# Patient Record
Sex: Female | Born: 1990 | Race: White | Hispanic: No | Marital: Single | Smoking: Never smoker
Health system: Southern US, Community
[De-identification: ages and names within clinical notes are randomized; demographics above are authoritative.]

## PROBLEM LIST (undated history)

## (undated) ENCOUNTER — Inpatient Hospital Stay: Payer: Self-pay

## (undated) DIAGNOSIS — N83209 Unspecified ovarian cyst, unspecified side: Secondary | ICD-10-CM

## (undated) DIAGNOSIS — K219 Gastro-esophageal reflux disease without esophagitis: Secondary | ICD-10-CM

## (undated) DIAGNOSIS — D649 Anemia, unspecified: Secondary | ICD-10-CM

## (undated) DIAGNOSIS — S0291XA Unspecified fracture of skull, initial encounter for closed fracture: Secondary | ICD-10-CM

## (undated) DIAGNOSIS — Z8781 Personal history of (healed) traumatic fracture: Secondary | ICD-10-CM

## (undated) DIAGNOSIS — J45909 Unspecified asthma, uncomplicated: Secondary | ICD-10-CM

## (undated) HISTORY — PX: BIOPSY THYROID: PRO38

## (undated) HISTORY — PX: DENTAL SURGERY: SHX609

---
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
## 2009-06-18 ENCOUNTER — Emergency Department: Payer: Self-pay | Admitting: Emergency Medicine

## 2009-07-27 ENCOUNTER — Ambulatory Visit: Payer: Self-pay | Admitting: Family Medicine

## 2009-11-17 ENCOUNTER — Observation Stay: Payer: Self-pay

## 2009-12-05 ENCOUNTER — Inpatient Hospital Stay: Payer: Self-pay

## 2010-03-20 ENCOUNTER — Emergency Department (HOSPITAL_COMMUNITY): Admission: EM | Admit: 2010-03-20 | Discharge: 2010-03-20 | Payer: Self-pay | Admitting: Emergency Medicine

## 2010-04-17 ENCOUNTER — Emergency Department (HOSPITAL_COMMUNITY): Admission: EM | Admit: 2010-04-17 | Discharge: 2010-04-17 | Payer: Self-pay | Admitting: Emergency Medicine

## 2010-11-18 ENCOUNTER — Inpatient Hospital Stay (HOSPITAL_COMMUNITY)
Admission: AD | Admit: 2010-11-18 | Discharge: 2010-11-19 | Payer: Self-pay | Source: Home / Self Care | Attending: Obstetrics & Gynecology | Admitting: Obstetrics & Gynecology

## 2010-11-21 LAB — CBC
MCH: 30.7 pg (ref 26.0–34.0)
MCHC: 34 g/dL (ref 30.0–36.0)
MCV: 90.3 fL (ref 78.0–100.0)
Platelets: 162 10*3/uL (ref 150–400)
RBC: 3.71 MIL/uL — ABNORMAL LOW (ref 3.87–5.11)

## 2011-01-15 LAB — CBC
Platelets: 238 10*3/uL (ref 150–400)
RBC: 4.14 MIL/uL (ref 3.87–5.11)
WBC: 6.6 10*3/uL (ref 4.0–10.5)

## 2011-01-15 LAB — COMPREHENSIVE METABOLIC PANEL
ALT: 25 U/L (ref 0–35)
AST: 21 U/L (ref 0–37)
Albumin: 3.7 g/dL (ref 3.5–5.2)
Alkaline Phosphatase: 108 U/L (ref 39–117)
Chloride: 105 mEq/L (ref 96–112)
GFR calc Af Amer: >60 mL/min (ref >60)
Potassium: 3.8 mEq/L (ref 3.5–5.1)
Total Bilirubin: 0.5 mg/dL (ref 0.3–1.2)

## 2011-01-15 LAB — URINE MICROSCOPIC-ADD ON

## 2011-01-15 LAB — DIFFERENTIAL
Basophils Absolute: 0.1 10*3/uL (ref 0.0–0.1)
Basophils Relative: 1 % (ref 0–1)
Eosinophils Absolute: 0.1 10*3/uL (ref 0.0–0.7)
Eosinophils Relative: 1 % (ref 0–5)
Monocytes Absolute: 0.4 10*3/uL (ref 0.1–1.0)

## 2011-01-15 LAB — URINALYSIS, ROUTINE W REFLEX MICROSCOPIC
Bilirubin Urine: NEGATIVE
Glucose, UA: NEGATIVE mg/dL
Protein, ur: NEGATIVE mg/dL
Specific Gravity, Urine: 1.025 (ref 1.005–1.030)

## 2011-02-12 LAB — CBC
Hemoglobin: 13.3 g/dL (ref 12.0–15.0)
MCHC: 35.1 g/dL (ref 30.0–36.0)
RDW: 13.7 % (ref 11.5–15.5)

## 2011-02-12 LAB — COMPREHENSIVE METABOLIC PANEL
ALT: 13 U/L (ref 0–35)
Calcium: 8.9 mg/dL (ref 8.4–10.5)
Glucose, Bld: 102 mg/dL — ABNORMAL HIGH (ref 70–99)
Sodium: 137 mEq/L (ref 135–145)
Total Protein: 6.9 g/dL (ref 6.0–8.3)

## 2011-02-12 LAB — DIFFERENTIAL
Eosinophils Absolute: 0.2 10*3/uL (ref 0.0–0.7)
Lymphs Abs: 0.4 10*3/uL — ABNORMAL LOW (ref 0.7–4.0)
Monocytes Relative: 7 % (ref 3–12)
Neutro Abs: 2.6 10*3/uL (ref 1.7–7.7)
Neutrophils Relative %: 77 % (ref 43–77)

## 2011-11-22 ENCOUNTER — Emergency Department: Payer: Self-pay | Admitting: Emergency Medicine

## 2011-11-22 LAB — URINALYSIS, COMPLETE
Bacteria: NONE SEEN
WBC UR: 96 /HPF (ref 0–5)

## 2011-11-22 LAB — BASIC METABOLIC PANEL
Calcium, Total: 8.3 mg/dL — ABNORMAL LOW (ref 8.5–10.1)
EGFR (African American): >60
Glucose: 85 mg/dL (ref 65–99)
Osmolality: 285 (ref 275–301)
Potassium: 4 mmol/L (ref 3.5–5.1)

## 2011-11-22 LAB — HCG, QUANTITATIVE, PREGNANCY: Beta Hcg, Quant.: <1 m[IU]/mL — ABNORMAL LOW

## 2011-11-22 LAB — PREGNANCY, URINE: Pregnancy Test, Urine: NEGATIVE m[IU]/mL

## 2011-12-20 ENCOUNTER — Emergency Department: Payer: Self-pay | Admitting: Emergency Medicine

## 2012-03-07 ENCOUNTER — Encounter (HOSPITAL_COMMUNITY): Payer: Self-pay | Admitting: *Deleted

## 2012-03-07 ENCOUNTER — Emergency Department (HOSPITAL_COMMUNITY)
Admission: EM | Admit: 2012-03-07 | Discharge: 2012-03-08 | Disposition: A | Discharge status: Home / Self Care | Payer: Self-pay | Attending: Emergency Medicine | Admitting: Emergency Medicine

## 2012-03-07 DIAGNOSIS — N760 Acute vaginitis: Secondary | ICD-10-CM | POA: Insufficient documentation

## 2012-03-07 DIAGNOSIS — A499 Bacterial infection, unspecified: Secondary | ICD-10-CM | POA: Insufficient documentation

## 2012-03-07 DIAGNOSIS — N72 Inflammatory disease of cervix uteri: Secondary | ICD-10-CM | POA: Insufficient documentation

## 2012-03-07 DIAGNOSIS — B9689 Other specified bacterial agents as the cause of diseases classified elsewhere: Secondary | ICD-10-CM | POA: Insufficient documentation

## 2012-03-07 DIAGNOSIS — R112 Nausea with vomiting, unspecified: Secondary | ICD-10-CM | POA: Insufficient documentation

## 2012-03-07 HISTORY — DX: Anemia, unspecified: D64.9

## 2012-03-07 HISTORY — DX: Unspecified ovarian cyst, unspecified side: N83.209

## 2012-03-07 LAB — POCT PREGNANCY, URINE: Preg Test, Ur: NEGATIVE

## 2012-03-07 LAB — DIFFERENTIAL
Basophils Absolute: 0 10*3/uL (ref 0.0–0.1)
Eosinophils Absolute: 0.1 10*3/uL (ref 0.0–0.7)
Lymphocytes Relative: 40 % (ref 12–46)
Lymphs Abs: 2.2 10*3/uL (ref 0.7–4.0)
Neutrophils Relative %: 52 % (ref 43–77)

## 2012-03-07 LAB — BASIC METABOLIC PANEL
Calcium: 8.9 mg/dL (ref 8.4–10.5)
GFR calc non Af Amer: >90 mL/min (ref >90)
Sodium: 138 mEq/L (ref 135–145)

## 2012-03-07 LAB — URINALYSIS, ROUTINE W REFLEX MICROSCOPIC
Glucose, UA: NEGATIVE mg/dL
Hgb urine dipstick: NEGATIVE
Specific Gravity, Urine: 1.01 (ref 1.005–1.030)
pH: 7.5 (ref 5.0–8.0)

## 2012-03-07 LAB — WET PREP, GENITAL
Trich, Wet Prep: NONE SEEN
Yeast Wet Prep HPF POC: NONE SEEN

## 2012-03-07 LAB — CBC
Platelets: 184 10*3/uL (ref 150–400)
RBC: 4.06 MIL/uL (ref 3.87–5.11)
RDW: 14.7 % (ref 11.5–15.5)
WBC: 5.5 10*3/uL (ref 4.0–10.5)

## 2012-03-07 MED ORDER — OXYCODONE-ACETAMINOPHEN 5-325 MG PO TABS
2.0000 | ORAL_TABLET | Freq: Once | ORAL | Status: AC
  Administered 2012-03-07: 2 via ORAL
  Filled 2012-03-07: qty 2

## 2012-03-07 MED ORDER — ONDANSETRON 8 MG PO TBDP
8.0000 mg | ORAL_TABLET | Freq: Once | ORAL | Status: AC
  Administered 2012-03-07: 8 mg via ORAL
  Filled 2012-03-07: qty 1
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
## 2012-03-08 MED ORDER — AZITHROMYCIN 250 MG PO TABS
1000.0000 mg | ORAL_TABLET | Freq: Once | ORAL | Status: AC
  Administered 2012-03-08: 1000 mg via ORAL
  Filled 2012-03-08: qty 4

## 2012-03-08 MED ORDER — AZITHROMYCIN 250 MG PO TABS
ORAL_TABLET | ORAL | Status: AC
  Filled 2012-03-08: qty 1

## 2012-03-08 MED ORDER — CEFTRIAXONE SODIUM 250 MG IJ SOLR
250.0000 mg | Freq: Once | INTRAMUSCULAR | Status: AC
  Administered 2012-03-08: 250 mg via INTRAMUSCULAR
  Filled 2012-03-08: qty 250

## 2012-03-08 MED ORDER — LIDOCAINE HCL (PF) 1 % IJ SOLN
INTRAMUSCULAR | Status: AC
  Administered 2012-03-08: 01:00:00
  Filled 2012-03-08: qty 5

## 2012-03-08 MED ORDER — METRONIDAZOLE 500 MG PO TABS: 500.0000 mg | ORAL_TABLET | Freq: Two times a day (BID) | ORAL | Status: AC

## 2012-03-08 NOTE — Discharge Instructions (Signed)
Bacterial Vaginosis Bacterial vaginosis (BV) is a vaginal infection where the normal balance of bacteria in the vagina is disrupted. The normal balance is then replaced by an overgrowth of certain bacteria. There are several different kinds of bacteria that can cause BV. BV is the most common vaginal infection in women of childbearing age. CAUSES   The cause of BV is not fully understood. BV develops when there is an increase or imbalance of harmful bacteria.   Some activities or behaviors can upset the normal balance of bacteria in the vagina and put women at increased risk including:   Having a new sex partner or multiple sex partners.   Douching.   Using an intrauterine device (IUD) for contraception.   It is not clear what role sexual activity plays in the development of BV. However, women that have never had sexual intercourse are rarely infected with BV.  Women do not get BV from toilet seats, bedding, swimming pools or from touching objects around them.  SYMPTOMS   Grey vaginal discharge.   A fish-like odor with discharge, especially after sexual intercourse.   Itching or burning of the vagina and vulva.   Burning or pain with urination.   Some women have no signs or symptoms at all.  DIAGNOSIS  Your caregiver must examine the vagina for signs of BV. Your caregiver will perform lab tests and look at the sample of vaginal fluid through a microscope. They will look for bacteria and abnormal cells (clue cells), a pH test higher than 4.5, and a positive amine test all associated with BV.  RISKS AND COMPLICATIONS   Pelvic inflammatory disease (PID).   Infections following gynecology surgery.   Developing HIV.   Developing herpes virus.  TREATMENT  Sometimes BV will clear up without treatment. However, all women with symptoms of BV should be treated to avoid complications, especially if gynecology surgery is planned. Female partners generally do not need to be treated. However,  BV may spread between female sex partners so treatment is helpful in preventing a recurrence of BV.   BV may be treated with antibiotics. The antibiotics come in either pill or vaginal cream forms. Either can be used with nonpregnant or pregnant women, but the recommended dosages differ. These antibiotics are not harmful to the baby.   BV can recur after treatment. If this happens, a second round of antibiotics will often be prescribed.   Treatment is important for pregnant women. If not treated, BV can cause a premature delivery, especially for a pregnant woman who had a premature birth in the past. All pregnant women who have symptoms of BV should be checked and treated.   For chronic reoccurrence of BV, treatment with a type of prescribed gel vaginally twice a week is helpful.  HOME CARE INSTRUCTIONS   Finish all medication as directed by your caregiver.   Do not have sex until treatment is completed.   Tell your sexual partner that you have a vaginal infection. They should see their caregiver and be treated if they have problems, such as a mild rash or itching.   Practice safe sex. Use condoms. Only have 1 sex partner.  PREVENTION  Basic prevention steps can help reduce the risk of upsetting the natural balance of bacteria in the vagina and developing BV:  Do not have sexual intercourse (be abstinent).   Do not douche.   Use all of the medicine prescribed for treatment of BV, even if the signs and symptoms go away.     Tell your sex partner if you have BV. That way, they can be treated, if needed, to prevent reoccurrence.  SEEK MEDICAL CARE IF:   Your symptoms are not improving after 3 days of treatment.   You have increased discharge, pain, or fever.  MAKE SURE YOU:   Understand these instructions.   Will watch your condition.   Will get help right away if you are not doing well or get worse.  FOR MORE INFORMATION  Division of STD Prevention (DSTDP), Centers for Disease  Control and Prevention: [employer] American Social Health Association (ASHA): www.ashastd.org  Document Released: 10/15/2005 Document Revised: 10/04/2011 Document Reviewed: 04/07/2009 [facility] Patient Information 2012 [place], [place].Cervicitis Cervicitis is a soreness and swelling (inflammation) of the cervix. Your cervix is located at the bottom of your uterus which opens up to the vagina.  CAUSES   Sexually transmitted infections (STIs).   Allergic reaction.   Medicines or birth control devices that are put in the vagina.   Injury to the cervix.   Bacterial infections.  SYMPTOMS  There may be no symptoms. If symptoms occur, they may include:  Grey, white, yellow, or bad smelling vaginal discharge.   Pain or itching of the area outside the vagina.   Painful sexual intercourse.   Lower abdominal or lower back pain, especially during intercourse.   Frequent urination.   Abnormal vaginal bleeding between periods, after sexual intercourse, or after menopause.   Pressure or a heavy feeling in the pelvis.  DIAGNOSIS  Diagnosis is made after a pelvic exam. Other tests may include:  Examination of any discharge under a microscope (wet prep).   A Pap test.  TREATMENT  Treatment will depend on the cause of cervicitis. If it is caused by an STI, both you and your partner will need to be treated. Antibiotic medicines will be given. HOME CARE INSTRUCTIONS   Do not have sexual intercourse until your caregiver says it is okay.   Do not have sexual intercourse until your partner has been treated if your cervicitis is caused by an STI.   Take your antibiotics as directed. Finish them even if you start to feel better.  SEEK IMMEDIATE MEDICAL CARE IF:   Your symptoms come back.   You have a fever.   You experience any problems that may be related to the medicine you are taking.  MAKE SURE YOU:   Understand these instructions.   Will watch your condition.   Will get  help right away if you are not doing well or get worse.  Document Released: 10/15/2005 Document Revised: 10/04/2011 Document Reviewed: 05/14/2011 [facility] Patient Information 2012 [place], [place].   As discussed you may call here in 3-4 days to inquire about the results of your gonorrhea and Chlamydia cultures if you have not heard from [place].  You have been treated both of these infections tonight.  Complete the entire  Flagyl  Prescription which was prescribed to tonight for your bacterial vaginosis infection.
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
## 2012-03-08 NOTE — ED Provider Notes (Signed)
Medical screening examination/treatment/procedure(s) were conducted as a shared visit with non-physician practitioner(s) and myself.  I personally evaluated the patient during the encounter.  Pelvic exam per PA.  History and physical consistent with cervicitis and bacterial vaginosis  Donnetta Hutching, MD 03/08/12 1536

## 2012-03-09 LAB — RPR: RPR Ser Ql: NONREACTIVE

## 2012-03-10 LAB — GC/CHLAMYDIA PROBE AMP, GENITAL
Chlamydia, DNA Probe: NEGATIVE
GC Probe Amp, Genital: NEGATIVE

## 2012-08-25 ENCOUNTER — Emergency Department: Payer: Self-pay | Admitting: Emergency Medicine

## 2012-08-25 LAB — CBC
HCT: 34.9 % — ABNORMAL LOW (ref 35.0–47.0)
HGB: 12.2 g/dL (ref 12.0–16.0)
MCHC: 34.8 g/dL (ref 32.0–36.0)
RBC: 4.03 10*6/uL (ref 3.80–5.20)
WBC: 5.5 10*3/uL (ref 3.6–11.0)

## 2012-08-27 ENCOUNTER — Emergency Department: Payer: Self-pay | Admitting: Emergency Medicine

## 2012-08-27 LAB — CBC WITH DIFFERENTIAL/PLATELET
Basophil #: 0.1 10*3/uL (ref 0.0–0.1)
HCT: 34.7 % — ABNORMAL LOW (ref 35.0–47.0)
Lymphocyte #: 1.2 10*3/uL (ref 1.0–3.6)
MCH: 30.1 pg (ref 26.0–34.0)
MCV: 86 fL (ref 80–100)
Monocyte #: 0.4 x10 3/mm (ref 0.2–0.9)
Monocyte %: 9.9 %
Neutrophil #: 2.5 10*3/uL (ref 1.4–6.5)
Platelet: 176 10*3/uL (ref 150–440)
RDW: 14.5 % (ref 11.5–14.5)

## 2012-08-27 LAB — BASIC METABOLIC PANEL
EGFR (African American): >60
EGFR (Non-African Amer.): >60
Glucose: 103 mg/dL — ABNORMAL HIGH (ref 65–99)
Osmolality: 279 (ref 275–301)
Potassium: 3.4 mmol/L — ABNORMAL LOW (ref 3.5–5.1)

## 2013-02-18 ENCOUNTER — Emergency Department: Payer: Self-pay | Admitting: Emergency Medicine

## 2013-02-18 LAB — COMPREHENSIVE METABOLIC PANEL
BUN: 11 mg/dL (ref 7–18)
Bilirubin,Total: 0.7 mg/dL (ref 0.2–1.0)
Calcium, Total: 8.6 mg/dL (ref 8.5–10.1)
Chloride: 105 mmol/L (ref 98–107)
Co2: 30 mmol/L (ref 21–32)
EGFR (Non-African Amer.): >60
Osmolality: 279 (ref 275–301)
Potassium: 3.7 mmol/L (ref 3.5–5.1)
SGPT (ALT): 27 U/L (ref 12–78)

## 2013-02-18 LAB — CBC
HCT: 37.8 % (ref 35.0–47.0)
MCH: 29 pg (ref 26.0–34.0)
RBC: 4.38 10*6/uL (ref 3.80–5.20)

## 2013-02-18 LAB — URINALYSIS, COMPLETE
Bilirubin,UR: NEGATIVE
Ketone: NEGATIVE
Nitrite: NEGATIVE
RBC,UR: 10 /HPF (ref 0–5)
Squamous Epithelial: 8

## 2013-02-18 LAB — LIPASE, BLOOD: Lipase: 50 U/L — ABNORMAL LOW (ref 73–393)

## 2013-06-01 ENCOUNTER — Emergency Department: Payer: Self-pay | Admitting: Emergency Medicine

## 2013-06-15 ENCOUNTER — Emergency Department: Payer: Self-pay | Admitting: Emergency Medicine

## 2014-02-05 DIAGNOSIS — O99210 Obesity complicating pregnancy, unspecified trimester: Secondary | ICD-10-CM | POA: Insufficient documentation

## 2014-09-03 ENCOUNTER — Emergency Department: Payer: Self-pay | Admitting: Emergency Medicine

## 2014-09-03 LAB — CBC
HCT: 38 % (ref 35.0–47.0)
HGB: 12.7 g/dL (ref 12.0–16.0)
MCH: 29.7 pg (ref 26.0–34.0)
MCHC: 33.4 g/dL (ref 32.0–36.0)
MCV: 89 fL (ref 80–100)
PLATELETS: 211 10*3/uL (ref 150–440)
RBC: 4.27 10*6/uL (ref 3.80–5.20)
RDW: 13.7 % (ref 11.5–14.5)
WBC: 7.7 10*3/uL (ref 3.6–11.0)

## 2015-06-01 ENCOUNTER — Other Ambulatory Visit: Payer: Self-pay

## 2015-06-01 ENCOUNTER — Encounter: Payer: Self-pay | Admitting: Emergency Medicine

## 2015-06-01 ENCOUNTER — Emergency Department
Admission: EM | Admit: 2015-06-01 | Discharge: 2015-06-01 | Disposition: A | Discharge status: Home / Self Care | Payer: Medicaid Other | Attending: Emergency Medicine | Admitting: Emergency Medicine

## 2015-06-01 ENCOUNTER — Emergency Department: Payer: Medicaid Other

## 2015-06-01 DIAGNOSIS — R079 Chest pain, unspecified: Secondary | ICD-10-CM | POA: Insufficient documentation

## 2015-06-01 DIAGNOSIS — Z79899 Other long term (current) drug therapy: Secondary | ICD-10-CM | POA: Insufficient documentation

## 2015-06-01 DIAGNOSIS — Z880 Allergy status to penicillin: Secondary | ICD-10-CM | POA: Insufficient documentation

## 2015-06-01 DIAGNOSIS — K2970 Gastritis, unspecified, without bleeding: Secondary | ICD-10-CM | POA: Insufficient documentation

## 2015-06-01 LAB — CBC
HEMATOCRIT: 38.2 % (ref 35.0–47.0)
HEMOGLOBIN: 12.8 g/dL (ref 12.0–16.0)
MCH: 26.8 pg (ref 26.0–34.0)
MCHC: 33.6 g/dL (ref 32.0–36.0)
MCV: 79.7 fL — AB (ref 80.0–100.0)
Platelets: 234 10*3/uL (ref 150–440)
RBC: 4.79 MIL/uL (ref 3.80–5.20)
RDW: 15.9 % — ABNORMAL HIGH (ref 11.5–14.5)
WBC: 7.4 10*3/uL (ref 3.6–11.0)

## 2015-06-01 LAB — BASIC METABOLIC PANEL
ANION GAP: 8 (ref 5–15)
BUN: 15 mg/dL (ref 6–20)
CHLORIDE: 102 mmol/L (ref 101–111)
CO2: 27 mmol/L (ref 22–32)
Calcium: 8.3 mg/dL — ABNORMAL LOW (ref 8.9–10.3)
Creatinine, Ser: 0.87 mg/dL (ref 0.44–1.00)
GFR calc Af Amer: >60 mL/min (ref >60)
GLUCOSE: 94 mg/dL (ref 65–99)
Potassium: 3.8 mmol/L (ref 3.5–5.1)
SODIUM: 137 mmol/L (ref 135–145)

## 2015-06-01 LAB — FIBRIN DERIVATIVES D-DIMER (ARMC ONLY): Fibrin derivatives D-dimer (ARMC): 457 (ref 0–499)

## 2015-06-01 LAB — TROPONIN I: Troponin I: <0.03 ng/mL (ref <0.031)

## 2015-06-01 MED ORDER — RANITIDINE HCL 150 MG PO TABS
150.0000 mg | ORAL_TABLET | Freq: Two times a day (BID) | ORAL | Status: DC
Start: 2015-06-01 — End: 2019-08-27

## 2015-06-01 MED ORDER — GI COCKTAIL ~~LOC~~
ORAL | Status: AC
  Administered 2015-06-01: 30 mL via ORAL
  Filled 2015-06-01: qty 30

## 2015-06-01 MED ORDER — GI COCKTAIL ~~LOC~~
30.0000 mL | Freq: Once | ORAL | Status: AC
  Administered 2015-06-01: 30 mL via ORAL

## 2015-06-01 MED ORDER — SUCRALFATE 1 G PO TABS: 1.0000 g | ORAL_TABLET | Freq: Four times a day (QID) | ORAL | Status: DC

## 2015-06-01 NOTE — ED Notes (Signed)

## 2015-06-01 NOTE — ED Notes (Signed)
States she developed upper chest pain about 1 hr prior.Marland Kitchen Pos nausea. Pain increases with inspiration

## 2015-06-01 NOTE — ED Provider Notes (Signed)
[facility] Emergency Department Provider Note    ____________________________________________  Time seen: 9  I have reviewed the triage vital signs and the nursing notes.   HISTORY  Chief Complaint Chest Pain   History limited by: Not Limited   HPI Bianca Hayes is a 24 y.o. female who presents to the emergency department today because of concerns of chest pain. She states that the chest pain started all of a sudden today. She describes it as sharp. It started roughly 1 hour prior to arrival. It is located in the central chest. There is no radiation. She states it is worse with deep breaths. She feels slightly short of breath. She does not have a cough. No recent fevers.  Past Medical History  Diagnosis Date  . Ovarian cyst   . Anemia     There are no active problems to display for this patient.   History reviewed. No pertinent past surgical history.  Current Outpatient Rx  Name  Route  Sig  Dispense  Refill  . UNKNOWN TO PATIENT   Oral   Take 1 tablet by mouth daily.           Allergies Iodine; Bactrim; and Penicillins  No family history on file.  Social History History  Substance Use Topics  . Smoking status: Never Smoker   . Smokeless tobacco: Not on file  . Alcohol Use: No    Review of Systems   Constitutional: Negative for fever. Cardiovascular: Positive for chest pain. Respiratory: Positive for shortness of breath. Gastrointestinal: Negative for abdominal pain, vomiting and diarrhea. Genitourinary: Negative for dysuria. Musculoskeletal: Negative for back pain. Skin: Negative for rash. Neurological: Negative for headaches, focal weakness or numbness.  10-point ROS otherwise negative.  ____________________________________________   PHYSICAL EXAM:  VITAL SIGNS: ED Triage Vitals  Enc Vitals Group     BP 06/01/15 1825 126/76 mmHg     Pulse Rate 06/01/15 1825 100     Resp 06/01/15 1825 20     Temp  06/01/15 1825 98.2 F (36.8 C)     Temp Source 06/01/15 1825 Oral     SpO2 06/01/15 1825 100 %     Weight 06/01/15 1825 330 lb (149.687 kg)     Height 06/01/15 1825  (1.753 m)     Head Cir --      Peak Flow --      Pain Score 06/01/15 1822 9   Constitutional: Alert and oriented. Well appearing and in no distress. Eyes: Conjunctivae are normal. PERRL. Normal extraocular movements. ENT   Head: Normocephalic and atraumatic.   Nose: No congestion/rhinnorhea.   Mouth/Throat: Mucous membranes are moist.   Neck: No stridor. Hematological/Lymphatic/Immunilogical: No cervical lymphadenopathy. Cardiovascular: Normal rate, regular rhythm.  No murmurs, rubs, or gallops. Respiratory: Normal respiratory effort without tachypnea nor retractions. Breath sounds are clear and equal bilaterally. No wheezes/rales/rhonchi. Gastrointestinal: Soft and nontender. No distention. There is no CVA tenderness. Genitourinary: Deferred Musculoskeletal: Normal range of motion in all extremities. No joint effusions.  No lower extremity tenderness nor edema. Neurologic:  Normal speech and language. No gross focal neurologic deficits are appreciated. Speech is normal.  Skin:  Skin is warm, dry and intact. No rash noted. Psychiatric: Mood and affect are normal. Speech and behavior are normal. Patient exhibits appropriate insight and judgment.  ____________________________________________    LABS (pertinent positives/negatives)  Labs Reviewed  BASIC METABOLIC PANEL - Abnormal; Notable for the following:    Calcium 8.3 (*)  All other components within normal limits  CBC - Abnormal; Notable for the following:    MCV 79.7 (*)    RDW 15.9 (*)    All other components within normal limits  TROPONIN I  FIBRIN DERIVATIVES D-DIMER ([facility] ONLY)     ____________________________________________   EKG  I, Phineas Semen, attending physician, personally viewed and interpreted this EKG  EKG Time:  1823 Rate: 94 Rhythm: normal sinus rhythm Axis: normal Intervals: normal QRS: normal ST changes: no st elevation    ____________________________________________    RADIOLOGY  CXR IMPRESSION: No active cardiopulmonary disease.  ____________________________________________   PROCEDURES  Procedure(s) performed: None  Critical Care performed: No  ____________________________________________   INITIAL IMPRESSION / ASSESSMENT AND PLAN / ED COURSE  Pertinent labs & imaging results that were available during my care of the patient were reviewed by me and considered in my medical decision making (see chart for details).  Patient presented the emergency department today with acute onset of sharp chest pain. Blood work and EKG negative including d-dimer. Chest x-ray without any concerning findings. Patient did state that she felt relief after GI cocktail. Thus I think gastritis and reflux likely a discuss the patient's symptoms. Will plan on discharging home with medications.  ____________________________________________   FINAL CLINICAL IMPRESSION(S) / ED DIAGNOSES  Final diagnoses:  Chest pain, unspecified chest pain type  Gastritis     Phineas Semen, MD 06/01/15 2135

## 2015-06-01 NOTE — Discharge Instructions (Signed)
Please seek medical attention for any high fevers, chest pain, shortness of breath, change in behavior, persistent vomiting, bloody stool or any other new or concerning symptoms. ° °Gastritis, Adult °Gastritis is soreness and swelling (inflammation) of the lining of the stomach. Gastritis can develop as a sudden onset (acute) or long-term (chronic) condition. If gastritis is not treated, it can lead to stomach bleeding and ulcers. °CAUSES  °Gastritis occurs when the stomach lining is weak or damaged. Digestive juices from the stomach then inflame the weakened stomach lining. The stomach lining may be weak or damaged due to viral or bacterial infections. One common bacterial infection is the Helicobacter pylori infection. Gastritis can also result from excessive alcohol consumption, taking certain medicines, or having too much acid in the stomach.  °SYMPTOMS  °In some cases, there are no symptoms. When symptoms are present, they may include: °· Pain or a burning sensation in the upper abdomen. °· Nausea. °· Vomiting. °· An uncomfortable feeling of fullness after eating. °DIAGNOSIS  °Your caregiver may suspect you have gastritis based on your symptoms and a physical exam. To determine the cause of your gastritis, your caregiver may perform the following: °· Blood or stool tests to check for the H pylori bacterium. °· Gastroscopy. A thin, flexible tube (endoscope) is passed down the esophagus and into the stomach. The endoscope has a light and camera on the end. Your caregiver uses the endoscope to view the inside of the stomach. °· Taking a tissue sample (biopsy) from the stomach to examine under a microscope. °TREATMENT  °Depending on the cause of your gastritis, medicines may be prescribed. If you have a bacterial infection, such as an H pylori infection, antibiotics may be given. If your gastritis is caused by too much acid in the stomach, H2 blockers or antacids may be given. Your caregiver may recommend that you  stop taking aspirin, ibuprofen, or other nonsteroidal anti-inflammatory drugs (NSAIDs). °HOME CARE INSTRUCTIONS °· Only take over-the-counter or prescription medicines as directed by your caregiver. °· If you were given antibiotic medicines, take them as directed. Finish them even if you start to feel better. °· Drink enough fluids to keep your urine clear or pale yellow. °· Avoid foods and drinks that make your symptoms worse, such as: °¨ Caffeine or alcoholic drinks. °¨ Chocolate. °¨ Peppermint or mint flavorings. °¨ Garlic and onions. °¨ Spicy foods. °¨ Citrus fruits, such as oranges, lemons, or limes. °¨ Tomato-based foods such as sauce, chili, salsa, and pizza. °¨ Fried and fatty foods. °· Eat small, frequent meals instead of large meals. °SEEK IMMEDIATE MEDICAL CARE IF:  °· You have black or dark red stools. °· You vomit blood or material that looks like coffee grounds. °· You are unable to keep fluids down. °· Your abdominal pain gets worse. °· You have a fever. °· You do not feel better after 1 week. °· You have any other questions or concerns. °MAKE SURE YOU: °· Understand these instructions. °· Will watch your condition. °· Will get help right away if you are not doing well or get worse. °Document Released: 10/09/2001 Document Revised: 04/15/2012 Document Reviewed: 11/28/2011 °[employer]® Patient Information ©2015 [employer]. This information is not intended to replace advice given to you by your health care provider. Make sure you discuss any questions you have with your health care provider. ° °

## 2016-04-07 ENCOUNTER — Emergency Department
Admission: EM | Admit: 2016-04-07 | Discharge: 2016-04-07 | Disposition: A | Discharge status: Home / Self Care | Payer: Medicaid Other | Attending: Student | Admitting: Student

## 2016-04-07 ENCOUNTER — Emergency Department: Payer: Medicaid Other

## 2016-04-07 ENCOUNTER — Encounter: Payer: Self-pay | Admitting: Emergency Medicine

## 2016-04-07 DIAGNOSIS — R1110 Vomiting, unspecified: Secondary | ICD-10-CM

## 2016-04-07 DIAGNOSIS — Z79899 Other long term (current) drug therapy: Secondary | ICD-10-CM | POA: Insufficient documentation

## 2016-04-07 DIAGNOSIS — R112 Nausea with vomiting, unspecified: Secondary | ICD-10-CM | POA: Diagnosis not present

## 2016-04-07 DIAGNOSIS — R197 Diarrhea, unspecified: Secondary | ICD-10-CM | POA: Insufficient documentation

## 2016-04-07 DIAGNOSIS — N939 Abnormal uterine and vaginal bleeding, unspecified: Secondary | ICD-10-CM | POA: Insufficient documentation

## 2016-04-07 LAB — CBC WITH DIFFERENTIAL/PLATELET
BASOS ABS: 0.1 10*3/uL (ref 0–0.1)
BASOS PCT: 1 %
EOS PCT: 2 %
Eosinophils Absolute: 0.1 10*3/uL (ref 0–0.7)
HCT: 38 % (ref 35.0–47.0)
Hemoglobin: 12.7 g/dL (ref 12.0–16.0)
LYMPHS PCT: 28 %
Lymphs Abs: 1.9 10*3/uL (ref 1.0–3.6)
MCH: 27.7 pg (ref 26.0–34.0)
MCHC: 33.3 g/dL (ref 32.0–36.0)
MCV: 83.2 fL (ref 80.0–100.0)
Monocytes Absolute: 0.5 10*3/uL (ref 0.2–0.9)
Monocytes Relative: 8 %
Neutro Abs: 4.3 10*3/uL (ref 1.4–6.5)
Neutrophils Relative %: 61 %
PLATELETS: 214 10*3/uL (ref 150–440)
RBC: 4.57 MIL/uL (ref 3.80–5.20)
RDW: 15.6 % — ABNORMAL HIGH (ref 11.5–14.5)
WBC: 6.9 10*3/uL (ref 3.6–11.0)

## 2016-04-07 LAB — URINALYSIS COMPLETE WITH MICROSCOPIC (ARMC ONLY)
BILIRUBIN URINE: NEGATIVE
Bacteria, UA: NONE SEEN
Glucose, UA: NEGATIVE mg/dL
KETONES UR: NEGATIVE mg/dL
LEUKOCYTES UA: NEGATIVE
NITRITE: NEGATIVE
PH: 6 (ref 5.0–8.0)
PROTEIN: NEGATIVE mg/dL
SPECIFIC GRAVITY, URINE: 1.025 (ref 1.005–1.030)

## 2016-04-07 LAB — LIPASE, BLOOD: LIPASE: 17 U/L (ref 11–51)

## 2016-04-07 LAB — COMPREHENSIVE METABOLIC PANEL
ALT: 15 U/L (ref 14–54)
ANION GAP: 6 (ref 5–15)
AST: 15 U/L (ref 15–41)
Albumin: 4 g/dL (ref 3.5–5.0)
Alkaline Phosphatase: 88 U/L (ref 38–126)
BUN: 19 mg/dL (ref 6–20)
CHLORIDE: 108 mmol/L (ref 101–111)
CO2: 26 mmol/L (ref 22–32)
CREATININE: 0.79 mg/dL (ref 0.44–1.00)
Calcium: 8.5 mg/dL — ABNORMAL LOW (ref 8.9–10.3)
Glucose, Bld: 92 mg/dL (ref 65–99)
POTASSIUM: 3.4 mmol/L — AB (ref 3.5–5.1)
SODIUM: 140 mmol/L (ref 135–145)
Total Bilirubin: 0.5 mg/dL (ref 0.3–1.2)
Total Protein: 6.9 g/dL (ref 6.5–8.1)

## 2016-04-07 LAB — WET PREP, GENITAL
Clue Cells Wet Prep HPF POC: NONE SEEN
Sperm: NONE SEEN
Trich, Wet Prep: NONE SEEN
WBC, Wet Prep HPF POC: NONE SEEN
Yeast Wet Prep HPF POC: NONE SEEN

## 2016-04-07 LAB — HCG, QUANTITATIVE, PREGNANCY

## 2016-04-07 LAB — CHLAMYDIA/NGC RT PCR (ARMC ONLY)
Chlamydia Tr: NOT DETECTED
N gonorrhoeae: NOT DETECTED

## 2016-04-07 MED ORDER — ONDANSETRON HCL 4 MG/2ML IJ SOLN
4.0000 mg | Freq: Once | INTRAMUSCULAR | Status: AC
  Administered 2016-04-07: 4 mg via INTRAVENOUS
  Filled 2016-04-07: qty 2

## 2016-04-07 MED ORDER — SODIUM CHLORIDE 0.9 % IV BOLUS (SEPSIS)
1000.0000 mL | Freq: Once | INTRAVENOUS | Status: AC
  Administered 2016-04-07: 1000 mL via INTRAVENOUS

## 2016-04-07 NOTE — ED Notes (Signed)
Report given to April B, RN.  

## 2016-04-07 NOTE — ED Notes (Signed)
Nausea/vomiting on and off for about 3 weeks    Also is having heavy vaginal bleeding

## 2016-04-07 NOTE — ED Notes (Signed)
Pt unhooked so that she could go to the bathroom. Pt ambulatory without difficulty.

## 2016-04-07 NOTE — ED Notes (Signed)
Pt assited up to restroom to void. Pt appears in no acute distress. Call bell provided to right side.

## 2016-04-07 NOTE — ED Provider Notes (Signed)
[facility] Emergency Department Provider Note   ____________________________________________  Time seen: Approximately 5:35 PM  I have reviewed the triage vital signs and the nursing notes.   HISTORY  Chief Complaint Emesis    HPI Bianca Hayes is a 25 y.o. female with history of anemia and ovarian cysts who presents for evaluation of what she describes as heavy vaginal bleeding since yesterday, gradual onset, constant, moderate, no modifying factors. She reports that she has an Implanon and has not had a menstrual period in a year but she started having heavy vaginal bleeding yesterday. She also reports that over the past 2-3 weeks she has had vomiting which is been nonbloody and nonbilious. Initially she was seen in urgent care and diagnosed with a food borne illness however later on she developed cough, runny nose as well as sore throat and diarrhea. She reports that she is still having cough, intermittent vomiting and diarrhea. She has had some suprapubic abdominal pain/cramping, no dysuria.   Past Medical History  Diagnosis Date  . Ovarian cyst   . Anemia     There are no active problems to display for this patient.   History reviewed. No pertinent past surgical history.  Current Outpatient Rx  Name  Route  Sig  Dispense  Refill  . ranitidine (ZANTAC) 150 MG tablet   Oral   Take 1 tablet (150 mg total) by mouth 2 (two) times daily.   60 tablet   0   . sucralfate (CARAFATE) 1 G tablet   Oral   Take 1 tablet (1 g total) by mouth 4 (four) times daily.   60 tablet   0   . UNKNOWN TO PATIENT   Oral   Take 1 tablet by mouth daily.           Allergies Iodine; Bactrim; and Penicillins  No family history on file.  Social History Social History  Substance Use Topics  . Smoking status: Never Smoker   . Smokeless tobacco: None  . Alcohol Use: No    Review of Systems Constitutional: No fever/chills Eyes: No visual  changes. ENT: No sore throat. Cardiovascular: Denies chest pain. Respiratory: Denies shortness of breath. Gastrointestinal: + abdominal pain.  + nausea, + vomiting.  + diarrhea.  No constipation. Genitourinary: Negative for dysuria. Musculoskeletal: Negative for back pain. Skin: Negative for rash. Neurological: Negative for headaches, focal weakness or numbness.  10-point ROS otherwise negative.  ____________________________________________   PHYSICAL EXAM:  Filed Vitals:   04/07/16 1701 04/07/16 1829 04/07/16 1830  BP: 126/75 120/75 119/86  Pulse: 103 91 88  Temp: 98.6 F (37 C)    TempSrc: Oral    Resp: 20 20   Height: 5\' 8"  (1.727 m)    Weight: 330 lb (149.687 kg)    SpO2: 100% 100% 100%    VITAL SIGNS: ED Triage Vitals  Enc Vitals Group     BP 04/07/16 1701 126/75 mmHg     Pulse Rate 04/07/16 1701 103     Resp 04/07/16 1701 20     Temp 04/07/16 1701 98.6 F (37 C)     Temp Source 04/07/16 1701 Oral     SpO2 04/07/16 1701 100 %     Weight 04/07/16 1701 330 lb (149.687 kg)     Height 04/07/16 1701 5\' 8"  (1.727 m)     Head Cir --      Peak Flow --      Pain Score 04/07/16 1701 9  Pain Loc --      Pain Edu? --      Excl. in GC? --     Constitutional: Alert and oriented. Well appearing and in no acute distress. +frequent cough. Eyes: Conjunctivae are normal. PERRL. EOMI. Head: Atraumatic. Nose: No congestion/rhinnorhea. Mouth/Throat: Mucous membranes are moist.  Oropharynx non-erythematous, No exudates, no peritonsillar abscess or uvular deviation. Neck: No stridor.  Supple without meningismus. Cardiovascular: Normal rate, regular rhythm. Grossly normal heart sounds.  Good peripheral circulation. Respiratory: Normal respiratory effort.  No retractions. Lungs CTAB. Gastrointestinal: Soft and nontender. No distention. No CVA tenderness. Pelvic: tiny amount of blood tinged fluid in the vaginal vault, no active bleeding. Musculoskeletal: No lower extremity  tenderness nor edema.  No joint effusions. Neurologic:  Normal speech and language. No gross focal neurologic deficits are appreciated. No gait instability. Skin:  Skin is warm, dry and intact. No rash noted. Psychiatric: Mood and affect are normal. Speech and behavior are normal.  ____________________________________________   LABS (all labs ordered are listed, but only abnormal results are displayed)  Labs Reviewed  CBC WITH DIFFERENTIAL/PLATELET - Abnormal; Notable for the following:    RDW 15.6 (*)    All other components within normal limits  COMPREHENSIVE METABOLIC PANEL - Abnormal; Notable for the following:    Potassium 3.4 (*)    Calcium 8.5 (*)    All other components within normal limits  URINALYSIS COMPLETEWITH MICROSCOPIC ([facility] ONLY) - Abnormal; Notable for the following:    Color, Urine YELLOW (*)    APPearance HAZY (*)    Hgb urine dipstick 3+ (*)    Squamous Epithelial / LPF 0-5 (*)    All other components within normal limits  WET PREP, GENITAL  CHLAMYDIA/NGC RT PCR ([facility] ONLY)  LIPASE, BLOOD  HCG, QUANTITATIVE, PREGNANCY   ____________________________________________  EKG  none ____________________________________________  RADIOLOGY  CXR IMPRESSION: No active cardiopulmonary disease. ____________________________________________   PROCEDURES  Procedure(s) performed: None  Critical Care performed: No  ____________________________________________   INITIAL IMPRESSION / ASSESSMENT AND PLAN / ED COURSE  Pertinent labs & imaging results that were available during my care of the patient were reviewed by me and considered in my medical decision making (see chart for details).  Bianca Hayes is a 25 y.o. female with history of anemia and ovarian cysts who presents for evaluation of what she describes as heavy vaginal bleeding since yesterday. This is her primary complaint. She has also had cough, nausea vomiting and diarrhea over the  past week. On exam, she is well-appearing and in no acute distress. She was mildly tachycardic on arrival however this has resolved at the time assessment. The remainder of her vital signs are stable and she is afebrile. She has a benign abdominal examination. I doubt any acute life-threatening intra-abdominal process in this very well-appearing patient. We'll obtain screening labs, give IV fluids and antiemetics, obtain chest x-ray, reassess for disposition.  ----------------------------------------- 8:17 PM on 04/07/2016 ----------------------------------------- CBC and CMP unremarkable, normal lipase. Negative pregnancy test. Urinalysis with multiple red blood cells as expected given that she is complaining of vaginal bleeding. Her pelvic exam shows very tiny amount of blood-tinged vaginal discharge, no active bleeding. Negative wet prep. GC, the attending. She is tolerating by mouth intake without vomiting in the emergency department. We discussed return precautions, need for close PCP and GYN follow-up and she is comfortable with the discharge plan. DC home.  ____________________________________________   FINAL CLINICAL IMPRESSION(S) / ED DIAGNOSES  Final diagnoses:  Vaginal  bleeding  Vomiting and diarrhea      NEW MEDICATIONS STARTED DURING THIS VISIT:  New Prescriptions   No medications on file     Note:  This document was prepared using Dragon voice recognition software and may include unintentional dictation errors.    Gayla Doss, MD 04/07/16 2029

## 2016-04-07 NOTE — ED Notes (Signed)
Bianca Hayes, ed tech assisted md gayle with pelvic exam.

## 2016-04-09 LAB — URINE CULTURE

## 2017-08-07 ENCOUNTER — Emergency Department: Payer: No Typology Code available for payment source

## 2017-08-07 ENCOUNTER — Emergency Department
Admission: EM | Admit: 2017-08-07 | Discharge: 2017-08-07 | Disposition: A | Discharge status: Home / Self Care | Payer: No Typology Code available for payment source | Attending: Emergency Medicine | Admitting: Emergency Medicine

## 2017-08-07 ENCOUNTER — Telehealth: Payer: Self-pay

## 2017-08-07 DIAGNOSIS — Z79899 Other long term (current) drug therapy: Secondary | ICD-10-CM | POA: Insufficient documentation

## 2017-08-07 DIAGNOSIS — Y939 Activity, unspecified: Secondary | ICD-10-CM | POA: Insufficient documentation

## 2017-08-07 DIAGNOSIS — Y999 Unspecified external cause status: Secondary | ICD-10-CM | POA: Diagnosis not present

## 2017-08-07 DIAGNOSIS — S02113A Unspecified occipital condyle fracture, initial encounter for closed fracture: Secondary | ICD-10-CM | POA: Insufficient documentation

## 2017-08-07 DIAGNOSIS — Y92410 Unspecified street and highway as the place of occurrence of the external cause: Secondary | ICD-10-CM | POA: Insufficient documentation

## 2017-08-07 DIAGNOSIS — R51 Headache: Secondary | ICD-10-CM | POA: Insufficient documentation

## 2017-08-07 DIAGNOSIS — S0990XA Unspecified injury of head, initial encounter: Secondary | ICD-10-CM | POA: Diagnosis present

## 2017-08-07 DIAGNOSIS — M542 Cervicalgia: Secondary | ICD-10-CM | POA: Diagnosis not present

## 2017-08-07 DIAGNOSIS — R519 Headache, unspecified: Secondary | ICD-10-CM

## 2017-08-07 LAB — POCT PREGNANCY, URINE: Preg Test, Ur: NEGATIVE

## 2017-08-07 MED ORDER — OXYCODONE-ACETAMINOPHEN 5-325 MG PO TABS
2.0000 | ORAL_TABLET | Freq: Once | ORAL | Status: AC
  Administered 2017-08-07: 1 via ORAL
  Filled 2017-08-07: qty 2

## 2017-08-07 MED ORDER — OXYCODONE-ACETAMINOPHEN 5-325 MG PO TABS: 1.0000 | ORAL_TABLET | Freq: Four times a day (QID) | ORAL | 0 refills | Status: DC | PRN
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
## 2017-08-07 NOTE — ED Notes (Addendum)
Mother and patient given drink and snacks. Asked patient to keep walking to minimum until brace placed on patient. Pt was up and walking in room.
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
## 2017-08-07 NOTE — ED Notes (Signed)
Pt alert and oriented X4, active, cooperative, pt in NAD. RR even and unlabored, color WNL.  Pt family informed to return with patient if any life threatening symptoms occur. Discharge and followup instructions reviewed.  

## 2017-08-07 NOTE — Care Management Note (Signed)
Case Management Note  Patient Details  Name: Bianca Hayes MRN: [id number] Date of Birth: 10/20/1991  Subjective/Objective:     Called [employer] regarding [place] "J" collar for the patient. They open at 8:30am per the answering service. I have left a referral for the collar and it will go to the tech on call immediately. Left my number for callback.                Action/Plan:   Expected Discharge Date:                  Expected Discharge Plan:     In-House Referral:     Discharge planning Services     Post Acute Care Choice:    Choice offered to:     DME Arranged:    DME Agency:     HH Arranged:    HH Agency:     Status of Service:     If discussed at [employer] of Stay Meetings, dates discussed:    Additional Comments:  Berna Bue, RN 08/07/2017, 8:10 AM

## 2017-08-07 NOTE — ED Provider Notes (Signed)
note [place] J's are not available any longer feel anything we can put her in as an aspirin patient's been placed in an [place] collar will follow-up and do everything else as planned.   Arnaldo Natal, MD 08/07/17 [phone]
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
## 2017-08-07 NOTE — Discharge Instructions (Signed)
Please follow up with Neurosurgery. Please keep on your hard collar except for in showers

## 2017-08-07 NOTE — ED Notes (Signed)
Pt resting in bed, appears comfortable. c collar in place.

## 2017-08-07 NOTE — ED Notes (Signed)
Call to CT/Radiology with urine pregnancy results

## 2017-08-07 NOTE — ED Notes (Signed)
Pt awake and alert. Awaiting for biotech to come and place brace on patient.

## 2017-12-28 ENCOUNTER — Emergency Department
Admission: EM | Admit: 2017-12-28 | Discharge: 2017-12-28 | Disposition: A | Discharge status: Home / Self Care | Payer: Self-pay | Attending: Emergency Medicine | Admitting: Emergency Medicine

## 2017-12-28 ENCOUNTER — Encounter: Payer: Self-pay | Admitting: Emergency Medicine

## 2017-12-28 ENCOUNTER — Other Ambulatory Visit: Payer: Self-pay

## 2017-12-28 DIAGNOSIS — K029 Dental caries, unspecified: Secondary | ICD-10-CM | POA: Insufficient documentation

## 2017-12-28 MED ORDER — ONDANSETRON HCL 8 MG PO TABS: 8.0000 mg | ORAL_TABLET | Freq: Three times a day (TID) | ORAL | 0 refills | Status: DC | PRN

## 2017-12-28 MED ORDER — OXYCODONE-ACETAMINOPHEN 5-325 MG PO TABS: 1.0000 | ORAL_TABLET | ORAL | 0 refills | Status: DC | PRN

## 2017-12-28 NOTE — ED Notes (Signed)
Pt discharged to home.  Family member driving.  Discharge instructions reviewed.  Verbalized understanding.  No questions or concerns at this time.  Teach back verified.  Pt in NAD.  No items left in ED.   

## 2017-12-28 NOTE — ED Triage Notes (Signed)
Lower R dental pain x 2 weeks.

## 2017-12-28 NOTE — ED Provider Notes (Signed)
[facility] Emergency Department Provider Note   ____________________________________________   First MD Initiated Contact with Patient 12/28/17 1604     (approximate)  I have reviewed the triage vital signs and the nursing notes.   HISTORY  Chief Complaint Dental Pain    HPI Bianca Hayes is a 27 y.o. female patient complained of dental pain for 2 weeks.  Patient saw dentist 2 days ago was placed on antibiotics and he states they cannot pull her teeth at this time.  Patient states the antibiotic "clindamycin" is causing nausea.  Patient states the anti-inflammatory medication provided only mild relief for her dental pain.  Past Medical History:  Diagnosis Date  . Anemia   . Ovarian cyst     There are no active problems to display for this patient.   History reviewed. No pertinent surgical history.  Prior to Admission medications   Medication Sig Start Date End Date Taking? Authorizing Provider  ondansetron (ZOFRAN) 8 MG tablet Take 1 tablet (8 mg total) by mouth every 8 (eight) hours as needed for nausea or vomiting. 12/28/17   Joni Reining, PA-C  oxyCODONE-acetaminophen (PERCOCET) 5-325 MG tablet Take 1 tablet by mouth every 4 (four) hours as needed for severe pain. 12/28/17   Joni Reining, PA-C  oxyCODONE-acetaminophen (ROXICET) 5-325 MG tablet Take 1 tablet by mouth every 6 (six) hours as needed. 08/07/17   Rebecka Apley, MD  ranitidine (ZANTAC) 150 MG tablet Take 1 tablet (150 mg total) by mouth 2 (two) times daily. 06/01/15 05/31/16  Phineas Semen, MD  sucralfate (CARAFATE) 1 G tablet Take 1 tablet (1 g total) by mouth 4 (four) times daily. 06/01/15 05/31/16  Phineas Semen, MD  UNKNOWN TO PATIENT Take 1 tablet by mouth daily.    [provider]    Allergies Iodine; Bactrim [sulfamethoxazole-trimethoprim]; and Penicillins  No family history on file.  Social History Social History   Tobacco Use  . Smoking status: Never  Smoker  . Smokeless tobacco: Never Used  Substance Use Topics  . Alcohol use: No  . Drug use: No    Review of Systems Constitutional: No fever/chills Eyes: No visual changes. ENT: No sore throat.  Dental pain Cardiovascular: Denies chest pain. Respiratory: Denies shortness of breath. Gastrointestinal: No abdominal pain.  Nausea and vomiting secondary to antibiotic.  No diarrhea.  No constipation. Genitourinary: Negative for dysuria. Musculoskeletal: Negative for back pain. Skin: Negative for rash. Neurological: Negative for headaches, focal weakness or numbness. Allergic/Immunilogical: See medication list. ____________________________________________   PHYSICAL EXAM:  VITAL SIGNS: ED Triage Vitals  Enc Vitals Group     BP 12/28/17 1522 (!) 165/92     Pulse Rate 12/28/17 1522 85     Resp 12/28/17 1522 18     Temp 12/28/17 1522 98.4 F (36.9 C)     Temp Source 12/28/17 1522 Oral     SpO2 12/28/17 1522 97 %     Weight 12/28/17 1523 300 lb (136.1 kg)     Height 12/28/17 1523 5\' 9"  (1.753 m)     Head Circumference --      Peak Flow --      Pain Score 12/28/17 1522 10     Pain Loc --      Pain Edu? --      Excl. in GC? --    Constitutional: Alert and oriented. Well appearing and in no acute distress. Eyes: Conjunctivae are normal. PERRL. EOMI. Head: Atraumatic. Nose: No congestion/rhinnorhea. Mouth/Throat:  Mucous membranes are moist.  Oropharynx non-erythematous.  Dental caries #27-29. Neck: No stridor.   Hematological/Lymphatic/Immunilogical: No cervical lymphadenopathy. Cardiovascular: Normal rate, regular rhythm. Grossly normal heart sounds.  Good peripheral circulation.  Elevated blood pressure Respiratory: Normal respiratory effort.  No retractions. Lungs CTAB. Gastrointestinal: Soft and nontender. No distention. No abdominal bruits. No CVA tenderness. Skin:  Skin is warm, dry and intact. No rash noted. Psychiatric: Mood and affect are normal. Speech and behavior  are normal.  ____________________________________________   LABS (all labs ordered are listed, but only abnormal results are displayed)  Labs Reviewed - No data to display ____________________________________________  EKG   ____________________________________________  RADIOLOGY  ED MD interpretation:    Official radiology report(s): No results found.  ____________________________________________   PROCEDURES  Procedure(s) performed: None  Procedures  Critical Care performed: No  ____________________________________________   INITIAL IMPRESSION / ASSESSMENT AND PLAN / ED COURSE  As part of my medical decision making, I reviewed the following data within the electronic MEDICAL RECORD NUMBER Notes from prior ED visits   Dental pain secondary to caries.  Advised patient continue antibiotics as directed.  Patient given a prescription for Zofran and Percocet.  Patient advised to follow-up with scheduled dental appointment.  Patient can return to work note.     ____________________________________________   FINAL CLINICAL IMPRESSION(S) / ED DIAGNOSES  Final diagnoses:  None     ED Discharge Orders        Ordered    oxyCODONE-acetaminophen (PERCOCET) 5-325 MG tablet  Every 4 hours PRN     12/28/17 1613    ondansetron (ZOFRAN) 8 MG tablet  Every 8 hours PRN     12/28/17 1613       Note:  This document was prepared using Dragon voice recognition software and may include unintentional dictation errors.    Joni ReiningSmith, Carmalita Wakefield K, PA-C 12/28/17 1616    Phineas SemenGoodman, Graydon, MD 12/28/17 2004

## 2017-12-28 NOTE — ED Notes (Signed)
Pt ambulatory to room.  Complaining of dental pain.  Pt is A&Ox4 in NAD.

## 2018-01-05 ENCOUNTER — Emergency Department
Admission: EM | Admit: 2018-01-05 | Discharge: 2018-01-05 | Disposition: A | Discharge status: Home / Self Care | Payer: Self-pay | Attending: Emergency Medicine | Admitting: Emergency Medicine

## 2018-01-05 ENCOUNTER — Other Ambulatory Visit: Payer: Self-pay

## 2018-01-05 DIAGNOSIS — W540XXA Bitten by dog, initial encounter: Secondary | ICD-10-CM | POA: Insufficient documentation

## 2018-01-05 DIAGNOSIS — Y929 Unspecified place or not applicable: Secondary | ICD-10-CM | POA: Insufficient documentation

## 2018-01-05 DIAGNOSIS — Z23 Encounter for immunization: Secondary | ICD-10-CM | POA: Insufficient documentation

## 2018-01-05 DIAGNOSIS — Y9389 Activity, other specified: Secondary | ICD-10-CM | POA: Insufficient documentation

## 2018-01-05 DIAGNOSIS — S61452A Open bite of left hand, initial encounter: Secondary | ICD-10-CM | POA: Insufficient documentation

## 2018-01-05 DIAGNOSIS — Y999 Unspecified external cause status: Secondary | ICD-10-CM | POA: Insufficient documentation

## 2018-01-05 MED ORDER — DOXYCYCLINE HYCLATE 100 MG PO CAPS: 100.0000 mg | ORAL_CAPSULE | Freq: Two times a day (BID) | ORAL | 0 refills | Status: DC

## 2018-01-05 MED ORDER — TETANUS-DIPHTH-ACELL PERTUSSIS 5-2.5-18.5 LF-MCG/0.5 IM SUSP
0.5000 mL | Freq: Once | INTRAMUSCULAR | Status: AC
Start: 2018-01-05 — End: 2018-01-05
  Administered 2018-01-05: 0.5 mL via INTRAMUSCULAR
  Filled 2018-01-05: qty 0.5
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
## 2018-01-05 NOTE — ED Triage Notes (Signed)
Pt states dog bit yesterday to bilat hands. States not pt's dog but knows who the dog belongs to. States person told pt that dog is UTD on shots but pt states "I don't believe her." no bleeding at this time.
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
## 2018-03-29 ENCOUNTER — Emergency Department: Payer: Medicaid Other

## 2018-03-29 ENCOUNTER — Other Ambulatory Visit: Payer: Self-pay

## 2018-03-29 ENCOUNTER — Emergency Department
Admission: EM | Admit: 2018-03-29 | Discharge: 2018-03-29 | Disposition: A | Discharge status: Home / Self Care | Payer: Medicaid Other | Attending: Emergency Medicine | Admitting: Emergency Medicine

## 2018-03-29 ENCOUNTER — Encounter: Payer: Self-pay | Admitting: Emergency Medicine

## 2018-03-29 DIAGNOSIS — K529 Noninfective gastroenteritis and colitis, unspecified: Secondary | ICD-10-CM

## 2018-03-29 DIAGNOSIS — R109 Unspecified abdominal pain: Secondary | ICD-10-CM | POA: Diagnosis present

## 2018-03-29 DIAGNOSIS — K5289 Other specified noninfective gastroenteritis and colitis: Secondary | ICD-10-CM | POA: Diagnosis not present

## 2018-03-29 LAB — URINALYSIS, COMPLETE (UACMP) WITH MICROSCOPIC
Bilirubin Urine: NEGATIVE
GLUCOSE, UA: NEGATIVE mg/dL
Ketones, ur: NEGATIVE mg/dL
Leukocytes, UA: NEGATIVE
Nitrite: NEGATIVE
PH: 5 (ref 5.0–8.0)
Protein, ur: NEGATIVE mg/dL
SPECIFIC GRAVITY, URINE: 1.02 (ref 1.005–1.030)

## 2018-03-29 LAB — CBC
HCT: 37.6 % (ref 35.0–47.0)
HEMOGLOBIN: 12.6 g/dL (ref 12.0–16.0)
MCH: 26.5 pg (ref 26.0–34.0)
MCHC: 33.6 g/dL (ref 32.0–36.0)
MCV: 78.8 fL — AB (ref 80.0–100.0)
Platelets: 226 10*3/uL (ref 150–440)
RBC: 4.77 MIL/uL (ref 3.80–5.20)
RDW: 15.8 % — ABNORMAL HIGH (ref 11.5–14.5)
WBC: 6.5 10*3/uL (ref 3.6–11.0)

## 2018-03-29 LAB — COMPREHENSIVE METABOLIC PANEL
ALBUMIN: 3.7 g/dL (ref 3.5–5.0)
ALK PHOS: 85 U/L (ref 38–126)
ALT: 19 U/L (ref 14–54)
ANION GAP: 7 (ref 5–15)
AST: 20 U/L (ref 15–41)
BILIRUBIN TOTAL: 0.6 mg/dL (ref 0.3–1.2)
BUN: 13 mg/dL (ref 6–20)
CALCIUM: 8 mg/dL — AB (ref 8.9–10.3)
CO2: 21 mmol/L — ABNORMAL LOW (ref 22–32)
Chloride: 110 mmol/L (ref 101–111)
Creatinine, Ser: 0.54 mg/dL (ref 0.44–1.00)
GFR calc Af Amer: >60 mL/min (ref >60)
GFR calc non Af Amer: >60 mL/min (ref >60)
GLUCOSE: 122 mg/dL — AB (ref 65–99)
Potassium: 3.5 mmol/L (ref 3.5–5.1)
Sodium: 138 mmol/L (ref 135–145)
TOTAL PROTEIN: 6.7 g/dL (ref 6.5–8.1)

## 2018-03-29 LAB — LIPASE, BLOOD: Lipase: 22 U/L (ref 11–51)

## 2018-03-29 LAB — POCT PREGNANCY, URINE: Preg Test, Ur: NEGATIVE

## 2018-03-29 MED ORDER — KETOROLAC TROMETHAMINE 30 MG/ML IJ SOLN
30.0000 mg | Freq: Once | INTRAMUSCULAR | Status: AC
  Administered 2018-03-29: 30 mg via INTRAVENOUS
  Filled 2018-03-29: qty 1

## 2018-03-29 MED ORDER — MORPHINE SULFATE (PF) 4 MG/ML IV SOLN
4.0000 mg | Freq: Once | INTRAVENOUS | Status: AC
Start: 2018-03-29 — End: 2018-03-29
  Administered 2018-03-29: 4 mg via INTRAVENOUS
  Filled 2018-03-29: qty 1

## 2018-03-29 MED ORDER — DICYCLOMINE HCL 20 MG PO TABS: 20.0000 mg | ORAL_TABLET | Freq: Three times a day (TID) | ORAL | 0 refills | Status: DC | PRN

## 2018-03-29 MED ORDER — ONDANSETRON HCL 4 MG/2ML IJ SOLN
4.0000 mg | Freq: Once | INTRAMUSCULAR | Status: AC
  Administered 2018-03-29: 4 mg via INTRAVENOUS
  Filled 2018-03-29: qty 2

## 2018-03-29 MED ORDER — PROMETHAZINE HCL 25 MG/ML IJ SOLN
25.0000 mg | Freq: Once | INTRAMUSCULAR | Status: AC
  Administered 2018-03-29: 25 mg via INTRAVENOUS
  Filled 2018-03-29: qty 1

## 2018-03-29 MED ORDER — SODIUM CHLORIDE 0.9 % IV SOLN
Freq: Once | INTRAVENOUS | Status: AC
  Administered 2018-03-29: 19:00:00 via INTRAVENOUS

## 2018-03-29 MED ORDER — ONDANSETRON 4 MG PO TBDP: 4.0000 mg | ORAL_TABLET | Freq: Three times a day (TID) | ORAL | 0 refills | Status: DC | PRN

## 2018-03-29 MED ORDER — PANTOPRAZOLE SODIUM 40 MG PO TBEC: 40.0000 mg | DELAYED_RELEASE_TABLET | Freq: Every day | ORAL | 1 refills | Status: DC

## 2018-03-29 NOTE — ED Provider Notes (Signed)
[facility] Emergency Department Provider Note       Time seen: ----------------------------------------- 7:02 PM on 03/29/2018 -----------------------------------------   I have reviewed the triage vital signs and the nursing notes.  HISTORY   Chief Complaint Abdominal Pain    HPI Bianca Hayes is a 27 y.o. female with a history of anemia and ovarian cysts who presents to the ED for nausea, vomiting and abdominal pain since yesterday.  Patient describes diffuse mid abdominal pain.  Nothing makes it better or worse.  Patient reports a intermittent history of this in the past, she feels like she has vomited 20 times in the last 24 hours.  Past Medical History:  Diagnosis Date  . Anemia   . Ovarian cyst     There are no active problems to display for this patient.   Past Surgical History:  Procedure Laterality Date  . DENTAL SURGERY      Allergies Iodine; Bactrim [sulfamethoxazole-trimethoprim]; and Penicillins  Social History Social History   Tobacco Use  . Smoking status: Never Smoker  . Smokeless tobacco: Never Used  Substance Use Topics  . Alcohol use: No  . Drug use: No   Review of Systems Constitutional: Negative for fever. Cardiovascular: Negative for chest pain. Respiratory: Negative for shortness of breath. Gastrointestinal: Positive for abdominal pain, vomiting and diarrhea Genitourinary: Negative for dysuria. Musculoskeletal: Negative for back pain. Skin: Negative for rash. Neurological: Negative for headaches, focal weakness or numbness.  All systems negative/normal/unremarkable except as stated in the HPI  ____________________________________________   PHYSICAL EXAM:  VITAL SIGNS: ED Triage Vitals  Enc Vitals Group     BP 03/29/18 1806 (!) 162/83     Pulse Rate 03/29/18 1806 92     Resp 03/29/18 1806 18     Temp 03/29/18 1806 98.6 F (37 C)     Temp Source 03/29/18 1806 Oral     SpO2 03/29/18 1806 100 %      Weight 03/29/18 1806 (!) 330 lb (149.7 kg)     Height 03/29/18 1806 5\' 9"  (1.753 m)     Head Circumference --      Peak Flow --      Pain Score 03/29/18 1808 10     Pain Loc --      Pain Edu? --      Excl. in GC? --    Constitutional: Alert and oriented. Well appearing and in no distress.  Obese Eyes: Conjunctivae are normal. Normal extraocular movements. ENT   Head: Normocephalic and atraumatic.   Nose: No congestion/rhinnorhea.   Mouth/Throat: Mucous membranes are moist.   Neck: No stridor. Cardiovascular: Normal rate, regular rhythm. No murmurs, rubs, or gallops. Respiratory: Normal respiratory effort without tachypnea nor retractions. Breath sounds are clear and equal bilaterally. No wheezes/rales/rhonchi. Gastrointestinal: Soft and nontender. Normal bowel sounds Musculoskeletal: Nontender with normal range of motion in extremities. No lower extremity tenderness nor edema. Neurologic:  Normal speech and language. No gross focal neurologic deficits are appreciated.  Skin:  Skin is warm, dry and intact. No rash noted. Psychiatric: Mood and affect are normal. Speech and behavior are normal.   ____________________________________________  ED COURSE:  As part of my medical decision making, I reviewed the following data within the electronic MEDICAL RECORD NUMBER History obtained from family if available, nursing notes, old chart and ekg, as well as notes from prior ED visits. Patient presented for abdominal pain, vomiting and diarrhea, we will assess with labs and imaging as indicated at this  time.   Procedures ____________________________________________   LABS (pertinent positives/negatives)  Labs Reviewed  COMPREHENSIVE METABOLIC PANEL - Abnormal; Notable for the following components:      Result Value   CO2 21 (*)    Glucose, Bld 122 (*)    Calcium 8.0 (*)    All other components within normal limits  URINALYSIS, COMPLETE (UACMP) WITH MICROSCOPIC - Abnormal;  Notable for the following components:   Color, Urine YELLOW (*)    APPearance HAZY (*)    Hgb urine dipstick SMALL (*)    Bacteria, UA RARE (*)    All other components within normal limits  CBC - Abnormal; Notable for the following components:   MCV 78.8 (*)    RDW 15.8 (*)    All other components within normal limits  LIPASE, BLOOD  POC URINE PREG, ED  POCT PREGNANCY, URINE    RADIOLOGY Images were viewed by me  Abdomen 2 view is unremarkable  ____________________________________________  DIFFERENTIAL DIAGNOSIS   Gastroenteritis, dehydration, electrolyte abnormality  FINAL ASSESSMENT AND PLAN  Gastroenteritis   Plan: The patient had presented for vomiting and diarrhea. Patient's labs are reassuring. Patient's imaging was also negative.  She is in no distress at this time, is feeling better.  She is cleared for outpatient follow-up with GI.  Ulice Dash, MD   Note: This note was generated in part or whole with voice recognition software. Voice recognition is usually quite accurate but there are transcription errors that can and very often do occur. I apologize for any typographical errors that were not detected and corrected.     Emily Filbert, MD 03/29/18 Ebony Cargo

## 2018-03-29 NOTE — ED Triage Notes (Signed)
Nausea, vomiting and abd pain since yesterday.

## 2018-03-29 NOTE — ED Notes (Signed)
Pt returned from xray

## 2018-03-31 ENCOUNTER — Telehealth: Payer: Self-pay | Admitting: Gastroenterology

## 2018-03-31 NOTE — Telephone Encounter (Signed)
Left vm for pt to call office and schedule FU APT

## 2018-04-30 ENCOUNTER — Ambulatory Visit: Payer: Medicaid Other | Admitting: Gastroenterology

## 2018-04-30 ENCOUNTER — Encounter: Payer: Self-pay | Admitting: *Deleted

## 2018-07-08 ENCOUNTER — Encounter: Payer: Self-pay | Admitting: Emergency Medicine

## 2018-07-08 ENCOUNTER — Emergency Department
Admission: EM | Admit: 2018-07-08 | Discharge: 2018-07-08 | Disposition: A | Discharge status: Home / Self Care | Payer: Medicaid Other | Attending: Emergency Medicine | Admitting: Emergency Medicine

## 2018-07-08 DIAGNOSIS — S30860A Insect bite (nonvenomous) of lower back and pelvis, initial encounter: Secondary | ICD-10-CM | POA: Insufficient documentation

## 2018-07-08 DIAGNOSIS — Y929 Unspecified place or not applicable: Secondary | ICD-10-CM | POA: Insufficient documentation

## 2018-07-08 DIAGNOSIS — W57XXXA Bitten or stung by nonvenomous insect and other nonvenomous arthropods, initial encounter: Secondary | ICD-10-CM | POA: Insufficient documentation

## 2018-07-08 DIAGNOSIS — R1110 Vomiting, unspecified: Secondary | ICD-10-CM | POA: Diagnosis not present

## 2018-07-08 DIAGNOSIS — Z79899 Other long term (current) drug therapy: Secondary | ICD-10-CM | POA: Diagnosis not present

## 2018-07-08 DIAGNOSIS — Y999 Unspecified external cause status: Secondary | ICD-10-CM | POA: Insufficient documentation

## 2018-07-08 DIAGNOSIS — Y939 Activity, unspecified: Secondary | ICD-10-CM | POA: Insufficient documentation

## 2018-07-08 MED ORDER — ONDANSETRON HCL 4 MG PO TABS: 4.0000 mg | ORAL_TABLET | Freq: Three times a day (TID) | ORAL | 1 refills | Status: AC | PRN

## 2018-07-08 NOTE — ED Notes (Addendum)
States she got stung by a horsefly or yellow jacket on butt and then states she started feeling bad at work tonight with abd cramping and nausea

## 2018-07-08 NOTE — ED Triage Notes (Signed)
Pt reports she was stung by yellow jacket on Monday and has had nausea and "doesn't feel good." Insect bite on the left buttocks.

## 2018-07-08 NOTE — ED Provider Notes (Signed)
Bianca Hayes  ____________________________________________  Time seen: Approximately 9:28 PM  I have reviewed the triage vital signs and the nursing notes.   HISTORY  Chief Complaint Insect Bite    HPI CHARMANE MENNEN is a 27 y.o. female presents to the emergency department after a yellowjacket stung her on the left buttocks last night.  Patient reports that she has not felt well since insect stung her.  She has had 2 episodes of emesis.  No shortness of breath, cough, chest tightness, abdominal pain or syncope.  No prior episodes of anaphylaxis.  Patient does not have a rash in any other locations of her body other than buttocks.  Patient reports that her symptoms have kept her from staying at work.  No alleviating measures of been attempted.   Past Medical History:  Diagnosis Date  . Anemia   . Ovarian cyst     There are no active problems to display for this patient.   Past Surgical History:  Procedure Laterality Date  . DENTAL SURGERY      Prior to Admission medications   Medication Sig Start Date End Date Taking? Authorizing Provider  dicyclomine (BENTYL) 20 MG tablet Take 1 tablet (20 mg total) by mouth 3 (three) times daily as needed for spasms. 03/29/18   Emily Filbert, MD  doxycycline (VIBRAMYCIN) 100 MG capsule Take 1 capsule (100 mg total) by mouth 2 (two) times daily. 01/05/18   Fisher, Roselyn Bering, PA-C  ondansetron (ZOFRAN ODT) 4 MG disintegrating tablet Take 1 tablet (4 mg total) by mouth every 8 (eight) hours as needed for nausea or vomiting. 03/29/18   Emily Filbert, MD  ondansetron (ZOFRAN) 4 MG tablet Take 1 tablet (4 mg total) by mouth every 8 (eight) hours as needed for up to 3 days for nausea or vomiting. 07/08/18 07/11/18  Orvil Feil, PA-C  oxyCODONE-acetaminophen (PERCOCET) 5-325 MG tablet Take 1 tablet by mouth every 4 (four) hours as needed for severe pain. 12/28/17   Joni Reining,  PA-C  oxyCODONE-acetaminophen (ROXICET) 5-325 MG tablet Take 1 tablet by mouth every 6 (six) hours as needed. 08/07/17   Rebecka Apley, MD  pantoprazole (PROTONIX) 40 MG tablet Take 1 tablet (40 mg total) by mouth daily. 03/29/18 03/29/19  Emily Filbert, MD  ranitidine (ZANTAC) 150 MG tablet Take 1 tablet (150 mg total) by mouth 2 (two) times daily. 06/01/15 05/31/16  Phineas Semen, MD  sucralfate (CARAFATE) 1 G tablet Take 1 tablet (1 g total) by mouth 4 (four) times daily. 06/01/15 05/31/16  Phineas Semen, MD  UNKNOWN TO PATIENT Take 1 tablet by mouth daily.    [provider]    Allergies Iodine; Bactrim [sulfamethoxazole-trimethoprim]; and Penicillins  History reviewed. No pertinent family history.  Social History Social History   Tobacco Use  . Smoking status: Never Smoker  . Smokeless tobacco: Never Used  Substance Use Topics  . Alcohol use: No  . Drug use: No     Review of Systems  Constitutional: No fever/chills Eyes: No visual changes. No discharge ENT: No upper respiratory complaints. Cardiovascular: no chest pain. Respiratory: no cough. No SOB. Gastrointestinal: No abdominal pain.  No nausea, no vomiting.  No diarrhea.  No constipation. Musculoskeletal: Negative for musculoskeletal pain. Skin: Patient has insect bite.  Neurological: Negative for headaches, focal weakness or numbness.   ____________________________________________   PHYSICAL EXAM:  VITAL SIGNS: ED Triage Vitals  Enc Vitals Group  BP 07/08/18 2008 (!) 134/98     Pulse Rate 07/08/18 2008 100     Resp 07/08/18 2008 18     Temp 07/08/18 2008 99.1 F (37.3 C)     Temp Source 07/08/18 2008 Oral     SpO2 07/08/18 2008 100 %     Weight --      Height --      Head Circumference --      Peak Flow --      Pain Score 07/08/18 2117 7     Pain Loc --      Pain Edu? --      Excl. in GC? --      Constitutional: Alert and oriented. Well appearing and in no acute  distress. Eyes: Conjunctivae are normal. PERRL. EOMI. Head: Atraumatic. ENT: Cardiovascular: Normal rate, regular rhythm. Normal S1 and S2.  Good peripheral circulation. Respiratory: Normal respiratory effort without tachypnea or retractions. Lungs CTAB. Good air entry to the bases with no decreased or absent breath sounds. Musculoskeletal: Full range of motion to all extremities. No gross deformities appreciated. Neurologic:  Normal speech and language. No gross focal neurologic deficits are appreciated.  Skin: Patient has small region of local erythema at left buttocks, pinpoint in size.  No surrounding cellulitis or palpable induration or fluctuance. Psychiatric: Mood and affect are normal. Speech and behavior are normal. Patient exhibits appropriate insight and judgement.   ____________________________________________   LABS (all labs ordered are listed, but only abnormal results are displayed)  Labs Reviewed - No data to display ____________________________________________  EKG   ____________________________________________  RADIOLOGY  No results found.  ____________________________________________    PROCEDURES  Procedure(s) performed:    Procedures    Medications - No data to display   ____________________________________________   INITIAL IMPRESSION / ASSESSMENT AND PLAN / ED COURSE  Pertinent labs & imaging results that were available during my care of the patient were reviewed by me and considered in my medical decision making (see chart for details).  Review of the [place] CSRS was performed in accordance of the [facility] prior to dispensing any controlled drugs.    Assessment and plan Insect bite Patient presents to the emergency department after a yellowjacket stung her.  Patient has experienced vomiting since incident occurred.  Overall physical exam was reassuring without cellulitis.  No concern for abscess at this time.  Patient was discharged with a  short course of Zofran.  She was advised to follow-up with primary care as needed.  All patient questions were answered.     ____________________________________________  FINAL CLINICAL IMPRESSION(S) / ED DIAGNOSES  Final diagnoses:  Insect bite, unspecified site, initial encounter      NEW MEDICATIONS STARTED DURING THIS VISIT:  ED Discharge Orders         Ordered    ondansetron (ZOFRAN) 4 MG tablet  Every 8 hours PRN     07/08/18 2111              This chart was dictated using voice recognition software/Dragon. Despite best efforts to proofread, errors can occur which can change the meaning. Any change was purely unintentional.    Orvil Feil, PA-C 07/08/18 2132    Nita Sickle, MD 07/08/18 2320

## 2018-08-09 ENCOUNTER — Other Ambulatory Visit: Payer: Self-pay

## 2018-08-09 ENCOUNTER — Emergency Department
Admission: EM | Admit: 2018-08-09 | Discharge: 2018-08-10 | Disposition: A | Discharge status: Home / Self Care | Payer: Medicaid Other | Attending: Emergency Medicine | Admitting: Emergency Medicine

## 2018-08-09 DIAGNOSIS — Z79899 Other long term (current) drug therapy: Secondary | ICD-10-CM | POA: Diagnosis not present

## 2018-08-09 DIAGNOSIS — K529 Noninfective gastroenteritis and colitis, unspecified: Secondary | ICD-10-CM | POA: Diagnosis not present

## 2018-08-09 DIAGNOSIS — R112 Nausea with vomiting, unspecified: Secondary | ICD-10-CM | POA: Diagnosis present

## 2018-08-09 LAB — COMPREHENSIVE METABOLIC PANEL
ALT: 22 U/L (ref 0–44)
AST: 22 U/L (ref 15–41)
Albumin: 4.1 g/dL (ref 3.5–5.0)
Alkaline Phosphatase: 95 U/L (ref 38–126)
Anion gap: 10 (ref 5–15)
BILIRUBIN TOTAL: 0.7 mg/dL (ref 0.3–1.2)
BUN: 15 mg/dL (ref 6–20)
CHLORIDE: 105 mmol/L (ref 98–111)
CO2: 27 mmol/L (ref 22–32)
Calcium: 8.7 mg/dL — ABNORMAL LOW (ref 8.9–10.3)
Creatinine, Ser: 0.73 mg/dL (ref 0.44–1.00)
Glucose, Bld: 117 mg/dL — ABNORMAL HIGH (ref 70–99)
Potassium: 3.4 mmol/L — ABNORMAL LOW (ref 3.5–5.1)
Sodium: 142 mmol/L (ref 135–145)
TOTAL PROTEIN: 7.1 g/dL (ref 6.5–8.1)

## 2018-08-09 LAB — CBC
HEMATOCRIT: 37.8 % (ref 36.0–46.0)
HEMOGLOBIN: 12.2 g/dL (ref 12.0–15.0)
MCH: 26.7 pg (ref 26.0–34.0)
MCHC: 32.3 g/dL (ref 30.0–36.0)
MCV: 82.7 fL (ref 80.0–100.0)
PLATELETS: 239 10*3/uL (ref 150–400)
RBC: 4.57 MIL/uL (ref 3.87–5.11)
RDW: 14.7 % (ref 11.5–15.5)
WBC: 7.3 10*3/uL (ref 4.0–10.5)
nRBC: 0 % (ref 0.0–0.2)

## 2018-08-09 LAB — POCT PREGNANCY, URINE: PREG TEST UR: NEGATIVE

## 2018-08-09 LAB — LIPASE, BLOOD: LIPASE: 21 U/L (ref 11–51)

## 2018-08-09 MED ORDER — ONDANSETRON 4 MG PO TBDP: 4.0000 mg | ORAL_TABLET | Freq: Three times a day (TID) | ORAL | 0 refills | Status: DC | PRN

## 2018-08-09 MED ORDER — ONDANSETRON HCL 4 MG/2ML IJ SOLN
4.0000 mg | Freq: Once | INTRAMUSCULAR | Status: AC
  Administered 2018-08-09: 4 mg via INTRAVENOUS
  Filled 2018-08-09: qty 2

## 2018-08-09 MED ORDER — SODIUM CHLORIDE 0.9 % IV SOLN
1000.0000 mL | Freq: Once | INTRAVENOUS | Status: AC
  Administered 2018-08-09: 1000 mL via INTRAVENOUS

## 2018-08-09 MED ORDER — ONDANSETRON HCL 4 MG/2ML IJ SOLN
4.0000 mg | Freq: Once | INTRAMUSCULAR | Status: AC
  Administered 2018-08-09: 4 mg via INTRAVENOUS

## 2018-08-09 NOTE — ED Triage Notes (Signed)
Patient reports having abdominal pain, hot/cold flashes, vomiting and diarrhea for several days.

## 2018-08-09 NOTE — ED Provider Notes (Signed)
[facility] Emergency Department Provider Note   ____________________________________________    I have reviewed the triage vital signs and the nursing notes.   HISTORY  Chief Complaint Abdominal Pain     HPI Bianca Hayes is a 27 y.o. female who presents with complaints of nausea vomiting and diarrhea x3 days.  Patient reports intermittent diffuse abdominal cramping as well.  Denies fevers or chills.  No sick contacts reported.  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  Has not taken anything for this.  No recent travel.  Vomitus is nonbilious nonbloody.  Positive myalgias.   Past Medical History:  Diagnosis Date  . Anemia   . Ovarian cyst     There are no active problems to display for this patient.   Past Surgical History:  Procedure Laterality Date  . DENTAL SURGERY      Prior to Admission medications   Medication Sig Start Date End Date Taking? Authorizing Provider  dicyclomine (BENTYL) 20 MG tablet Take 1 tablet (20 mg total) by mouth 3 (three) times daily as needed for spasms. 03/29/18   Emily Filbert, MD  doxycycline (VIBRAMYCIN) 100 MG capsule Take 1 capsule (100 mg total) by mouth 2 (two) times daily. 01/05/18   Fisher, Roselyn Bering, PA-C  ondansetron (ZOFRAN ODT) 4 MG disintegrating tablet Take 1 tablet (4 mg total) by mouth every 8 (eight) hours as needed for nausea or vomiting. 08/09/18   Jene Every, MD  oxyCODONE-acetaminophen (PERCOCET) 5-325 MG tablet Take 1 tablet by mouth every 4 (four) hours as needed for severe pain. 12/28/17   Joni Reining, PA-C  oxyCODONE-acetaminophen (ROXICET) 5-325 MG tablet Take 1 tablet by mouth every 6 (six) hours as needed. 08/07/17   Rebecka Apley, MD  pantoprazole (PROTONIX) 40 MG tablet Take 1 tablet (40 mg total) by mouth daily. 03/29/18 03/29/19  Emily Filbert, MD  ranitidine (ZANTAC) 150 MG tablet Take 1 tablet (150 mg total) by mouth 2 (two) times daily. 06/01/15 05/31/16   Phineas Semen, MD  sucralfate (CARAFATE) 1 G tablet Take 1 tablet (1 g total) by mouth 4 (four) times daily. 06/01/15 05/31/16  Phineas Semen, MD  UNKNOWN TO PATIENT Take 1 tablet by mouth daily.    [provider]     Allergies Iodine; Bactrim [sulfamethoxazole-trimethoprim]; and Penicillins  No family history on file.  Social History Social History   Tobacco Use  . Smoking status: Never Smoker  . Smokeless tobacco: Never Used  Substance Use Topics  . Alcohol use: No  . Drug use: No    Review of Systems  Constitutional: No fever/chills Eyes: No visual changes.  ENT: No sore throat. Cardiovascular: Denies chest pain. Respiratory: Denies shortness of breath. Gastrointestinal: As above Genitourinary: Negative for dysuria. Musculoskeletal: Positive myalgias Skin: Negative for rash. Neurological: Negative for headaches   ____________________________________________   PHYSICAL EXAM:  VITAL SIGNS: ED Triage Vitals  Enc Vitals Group     BP 08/09/18 2116 (!) 152/102     Pulse Rate 08/09/18 2116 (!) 128     Resp 08/09/18 2116 (!) 22     Temp 08/09/18 2125 98.7 F (37.1 C)     Temp Source 08/09/18 2125 Oral     SpO2 08/09/18 2116 97 %     Weight 08/09/18 2111 136.1 kg (300 lb)     Height 08/09/18 2111 1.753 m (5\' 9" )     Head Circumference --      Peak Flow --  Pain Score --      Pain Loc --      Pain Edu? --      Excl. in GC? --     Constitutional: Alert and oriented.  Actively vomiting Eyes: Conjunctivae are normal.   Nose: No congestion/rhinnorhea. Mouth/Throat: Mucous membranes are moist.    Cardiovascular: Tachycardia, regular rhythm. Grossly normal heart sounds.  Good peripheral circulation. Respiratory: Normal respiratory effort.  No retractions. Lungs CTAB. Gastrointestinal: Soft and nontender. No distention.   Musculoskeletal:   Warm and well perfused Neurologic:  Normal speech and language. No gross focal neurologic deficits are  appreciated.  Skin:  Skin is warm, dry and intact. No rash noted. Psychiatric: Mood and affect are normal. Speech and behavior are normal.  ____________________________________________   LABS (all labs ordered are listed, but only abnormal results are displayed)  Labs Reviewed  COMPREHENSIVE METABOLIC PANEL - Abnormal; Notable for the following components:      Result Value   Potassium 3.4 (*)    Glucose, Bld 117 (*)    Calcium 8.7 (*)    All other components within normal limits  CBC  LIPASE, BLOOD  POCT PREGNANCY, URINE   ____________________________________________  EKG   ____________________________________________  RADIOLOGY   ____________________________________________   PROCEDURES  Procedure(s) performed: No  Procedures   Critical Care performed: No ____________________________________________   INITIAL IMPRESSION / ASSESSMENT AND PLAN / ED COURSE  Pertinent labs & imaging results that were available during my care of the patient were reviewed by me and considered in my medical decision making (see chart for details).  Patient presents with nausea vomiting and diarrhea.  She is tachycardic on exam.  Lab work is overall quite reassuring.  Strongly suspicious for viral gastroenteritis, will treat with IV fluids, IV Zofran and reevaluate   ----------------------------------------- 10:44 PM on 08/09/2018 -----------------------------------------  Patient continued to have nausea and vomiting, will give additional dose of Zofran    ____________________________________________   FINAL CLINICAL IMPRESSION(S) / ED DIAGNOSES  Final diagnoses:  Gastroenteritis        Note:  This document was prepared using Dragon voice recognition software and may include unintentional dictation errors.    Jene Every, MD 08/09/18 2245

## 2018-08-09 NOTE — Discharge Instructions (Addendum)
It was a pleasure to take care of you today, and thank you for coming to our emergency department.  If you have any questions or concerns before leaving please ask the nurse to grab me and I'm more than happy to go through your aftercare instructions again.  If you have any concerns once you are home that you are not improving or are in fact getting worse before you can make it to your follow-up appointment, please do not hesitate to call 911 and come back for further evaluation.    Results for orders placed or performed during the hospital encounter of 08/09/18  CBC  Result Value Ref Range   WBC 7.3 4.0 - 10.5 K/uL   RBC 4.57 3.87 - 5.11 MIL/uL   Hemoglobin 12.2 12.0 - 15.0 g/dL   HCT 16.1 09.6 - 04.5 %   MCV 82.7 80.0 - 100.0 fL   MCH 26.7 26.0 - 34.0 pg   MCHC 32.3 30.0 - 36.0 g/dL   RDW 40.9 81.1 - 91.4 %   Platelets 239 150 - 400 K/uL   nRBC 0.0 0.0 - 0.2 %  Comprehensive metabolic panel  Result Value Ref Range   Sodium 142 135 - 145 mmol/L   Potassium 3.4 (L) 3.5 - 5.1 mmol/L   Chloride 105 98 - 111 mmol/L   CO2 27 22 - 32 mmol/L   Glucose, Bld 117 (H) 70 - 99 mg/dL   BUN 15 6 - 20 mg/dL   Creatinine, Ser 7.82 0.44 - 1.00 mg/dL   Calcium 8.7 (L) 8.9 - 10.3 mg/dL   Total Protein 7.1 6.5 - 8.1 g/dL   Albumin 4.1 3.5 - 5.0 g/dL   AST 22 15 - 41 U/L   ALT 22 0 - 44 U/L   Alkaline Phosphatase 95 38 - 126 U/L   Total Bilirubin 0.7 0.3 - 1.2 mg/dL   GFR calc non Af Amer >60 >60 mL/min   GFR calc Af Amer >60 >60 mL/min   Anion gap 10 5 - 15  Lipase, blood  Result Value Ref Range   Lipase 21 11 - 51 U/L  Pregnancy, urine POC  Result Value Ref Range   Preg Test, Ur NEGATIVE NEGATIVE

## 2019-08-27 ENCOUNTER — Emergency Department
Admission: EM | Admit: 2019-08-27 | Discharge: 2019-08-27 | Disposition: A | Discharge status: Home / Self Care | Payer: Self-pay | Attending: Emergency Medicine | Admitting: Emergency Medicine

## 2019-08-27 ENCOUNTER — Other Ambulatory Visit: Payer: Self-pay

## 2019-08-27 ENCOUNTER — Encounter: Payer: Self-pay | Admitting: Emergency Medicine

## 2019-08-27 ENCOUNTER — Emergency Department: Payer: Self-pay

## 2019-08-27 DIAGNOSIS — M722 Plantar fascial fibromatosis: Secondary | ICD-10-CM | POA: Insufficient documentation

## 2019-08-27 MED ORDER — MELOXICAM 15 MG PO TABS: 15.0000 mg | ORAL_TABLET | Freq: Every day | ORAL | 1 refills | Status: AC

## 2019-08-27 NOTE — Discharge Instructions (Signed)
Use high arch shoes. Use a Dr. Felicie Morn insert to provide additional arch support. Use ice along bottom of foot nightly. Take meloxicam daily for pain and inflammation.

## 2019-08-27 NOTE — ED Triage Notes (Signed)
Presents with pain to right foot  States she was standing on it all day at work  Noticed some swelling and having pain to lateral aspect of foot  Denies any injury

## 2019-08-27 NOTE — ED Provider Notes (Signed)
[facility] Emergency Department Provider Note  ____________________________________________  Time seen: Approximately 9:36 PM  I have reviewed the triage vital signs and the nursing notes.   HISTORY  Chief Complaint Foot Pain    HPI Bianca Hayes is a 28 y.o. female presents to the emergency department with pain along the plantar aspect of the right foot.  Patient states that pain is worse with prolonged standing position.  She reports that she typically wears shoes with a nonsupportive arch like crocs.  No right foot trauma.  No numbness and tingling in the right foot.  No other alleviating measures have been attempted.        Past Medical History:  Diagnosis Date  . Anemia   . Ovarian cyst     There are no active problems to display for this patient.   Past Surgical History:  Procedure Laterality Date  . DENTAL SURGERY      Prior to Admission medications   Medication Sig Start Date End Date Taking? Authorizing Provider  meloxicam (MOBIC) 15 MG tablet Take 1 tablet (15 mg total) by mouth daily for 7 days. 08/27/19 09/03/19  Lannie Fields, PA-C  pantoprazole (PROTONIX) 40 MG tablet Take 1 tablet (40 mg total) by mouth daily. 03/29/18 03/29/19  Earleen Newport, MD  UNKNOWN TO PATIENT Take 1 tablet by mouth daily.    [provider]    Allergies Iodine, Bactrim [sulfamethoxazole-trimethoprim], and Penicillins  No family history on file.  Social History Social History   Tobacco Use  . Smoking status: Never Smoker  . Smokeless tobacco: Never Used  Substance Use Topics  . Alcohol use: No  . Drug use: No     Review of Systems  Constitutional: No fever/chills Eyes: No visual changes. No discharge ENT: No upper respiratory complaints. Cardiovascular: no chest pain. Respiratory: no cough. No SOB. Gastrointestinal: No abdominal pain.  No nausea, no vomiting.  No diarrhea.  No constipation. Musculoskeletal: Patient has  right foot pain.  Skin: Negative for rash, abrasions, lacerations, ecchymosis. Neurological: Negative for headaches, focal weakness or numbness.   ____________________________________________   PHYSICAL EXAM:  VITAL SIGNS: ED Triage Vitals [08/27/19 1635]  Enc Vitals Group     BP (!) 160/89     Pulse Rate (!) 116     Resp 18     Temp 98.7 F (37.1 C)     Temp Source Oral     SpO2 99 %     Weight (!) 310 lb (140.6 kg)     Height 5\' 9"  (1.753 m)     Head Circumference      Peak Flow      Pain Score 10     Pain Loc      Pain Edu?      Excl. in [place]?      Constitutional: Alert and oriented. Well appearing and in no acute distress. Eyes: Conjunctivae are normal. PERRL. EOMI. Head: Atraumatic. ENT: Cardiovascular: Normal rate, regular rhythm. Normal S1 and S2.  Good peripheral circulation. Respiratory: Normal respiratory effort without tachypnea or retractions. Lungs CTAB. Good air entry to the bases with no decreased or absent breath sounds. Musculoskeletal: Patient has pain to palpation along the plantar aspect of the right foot in the distribution of the plantar fascia.  Patient has full range of motion of the right ankle and is able to move all 5 right toes.  No tenderness to palpation with squeeze test.  Palpable dorsalis pedis pulse, right.  Neurologic:  Normal speech and language. No gross focal neurologic deficits are appreciated.  Skin:  Skin is warm, dry and intact. No rash noted. Psychiatric: Mood and affect are normal. Speech and behavior are normal. Patient exhibits appropriate insight and judgement.   ____________________________________________   LABS (all labs ordered are listed, but only abnormal results are displayed)  Labs Reviewed - No data to display ____________________________________________  EKG   ____________________________________________  RADIOLOGY I personally viewed and evaluated these images as part of my medical decision making, as well  as reviewing the written report by the radiologist.  Dg Foot Complete Right  Result Date: 08/27/2019 CLINICAL DATA:  Right foot pain and bruising along the small toe for the past day. No injury. EXAM: RIGHT FOOT COMPLETE - 3+ VIEW COMPARISON:  None. FINDINGS: No acute fracture or dislocation. Joint spaces are preserved. Dorsal talonavicular spurring. Bone mineralization is normal. Soft tissues are unremarkable. IMPRESSION: No acute osseous abnormality. Electronically Signed   By: Obie Dredge M.D.   On: 08/27/2019 18:02    ____________________________________________    PROCEDURES  Procedure(s) performed:    Procedures    Medications - No data to display   ____________________________________________   INITIAL IMPRESSION / ASSESSMENT AND PLAN / ED COURSE  Pertinent labs & imaging results that were available during my care of the patient were reviewed by me and considered in my medical decision making (see chart for details).  Review of the [place] CSRS was performed in accordance of the [facility] prior to dispensing any controlled drugs.           Assessment and plan Plantar fasciitis 28 year old female presents to the emergency department with pain to palpation along the plantar fascia.  History and physical exam findings suggest plantar fasciitis.  No bony abnormality was visualized on x-ray examination of the right foot.  Advised patient to wear shoes with arch suppors and to use ice nightly.  Patient was started on meloxicam.  She was advised to follow-up with podiatry if symptoms persist. All patient questions were answered.     ____________________________________________  FINAL CLINICAL IMPRESSION(S) / ED DIAGNOSES  Final diagnoses:  Plantar fasciitis      NEW MEDICATIONS STARTED DURING THIS VISIT:  ED Discharge Orders         Ordered    meloxicam (MOBIC) 15 MG tablet  Daily     08/27/19 1859              This chart was dictated using voice  recognition software/Dragon. Despite best efforts to proofread, errors can occur which can change the meaning. Any change was purely unintentional.    Orvil Feil, PA-C 08/27/19 2141    Minna Antis, MD 08/27/19 2231

## 2019-08-27 NOTE — ED Triage Notes (Signed)
First RN Note: Pt presents to ED via POV with c/o injury and bruising to R foot at this time. Pt ambulatory across the lobby without difficulty.

## 2020-02-18 ENCOUNTER — Encounter: Payer: Self-pay | Admitting: Emergency Medicine

## 2020-02-18 ENCOUNTER — Emergency Department: Payer: Self-pay

## 2020-02-18 ENCOUNTER — Other Ambulatory Visit: Payer: Self-pay

## 2020-02-18 ENCOUNTER — Emergency Department
Admission: EM | Admit: 2020-02-18 | Discharge: 2020-02-18 | Disposition: A | Discharge status: Home / Self Care | Payer: Self-pay | Attending: Emergency Medicine | Admitting: Emergency Medicine

## 2020-02-18 DIAGNOSIS — Z79899 Other long term (current) drug therapy: Secondary | ICD-10-CM | POA: Insufficient documentation

## 2020-02-18 DIAGNOSIS — R109 Unspecified abdominal pain: Secondary | ICD-10-CM

## 2020-02-18 DIAGNOSIS — N83201 Unspecified ovarian cyst, right side: Secondary | ICD-10-CM | POA: Insufficient documentation

## 2020-02-18 LAB — URINALYSIS, COMPLETE (UACMP) WITH MICROSCOPIC
Bilirubin Urine: NEGATIVE
Glucose, UA: NEGATIVE mg/dL
Hgb urine dipstick: NEGATIVE
Ketones, ur: NEGATIVE mg/dL
Nitrite: NEGATIVE
Protein, ur: 30 mg/dL — AB
Specific Gravity, Urine: 1.028 (ref 1.005–1.030)
pH: 5 (ref 5.0–8.0)

## 2020-02-18 LAB — BASIC METABOLIC PANEL
Anion gap: 9 (ref 5–15)
BUN: 16 mg/dL (ref 6–20)
CO2: 25 mmol/L (ref 22–32)
Calcium: 8.6 mg/dL — ABNORMAL LOW (ref 8.9–10.3)
Chloride: 106 mmol/L (ref 98–111)
Creatinine, Ser: 0.59 mg/dL (ref 0.44–1.00)
GFR calc Af Amer: >60 mL/min (ref >60)
GFR calc non Af Amer: >60 mL/min (ref >60)
Glucose, Bld: 98 mg/dL (ref 70–99)
Potassium: 3.8 mmol/L (ref 3.5–5.1)
Sodium: 140 mmol/L (ref 135–145)

## 2020-02-18 LAB — CBC
HCT: 40.5 % (ref 36.0–46.0)
Hemoglobin: 13.1 g/dL (ref 12.0–15.0)
MCH: 27.6 pg (ref 26.0–34.0)
MCHC: 32.3 g/dL (ref 30.0–36.0)
MCV: 85.3 fL (ref 80.0–100.0)
Platelets: 252 10*3/uL (ref 150–400)
RBC: 4.75 MIL/uL (ref 3.87–5.11)
RDW: 14.6 % (ref 11.5–15.5)
WBC: 6.7 10*3/uL (ref 4.0–10.5)
nRBC: 0 % (ref 0.0–0.2)

## 2020-02-18 LAB — PREGNANCY, URINE: Preg Test, Ur: NEGATIVE

## 2020-02-18 LAB — SAMPLE TO BLOOD BANK

## 2020-02-18 LAB — POCT PREGNANCY, URINE: Preg Test, Ur: NEGATIVE

## 2020-02-18 MED ORDER — NAPROXEN 500 MG PO TABS: 500.0000 mg | ORAL_TABLET | Freq: Two times a day (BID) | ORAL | 0 refills | Status: DC

## 2020-02-18 MED ORDER — KETOROLAC TROMETHAMINE 60 MG/2ML IM SOLN
30.0000 mg | Freq: Once | INTRAMUSCULAR | Status: AC
  Administered 2020-02-18: 30 mg via INTRAMUSCULAR
  Filled 2020-02-18: qty 2

## 2020-02-18 MED ORDER — OXYCODONE-ACETAMINOPHEN 5-325 MG PO TABS
2.0000 | ORAL_TABLET | Freq: Once | ORAL | Status: AC
  Administered 2020-02-18: 21:00:00 2 via ORAL
  Filled 2020-02-18: qty 2

## 2020-02-18 MED ORDER — MEDROXYPROGESTERONE ACETATE 10 MG PO TABS: 20.0000 mg | ORAL_TABLET | Freq: Every day | ORAL | 0 refills | Status: DC

## 2020-02-18 MED ORDER — ACETAMINOPHEN 325 MG PO TABS: 650.0000 mg | ORAL_TABLET | Freq: Four times a day (QID) | ORAL | 0 refills | Status: DC | PRN

## 2020-02-18 NOTE — Discharge Instructions (Signed)
Your CT scan shows a 7cm cyst on your right ovary, which is unlikely to be causing your pain today.  We were unable to identify a specific cause of your symptoms, but your evaluation today was reassuring. Please follow up with gynecology for further evaluation of your irregular bleeding and ovarian cyst.

## 2020-02-18 NOTE — ED Triage Notes (Signed)
Pt presents to ED c/o L flank pain. States she was treated for kidney infection 1 month ago, completed antibiotics but states symptoms never got better. Pt also reports "burning pain in my arms and legs" and vaginal bleeding x3 wks, states she is wearing adult diapers d/t amount of bleeding. Has Nexplanon implant, states it was supposed to be replaced in Feb but d/t Covid pt has not been able to get it changed.

## 2020-02-18 NOTE — ED Provider Notes (Signed)
[facility] Emergency Department Provider Note  ____________________________________________  Time seen: Approximately 8:38 PM  I have reviewed the triage vital signs and the nursing notes.   HISTORY  Chief Complaint Flank Pain    HPI Bianca Hayes is a 29 y.o. female with a past history of ovarian cyst who comes the ED complaining of left flank pain, nonradiating, no aggravating or alleviating factors.  Gradual onset over the past 2 days, waxing and waning.  Also felt like she had some hematuria, but she also notes that she has been having irregular vaginal bleeding over the past 3 weeks which is now decreased to just spotting.  Denies chest pain shortness of breath lightheadedness or syncope.  Not on any blood thinners.  She does have a Nexplanon, which was due to be replaced 2 months ago.  Denies any abnormal vaginal discharge or pelvic pain.      Past Medical History:  Diagnosis Date  . Anemia   . Ovarian cyst      There are no problems to display for this patient.    Past Surgical History:  Procedure Laterality Date  . DENTAL SURGERY       Prior to Admission medications   Medication Sig Start Date End Date Taking? Authorizing Provider  acetaminophen (TYLENOL) 325 MG tablet Take 2 tablets (650 mg total) by mouth every 6 (six) hours as needed. 02/18/20   Sharman Cheek, MD  medroxyPROGESTERone (PROVERA) 10 MG tablet Take 2 tablets (20 mg total) by mouth daily. 02/18/20   Sharman Cheek, MD  naproxen (NAPROSYN) 500 MG tablet Take 1 tablet (500 mg total) by mouth 2 (two) times daily with a meal. 02/18/20   Sharman Cheek, MD  pantoprazole (PROTONIX) 40 MG tablet Take 1 tablet (40 mg total) by mouth daily. 03/29/18 03/29/19  Emily Filbert, MD  UNKNOWN TO PATIENT Take 1 tablet by mouth daily.    [provider]     Allergies Iodine, Bactrim [sulfamethoxazole-trimethoprim], and Penicillins   History reviewed. No  pertinent family history.  Social History Social History   Tobacco Use  . Smoking status: Never Smoker  . Smokeless tobacco: Never Used  Substance Use Topics  . Alcohol use: No  . Drug use: No    Review of Systems  Constitutional:   No fever or chills.  ENT:   No sore throat. No rhinorrhea. Cardiovascular:   No chest pain or syncope. Respiratory:   No dyspnea or cough. Gastrointestinal:   Negative for abdominal pain, vomiting and diarrhea.  Musculoskeletal: Positive as above for left flank pain. All other systems reviewed and are negative except as documented above in ROS and HPI.  ____________________________________________   PHYSICAL EXAM:  VITAL SIGNS: ED Triage Vitals  Enc Vitals Group     BP 02/18/20 1415 (!) 149/80     Pulse Rate 02/18/20 1415 94     Resp 02/18/20 1415 18     Temp 02/18/20 1415 98.7 F (37.1 C)     Temp Source 02/18/20 1415 Oral     SpO2 02/18/20 1415 100 %     Weight 02/18/20 1415 (!) 350 lb (158.8 kg)     Height 02/18/20 1415 5\' 9"  (1.753 m)     Head Circumference --      Peak Flow --      Pain Score 02/18/20 1416 10     Pain Loc --      Pain Edu? --      Excl. in  [place]? --     Vital signs reviewed, nursing assessments reviewed.   Constitutional:   Alert and oriented. Non-toxic appearance. Eyes:   Conjunctivae are normal. EOMI. PERRL. ENT      Head:   Normocephalic and atraumatic.      Nose:   Wearing a mask.      Mouth/Throat:   Wearing a mask.      Neck:   No meningismus. Full ROM. Hematological/Lymphatic/Immunilogical:   No cervical lymphadenopathy. Cardiovascular:   RRR. Symmetric bilateral radial and DP pulses.  No murmurs. Cap refill less than 2 seconds. Respiratory:   Normal respiratory effort without tachypnea/retractions. Breath sounds are clear and equal bilaterally. No wheezes/rales/rhonchi. Gastrointestinal:   Soft and nontender. Non distended. There is no CVA tenderness.  No rebound, rigidity, or  guarding. Musculoskeletal:   Normal range of motion in all extremities. No joint effusions.  No lower extremity tenderness.  No edema. Neurologic:   Normal speech and language.  Motor grossly intact. No acute focal neurologic deficits are appreciated.  Skin:    Skin is warm, dry and intact. No rash noted.  No petechiae, purpura, or bullae.  ____________________________________________    LABS (pertinent positives/negatives) (all labs ordered are listed, but only abnormal results are displayed) Labs Reviewed  URINALYSIS, COMPLETE (UACMP) WITH MICROSCOPIC - Abnormal; Notable for the following components:      Result Value   Color, Urine YELLOW (*)    APPearance CLOUDY (*)    Protein, ur 30 (*)    Leukocytes,Ua LARGE (*)    Bacteria, UA FEW (*)    All other components within normal limits  BASIC METABOLIC PANEL - Abnormal; Notable for the following components:   Calcium 8.6 (*)    All other components within normal limits  URINE CULTURE  CBC  PREGNANCY, URINE  POCT PREGNANCY, URINE  SAMPLE TO BLOOD BANK   ____________________________________________   EKG    ____________________________________________    RADIOLOGY  No results found. CLINICAL DATA: Left flank pain  EXAM: CT ABDOMEN AND PELVIS WITHOUT CONTRAST  TECHNIQUE: Multidetector CT imaging of the abdomen and pelvis was performed following the standard protocol without IV contrast.  COMPARISON: None.  FINDINGS: LOWER CHEST: Normal.  HEPATOBILIARY: Normal hepatic contours. No intra- or extrahepatic biliary dilatation. The gallbladder is normal.  PANCREAS: Normal pancreas. No ductal dilatation or peripancreatic fluid collection.  SPLEEN: Spleen is enlarged, measuring 15.2 cm in craniocaudal dimension.  ADRENALS/URINARY TRACT: The adrenal glands are normal. No hydronephrosis, nephroureterolithiasis or solid renal mass. The urinary bladder is normal for degree of distention  STOMACH/BOWEL: There is  no hiatal hernia. Normal duodenal course and caliber. No small bowel dilatation or inflammation. No focal colonic abnormality. Normal appendix.  VASCULAR/LYMPHATIC: Normal course and caliber of the major abdominal vessels. No abdominal or pelvic lymphadenopathy.  REPRODUCTIVE: Normal uterus. 7.6 cm right adnexal cyst.  MUSCULOSKELETAL. No bony spinal canal stenosis or focal osseous abnormality. T11-12 degenerative disc disease.  OTHER: None.  IMPRESSION: 1. No obstructive uropathy or nephrolithiasis. 2. Splenomegaly. 3. 7.6 cm right adnexal cyst. Follow-up pelvic ultrasound recommended in 6-12 weeks. This recommendation follows ACR consensus guidelines: White Paper of the ACR Incidental Findings Committee II on Adnexal Findings. J Am Coll Radiol [phone].   Electronically Signed By: Deatra Robinson M.D. On: 02/18/2020 19:22 ____________________________________________   PROCEDURES Procedures  ____________________________________________  DIFFERENTIAL DIAGNOSIS   Ureterolithiasis, left ovarian cyst, diverticulitis, cystitis  CLINICAL IMPRESSION / ASSESSMENT AND PLAN / ED COURSE  Medications ordered in the ED: Medications  ketorolac (TORADOL) injection 30 mg (30 mg Intramuscular Given 02/18/20 1852)    Pertinent labs & imaging results that were available during my care of the patient were reviewed by me and considered in my medical decision making (see chart for details).  Bianca Hayes was evaluated in Emergency Department on 02/18/2020 for the symptoms described in the history of present illness. She was evaluated in the context of the global COVID-19 pandemic, which necessitated consideration that the patient might be at risk for infection with the SARS-CoV-2 virus that causes COVID-19. Institutional protocols and algorithms that pertain to the evaluation of patients at risk for COVID-19 are in a state of rapid change based on information released by regulatory  bodies including the CDC and federal and state organizations. These policies and algorithms were followed during the patient's care in the ED.   Patient presents with left flank pain, irregular vaginal bleeding.  Vital signs are normal, patient is nontoxic with reassuring exam.  Labs including chemistry and blood counts unremarkable.  Urinalysis somewhat equivocal for UTI, but overall unlikely.  Follow-up urine culture sent to lab.   Considering the patient's symptoms, medical history, and physical examination today, I have low suspicion for cholecystitis or biliary pathology, pancreatitis, perforation or bowel obstruction, hernia, intra-abdominal abscess, AAA or dissection, volvulus or intussusception, mesenteric ischemia, or appendicitis.    Clinical Course as of Feb 17 2037  Thu Feb 18, 2020  2034 Labs and CT scan unremarkable. Will instruct pt to f/u with gyn for metrorrhagia and right ovarian cyst. Doubt STI, PID, TOA, torsion. Not pregnant. Not anemic, no signs of shock. Medically stable for outpt f/u.    [PS]    Clinical Course User Index [PS] Carrie Mew, MD     ____________________________________________   FINAL CLINICAL IMPRESSION(S) / ED DIAGNOSES    Final diagnoses:  Acute left flank pain  Right ovarian cyst     ED Discharge Orders         Ordered    naproxen (NAPROSYN) 500 MG tablet  2 times daily with meals     02/18/20 2038    acetaminophen (TYLENOL) 325 MG tablet  Every 6 hours PRN     02/18/20 2038    medroxyPROGESTERone (PROVERA) 10 MG tablet  Daily     02/18/20 2038          Portions of this note were generated with dragon dictation software. Dictation errors may occur despite best attempts at proofreading.   Carrie Mew, MD 02/18/20 2041

## 2020-02-20 LAB — URINE CULTURE

## 2020-02-28 ENCOUNTER — Emergency Department
Admission: EM | Admit: 2020-02-28 | Discharge: 2020-02-29 | Disposition: A | Discharge status: Home / Self Care | Payer: Self-pay | Attending: Emergency Medicine | Admitting: Emergency Medicine

## 2020-02-28 ENCOUNTER — Other Ambulatory Visit: Payer: Self-pay

## 2020-02-28 DIAGNOSIS — Z79899 Other long term (current) drug therapy: Secondary | ICD-10-CM | POA: Insufficient documentation

## 2020-02-28 DIAGNOSIS — R102 Pelvic and perineal pain: Secondary | ICD-10-CM

## 2020-02-28 DIAGNOSIS — N12 Tubulo-interstitial nephritis, not specified as acute or chronic: Secondary | ICD-10-CM

## 2020-02-28 DIAGNOSIS — N10 Acute tubulo-interstitial nephritis: Secondary | ICD-10-CM | POA: Insufficient documentation

## 2020-02-28 LAB — URINALYSIS, COMPLETE (UACMP) WITH MICROSCOPIC
Bilirubin Urine: NEGATIVE
Glucose, UA: NEGATIVE mg/dL
Ketones, ur: NEGATIVE mg/dL
Nitrite: NEGATIVE
Protein, ur: NEGATIVE mg/dL
Specific Gravity, Urine: 1.025 (ref 1.005–1.030)
pH: 5.5 (ref 5.0–8.0)

## 2020-02-28 LAB — COMPREHENSIVE METABOLIC PANEL
ALT: 24 U/L (ref 0–44)
AST: 21 U/L (ref 15–41)
Albumin: 3.9 g/dL (ref 3.5–5.0)
Alkaline Phosphatase: 81 U/L (ref 38–126)
Anion gap: 7 (ref 5–15)
BUN: 12 mg/dL (ref 6–20)
CO2: 27 mmol/L (ref 22–32)
Calcium: 8.7 mg/dL — ABNORMAL LOW (ref 8.9–10.3)
Chloride: 104 mmol/L (ref 98–111)
Creatinine, Ser: 0.77 mg/dL (ref 0.44–1.00)
GFR calc Af Amer: >60 mL/min (ref >60)
GFR calc non Af Amer: >60 mL/min (ref >60)
Glucose, Bld: 104 mg/dL — ABNORMAL HIGH (ref 70–99)
Potassium: 3.8 mmol/L (ref 3.5–5.1)
Sodium: 138 mmol/L (ref 135–145)
Total Bilirubin: 1 mg/dL (ref 0.3–1.2)
Total Protein: 6.9 g/dL (ref 6.5–8.1)

## 2020-02-28 LAB — CBC
HCT: 36.4 % (ref 36.0–46.0)
Hemoglobin: 12.3 g/dL (ref 12.0–15.0)
MCH: 27.9 pg (ref 26.0–34.0)
MCHC: 33.8 g/dL (ref 30.0–36.0)
MCV: 82.5 fL (ref 80.0–100.0)
Platelets: 223 10*3/uL (ref 150–400)
RBC: 4.41 MIL/uL (ref 3.87–5.11)
RDW: 14.5 % (ref 11.5–15.5)
WBC: 8.1 10*3/uL (ref 4.0–10.5)
nRBC: 0 % (ref 0.0–0.2)

## 2020-02-28 LAB — LIPASE, BLOOD: Lipase: 17 U/L (ref 11–51)

## 2020-02-28 LAB — WET PREP, GENITAL
Clue Cells Wet Prep HPF POC: NONE SEEN
Sperm: NONE SEEN
Trich, Wet Prep: NONE SEEN
Yeast Wet Prep HPF POC: NONE SEEN

## 2020-02-28 LAB — POCT PREGNANCY, URINE: Preg Test, Ur: NEGATIVE

## 2020-02-28 MED ORDER — LACTATED RINGERS IV BOLUS
1000.0000 mL | Freq: Once | INTRAVENOUS | Status: AC
  Administered 2020-02-28: 1000 mL via INTRAVENOUS

## 2020-02-28 MED ORDER — MORPHINE SULFATE (PF) 4 MG/ML IV SOLN
4.0000 mg | Freq: Once | INTRAVENOUS | Status: AC
  Administered 2020-02-29: 4 mg via INTRAVENOUS
  Filled 2020-02-28: qty 1

## 2020-02-28 MED ORDER — ONDANSETRON HCL 4 MG/2ML IJ SOLN
4.0000 mg | Freq: Once | INTRAMUSCULAR | Status: AC
  Administered 2020-02-29: 4 mg via INTRAVENOUS
  Filled 2020-02-28: qty 2

## 2020-02-28 NOTE — ED Provider Notes (Signed)
[facility] Emergency Department Provider Note   ____________________________________________   First MD Initiated Contact with Patient 02/28/20 2300     (approximate)  I have reviewed the triage vital signs and the nursing notes.   HISTORY  Chief Complaint Abdominal Pain    HPI Bianca Hayes is a 29 y.o. female with past medical history of ovarian cyst who presents to the ED complaining of abdominal pain.  Patient reports she has been having almost a week of left lower quadrant abdominal pain.  She describes it as constant and sharp, not exacerbated or alleviated by anything.  It has been worsening following her ED visit last week, when she was diagnosed with an ovarian cyst.  She has not yet been able to follow-up with OB/GYN and now states that she is having vomiting as well as a temp of 100.4 earlier today.  She endorses some dysuria, but denies any hematuria, vaginal bleeding, or vaginal discharge.  She has not been taking anything for her symptoms at home.        Past Medical History:  Diagnosis Date  . Anemia   . Ovarian cyst     There are no problems to display for this patient.   Past Surgical History:  Procedure Laterality Date  . DENTAL SURGERY      Prior to Admission medications   Medication Sig Start Date End Date Taking? Authorizing Provider  acetaminophen (TYLENOL) 325 MG tablet Take 2 tablets (650 mg total) by mouth every 6 (six) hours as needed. 02/18/20   Sharman Cheek, MD  cephALEXin (KEFLEX) 500 MG capsule Take 2 capsules (1,000 mg total) by mouth 2 (two) times daily for 10 days. 02/29/20 03/10/20  Chesley Noon, MD  medroxyPROGESTERone (PROVERA) 10 MG tablet Take 2 tablets (20 mg total) by mouth daily. 02/18/20   Sharman Cheek, MD  naproxen (NAPROSYN) 500 MG tablet Take 1 tablet (500 mg total) by mouth 2 (two) times daily with a meal. 02/18/20   Sharman Cheek, MD  ondansetron (ZOFRAN ODT) 4 MG disintegrating tablet  Take 1 tablet (4 mg total) by mouth every 8 (eight) hours as needed for nausea or vomiting. 02/29/20   Chesley Noon, MD  pantoprazole (PROTONIX) 40 MG tablet Take 1 tablet (40 mg total) by mouth daily. 03/29/18 03/29/19  Emily Filbert, MD  UNKNOWN TO PATIENT Take 1 tablet by mouth daily.    [provider]    Allergies Iodine, Bactrim [sulfamethoxazole-trimethoprim], and Penicillins  No family history on file.  Social History Social History   Tobacco Use  . Smoking status: Never Smoker  . Smokeless tobacco: Never Used  Substance Use Topics  . Alcohol use: No  . Drug use: No    Review of Systems  Constitutional: No fever/chills Eyes: No visual changes. ENT: No sore throat. Cardiovascular: Denies chest pain. Respiratory: Denies shortness of breath. Gastrointestinal: Positive for abdominal pain.  Positive for nausea and vomiting.  No diarrhea.  No constipation. Genitourinary: Positive for dysuria. Musculoskeletal: Negative for back pain. Skin: Negative for rash. Neurological: Negative for headaches, focal weakness or numbness.  ____________________________________________   PHYSICAL EXAM:  VITAL SIGNS: ED Triage Vitals [02/28/20 1902]  Enc Vitals Group     BP 128/88     Pulse Rate (!) 105     Resp 20     Temp 98.8 F (37.1 C)     Temp src      SpO2 100 %     Weight (!) 349  lb 15.7 oz (158.7 kg)     Height 5\' 9"  (1.753 m)     Head Circumference      Peak Flow      Pain Score 10     Pain Loc      Pain Edu?      Excl. in [place]?     Constitutional: Alert and oriented. Eyes: Conjunctivae are normal. Head: Atraumatic. Nose: No congestion/rhinnorhea. Mouth/Throat: Mucous membranes are moist. Neck: Normal ROM Cardiovascular: Normal rate, regular rhythm. Grossly normal heart sounds. Respiratory: Normal respiratory effort.  No retractions. Lungs CTAB. Gastrointestinal: Soft and tender to palpation in the left lower quadrant and suprapubic area, no  rebound or guarding.  CVA tenderness noted on left.  No distention. Genitourinary: No discharge noted, cervical os closed. No cervical motion or adnexal tenderness. Musculoskeletal: No lower extremity tenderness nor edema. Neurologic:  Normal speech and language. No gross focal neurologic deficits are appreciated. Skin:  Skin is warm, dry and intact. No rash noted. Psychiatric: Mood and affect are normal. Speech and behavior are normal.  ____________________________________________   LABS (all labs ordered are listed, but only abnormal results are displayed)  Labs Reviewed  WET PREP, GENITAL - Abnormal; Notable for the following components:      Result Value   WBC, Wet Prep HPF POC FEW (*)    All other components within normal limits  COMPREHENSIVE METABOLIC PANEL - Abnormal; Notable for the following components:   Glucose, Bld 104 (*)    Calcium 8.7 (*)    All other components within normal limits  URINALYSIS, COMPLETE (UACMP) WITH MICROSCOPIC - Abnormal; Notable for the following components:   APPearance CLOUDY (*)    Hgb urine dipstick TRACE (*)    Leukocytes,Ua LARGE (*)    Bacteria, UA RARE (*)    All other components within normal limits  CHLAMYDIA/NGC RT PCR ([facility] ONLY)  URINE CULTURE  LIPASE, BLOOD  CBC  POC URINE PREG, ED  POCT PREGNANCY, URINE     PROCEDURES  Procedure(s) performed (including Critical Care):  Procedures   ____________________________________________   INITIAL IMPRESSION / ASSESSMENT AND PLAN / ED COURSE       29 year old female with history of ovarian cyst presents to the ED complaining of worsening left lower quadrant abdominal pain as well as vomiting with temperature of 100.4 earlier today.  She does endorse some urinary symptoms, which along with her CVA tenderness suggest possible pyelonephritis.  UA does suggest infection, which could explain her ongoing symptoms.  She had no evidence of kidney stone on prior CT and I doubt  infected stone.  Pelvic exam is unremarkable and I doubt PID, wet prep pending.  We will plan to perform ultrasound to rule out ovarian torsion, although her cyst is known to be on the right side and unlikely to cause pain on the left.  Remainder of lab work is unremarkable, we will treat symptomatically with fluids, morphine, and Zofran.  Ultrasound redemonstrates large right-sided ovarian cyst with no evidence of torsion bilaterally.  She was again advised to follow-up with OB/GYN for this, but it appears that UTI and potentially pyelonephritis are more likely to be causing her symptoms today.  Urine culture was sent and she was given initial dose of Rocephin.  Will prescribe Keflex and Zofran, patient counseled to return to the ED for new worsening symptoms.  Patient agrees with plan.      ____________________________________________   FINAL CLINICAL IMPRESSION(S) / ED DIAGNOSES  Final diagnoses:  Pelvic  pain  Pyelonephritis     ED Discharge Orders         Ordered    cephALEXin (KEFLEX) 500 MG capsule  2 times daily     02/29/20 0306    ondansetron (ZOFRAN ODT) 4 MG disintegrating tablet  Every 8 hours PRN     02/29/20 0306           Note:  This document was prepared using Dragon voice recognition software and may include unintentional dictation errors.   Chesley Noon, MD 02/29/20 [phone]

## 2020-02-28 NOTE — ED Triage Notes (Signed)
Patient c/o LLQ abdominal pain. Patient dx with cyst on left ovary in this ED last week. Patient reports being febrile at home today. Patient reports 5 emeses in the last 24 hours.

## 2020-02-29 ENCOUNTER — Emergency Department: Payer: Self-pay

## 2020-02-29 LAB — CHLAMYDIA/NGC RT PCR (ARMC ONLY)
Chlamydia Tr: NOT DETECTED
N gonorrhoeae: NOT DETECTED

## 2020-02-29 MED ORDER — SODIUM CHLORIDE 0.9 % IV SOLN
1.0000 g | Freq: Once | INTRAVENOUS | Status: AC
  Administered 2020-02-29: 1 g via INTRAVENOUS
  Filled 2020-02-29: qty 10

## 2020-02-29 MED ORDER — CEPHALEXIN 500 MG PO CAPS: 1000.0000 mg | ORAL_CAPSULE | Freq: Two times a day (BID) | ORAL | 0 refills | Status: AC

## 2020-02-29 MED ORDER — ONDANSETRON 4 MG PO TBDP: 4.0000 mg | ORAL_TABLET | Freq: Three times a day (TID) | ORAL | 0 refills | Status: DC | PRN

## 2020-02-29 NOTE — ED Notes (Signed)
Attempted to have patient sign for dc instructions. Equipment malfunctioned

## 2020-03-01 ENCOUNTER — Telehealth: Payer: Self-pay | Admitting: Family Medicine

## 2020-03-01 LAB — URINE CULTURE: Culture: 30000 — AB

## 2020-03-01 NOTE — Telephone Encounter (Signed)
Please call pt for a Nexplanon replacement appt last physical on 12/05/2017

## 2020-03-01 NOTE — Telephone Encounter (Signed)
Last [facility] physical was 12/05/2017 and Nexplanon inserted 12/12/2017 at [facility]. Call to client and left message that Nexplanon removal due 12/05/2020 as only in for 2 years. Number to call for physical appt provided. Jossie Ng, RN

## 2021-01-04 ENCOUNTER — Other Ambulatory Visit: Payer: Self-pay

## 2021-01-04 ENCOUNTER — Encounter (HOSPITAL_COMMUNITY): Payer: Self-pay

## 2021-01-04 ENCOUNTER — Emergency Department (HOSPITAL_COMMUNITY)
Admission: EM | Admit: 2021-01-04 | Discharge: 2021-01-04 | Disposition: A | Discharge status: Home / Self Care | Payer: Medicaid Other | Attending: Emergency Medicine | Admitting: Emergency Medicine

## 2021-01-04 DIAGNOSIS — J392 Other diseases of pharynx: Secondary | ICD-10-CM | POA: Insufficient documentation

## 2021-01-04 DIAGNOSIS — Z91018 Allergy to other foods: Secondary | ICD-10-CM | POA: Insufficient documentation

## 2021-01-04 DIAGNOSIS — T7840XA Allergy, unspecified, initial encounter: Secondary | ICD-10-CM

## 2021-01-04 DIAGNOSIS — T781XXA Other adverse food reactions, not elsewhere classified, initial encounter: Secondary | ICD-10-CM | POA: Insufficient documentation

## 2021-01-04 MED ORDER — METHYLPREDNISOLONE SODIUM SUCC 125 MG IJ SOLR
125.0000 mg | Freq: Once | INTRAMUSCULAR | Status: AC
  Administered 2021-01-04: 125 mg via INTRAVENOUS
  Filled 2021-01-04: qty 2

## 2021-01-04 MED ORDER — PREDNISONE 20 MG PO TABS: 40.0000 mg | ORAL_TABLET | Freq: Every day | ORAL | 0 refills | Status: AC

## 2021-01-04 MED ORDER — FAMOTIDINE IN NACL 20-0.9 MG/50ML-% IV SOLN
20.0000 mg | Freq: Once | INTRAVENOUS | Status: AC
  Administered 2021-01-04: 20 mg via INTRAVENOUS
  Filled 2021-01-04: qty 50

## 2021-01-04 NOTE — ED Provider Notes (Signed)
[facility] EMERGENCY DEPARTMENT Provider Note   CSN: [id number] Arrival date & time: 01/04/21  1545     History Chief Complaint  Patient presents with  . Allergic Reaction    Bianca Hayes is a 30 y.o. female.  Bianca Hayes is a 30 y.o. female with a history of anemia and ovarian cyst, who presents via EMS with concern for allergic reaction.  Patient reports that her boss had made some sort of dessert that had pineapple and coconut in it, she has had pineapple numerous times but does not know if she is ever had coconut.  She reports that 5 to 10 minutes after eating it she started to feel like her throat was getting scratchy and swelling up.  She has not noted any facial swelling, rash, nausea or vomiting.  Did feel a bit lightheaded but has not passed out.  EMS was called and she was given 50 mg of Benadryl prior to transport.  She reports she feels a bit better, but states that her throat still feels a bit funny.  No prior history of anaphylaxis.  No new medications household products or any other new known foods.  No other aggravating or alleviating factors.        Past Medical History:  Diagnosis Date  . Anemia   . Ovarian cyst     There are no problems to display for this patient.   Past Surgical History:  Procedure Laterality Date  . DENTAL SURGERY       OB History   No obstetric history on file.     No family history on file.  Social History   Tobacco Use  . Smoking status: Never Smoker  . Smokeless tobacco: Never Used  Substance Use Topics  . Alcohol use: No  . Drug use: No    Home Medications Prior to Admission medications   Medication Sig Start Date End Date Taking? Authorizing Provider  acetaminophen (TYLENOL) 325 MG tablet Take 2 tablets (650 mg total) by mouth every 6 (six) hours as needed. 02/18/20  Yes Sharman Cheek, MD  etonogestrel (NEXPLANON) 68 MG IMPL implant 65 mg by Subdermal route once. 03/21/16  Yes [provider]   ibuprofen (ADVIL) 200 MG tablet Take 400 mg by mouth every 6 (six) hours as needed.   Yes [provider]  predniSONE (DELTASONE) 20 MG tablet Take 2 tablets (40 mg total) by mouth daily for 5 days. 01/04/21 01/09/21 Yes Dartha Lodge, PA-C  medroxyPROGESTERone (PROVERA) 10 MG tablet Take 2 tablets (20 mg total) by mouth daily. Patient not taking: No sig reported 02/18/20   Sharman Cheek, MD  naproxen (NAPROSYN) 500 MG tablet Take 1 tablet (500 mg total) by mouth 2 (two) times daily with a meal. Patient not taking: No sig reported 02/18/20   Sharman Cheek, MD  ondansetron (ZOFRAN ODT) 4 MG disintegrating tablet Take 1 tablet (4 mg total) by mouth every 8 (eight) hours as needed for nausea or vomiting. Patient not taking: No sig reported 02/29/20   Chesley Noon, MD  pantoprazole (PROTONIX) 40 MG tablet Take 1 tablet (40 mg total) by mouth daily. 03/29/18 03/29/19  Emily Filbert, MD  UNKNOWN TO PATIENT Take 1 tablet by mouth daily. Patient not taking: Reported on 01/04/2021    [provider]    Allergies    Coconut flavor, Iodine, Bactrim [sulfamethoxazole-trimethoprim], and Penicillins  Review of Systems   Review of Systems  Constitutional: Negative for chills and fever.  HENT: Positive for sore throat. Negative for facial swelling and trouble swallowing.   Respiratory: Negative for shortness of breath and wheezing.   Cardiovascular: Negative for chest pain.  Gastrointestinal: Negative for abdominal pain, diarrhea, nausea and vomiting.  Musculoskeletal: Negative for myalgias.  Skin: Negative for rash.  Neurological: Positive for light-headedness. Negative for syncope and headaches.  All other systems reviewed and are negative.   Physical Exam Updated Vital Signs BP 124/76 (BP Location: Right Arm)   Pulse 78   Temp 98.9 F (37.2 C) (Oral)   Resp 18   Ht 5\' 9"  (1.753 m)   Wt (!) 158.8 kg   SpO2 98%   BMI 51.69 kg/m   Physical Exam Vitals and nursing  note reviewed.  Constitutional:      General: She is not in acute distress.    Appearance: Normal appearance. She is well-developed. She is obese. She is not diaphoretic.     Comments: Patient is alert, appears anxious, but is well-appearing and in no acute distress.  HENT:     Head: Normocephalic and atraumatic.     Nose: Nose normal.     Mouth/Throat:     Comments: No edema of the lips or tongue, posterior oropharynx is clear with no swelling, tolerating secretions, normal phonation. Eyes:     General:        Right eye: No discharge.        Left eye: No discharge.  Neck:     Comments: No stridor Cardiovascular:     Rate and Rhythm: Normal rate and regular rhythm.     Heart sounds: Normal heart sounds.  Pulmonary:     Effort: Pulmonary effort is normal. No respiratory distress.     Breath sounds: Normal breath sounds.     Comments: Respirations equal and unlabored, patient able to speak in full sentences, lungs clear to auscultation bilaterally Abdominal:     General: Bowel sounds are normal. There is no distension.     Palpations: Abdomen is soft.     Tenderness: There is no abdominal tenderness.  Musculoskeletal:        General: No deformity.     Cervical back: Neck supple.  Skin:    General: Skin is warm and dry.     Findings: No rash.     Comments: No urticaria or rash noted.  Neurological:     Mental Status: She is alert and oriented to person, place, and time.     Coordination: Coordination normal.  Psychiatric:        Mood and Affect: Mood is anxious.        Behavior: Behavior normal.     ED Results / Procedures / Treatments   Labs (all labs ordered are listed, but only abnormal results are displayed) Labs Reviewed - No data to display  EKG None  Radiology No results found.  Procedures Procedures   Medications Ordered in ED Medications  methylPREDNISolone sodium succinate (SOLU-MEDROL) 125 mg/2 mL injection 125 mg (125 mg Intravenous Given 01/04/21  1606)  famotidine (PEPCID) IVPB 20 mg premix (0 mg Intravenous Stopped 01/04/21 1833)    ED Course  I have reviewed the triage vital signs and the nursing notes.  Pertinent labs & imaging results that were available during my care of the patient were reviewed by me and considered in my medical decision making (see chart for details).    MDM Rules/Calculators/A&P  30 year old female presents with concern for allergic reaction after eating a dessert that contained pineapple and coconut, has had pineapple before with no reaction but does not think she has had coconut.  Reports feeling scratchy throat and throat swelling sensation.  No other symptoms associated, no rash, no swelling of the lips or tongue.  No vomiting, no syncope.  When EMS arrived they gave 50 of Benadryl and patient reports very slight improvement in symptoms but still feels like her throat is not normal.  There is no swelling noted on exam, no stridor and lungs are clear.  Will treat with prednisone, and Pepcid and observe the patient, but do not feel she needs epinephrine at this time.  Patient observed in the ED for 3 hours with no worsening in symptoms, reports improvement in scratchy feeling in throat, she has been able to tolerate p.o. fluids here in the ED.  At this time I feel she is stable for discharge home, will prescribe 5 more days of steroids and encouraged her to use Benadryl and Pepcid as well.  Discussed return precautions.  Patient expresses understanding and agreement with plan.  Discharged home in good condition.  Final Clinical Impression(s) / ED Diagnoses Final diagnoses:  Allergic reaction, initial encounter    Rx / DC Orders ED Discharge Orders         Ordered    predniSONE (DELTASONE) 20 MG tablet  Daily        01/04/21 1919           Legrand Rams 01/06/21 2136    Bethann Berkshire, MD 01/08/21 1131

## 2021-01-04 NOTE — Discharge Instructions (Signed)
Do not consume anything with coconut in it.  Take prescribed steroids as well as Pepcid 10 mg twice daily and Benadryl 25 mg every 6 hours as needed to help ensure that reaction is fully resolved.  If you develop facial swelling, rash, vomiting or difficulty breathing return for reevaluation.

## 2021-01-04 NOTE — ED Triage Notes (Signed)
Pt presents to ED via [facility] after allergic reaction to coconut and pineapple. Pt states her boss brought a homemade dessert and after about 5-10 minutes she started feeling her throat swelling up. EMS given Benadryl 50 mg PTA. O2 sats with EMS 100% on RA. Pt in NAD at this time.

## 2021-05-29 ENCOUNTER — Other Ambulatory Visit: Payer: Self-pay

## 2021-05-29 ENCOUNTER — Ambulatory Visit
Admission: EM | Admit: 2021-05-29 | Discharge: 2021-05-29 | Disposition: A | Discharge status: Home / Self Care | Payer: Medicaid Other | Attending: Family Medicine | Admitting: Family Medicine

## 2021-05-29 ENCOUNTER — Encounter: Payer: Self-pay | Admitting: Emergency Medicine

## 2021-05-29 DIAGNOSIS — B349 Viral infection, unspecified: Secondary | ICD-10-CM

## 2021-05-29 MED ORDER — DIPHENOXYLATE-ATROPINE 2.5-0.025 MG PO TABS: 1.0000 | ORAL_TABLET | Freq: Four times a day (QID) | ORAL | 0 refills | Status: DC | PRN

## 2021-05-29 MED ORDER — ONDANSETRON 4 MG PO TBDP: 4.0000 mg | ORAL_TABLET | Freq: Three times a day (TID) | ORAL | 0 refills | Status: DC | PRN

## 2021-05-29 MED ORDER — PROMETHAZINE-DM 6.25-15 MG/5ML PO SYRP: 5.0000 mL | ORAL_SOLUTION | Freq: Four times a day (QID) | ORAL | 0 refills | Status: DC | PRN

## 2021-05-29 NOTE — Discharge Instructions (Addendum)
Repeat home COVID test if fever develops or if your symptoms worsen and do not readily improve within the next 1 to 2 days.

## 2021-05-29 NOTE — ED Provider Notes (Addendum)
[facility]    CSN: [id number] Arrival date & time: 05/29/21  0804      History   Chief Complaint Chief Complaint  Patient presents with   Emesis    HPI Bianca Hayes is a 30 y.o. female.   HPI Patient presents today for evaluation of symptoms of diarrhea, nausea with vomiting and a subsequent productive cough.  Patient is afebrile on arrival. Denies any abdominal pain.  She is able to eat food however reports an hour after eating she subsequently developed vomiting.  She has a history of acid reflux and takes omeprazole.  She reports last episode of diarrhea was this morning after eating a pork chop biscuit. Past Medical History:  Diagnosis Date   Anemia    Ovarian cyst     There are no problems to display for this patient.   Past Surgical History:  Procedure Laterality Date   DENTAL SURGERY      OB History   No obstetric history on file.      Home Medications    Prior to Admission medications   Medication Sig Start Date End Date Taking? Authorizing Provider  diphenoxylate-atropine (LOMOTIL) 2.5-0.025 MG tablet Take 1-2 tablets by mouth 4 (four) times daily as needed for diarrhea or loose stools. 05/29/21  Yes Bing Neighbors, FNP  ondansetron (ZOFRAN ODT) 4 MG disintegrating tablet Take 1 tablet (4 mg total) by mouth every 8 (eight) hours as needed for nausea or vomiting. 05/29/21  Yes Bing Neighbors, FNP  promethazine-dextromethorphan (PROMETHAZINE-DM) 6.25-15 MG/5ML syrup Take 5 mLs by mouth 4 (four) times daily as needed for cough. 05/29/21  Yes Bing Neighbors, FNP  acetaminophen (TYLENOL) 325 MG tablet Take 2 tablets (650 mg total) by mouth every 6 (six) hours as needed. 02/18/20   Sharman Cheek, MD  etonogestrel (NEXPLANON) 68 MG IMPL implant 65 mg by Subdermal route once. 03/21/16   [provider]  ibuprofen (ADVIL) 200 MG tablet Take 400 mg by mouth every 6 (six) hours as needed.    [provider]   medroxyPROGESTERone (PROVERA) 10 MG tablet Take 2 tablets (20 mg total) by mouth daily. Patient not taking: No sig reported 02/18/20   Sharman Cheek, MD  naproxen (NAPROSYN) 500 MG tablet Take 1 tablet (500 mg total) by mouth 2 (two) times daily with a meal. Patient not taking: No sig reported 02/18/20   Sharman Cheek, MD  pantoprazole (PROTONIX) 40 MG tablet Take 1 tablet (40 mg total) by mouth daily. 03/29/18 03/29/19  Emily Filbert, MD  UNKNOWN TO PATIENT Take 1 tablet by mouth daily. Patient not taking: Reported on 01/04/2021    [provider]    Family History No family history on file.  Social History Social History   Tobacco Use   Smoking status: Never   Smokeless tobacco: Never  Substance Use Topics   Alcohol use: No   Drug use: No     Allergies   Coconut flavor, Iodine, Bactrim [sulfamethoxazole-trimethoprim], and Penicillins   Review of Systems Review of Systems Pertinent negatives listed in HPI  Physical Exam Triage Vital Signs ED Triage Vitals [05/29/21 0816]  Enc Vitals Group     BP 125/83     Pulse Rate 83     Resp 16     Temp 97.9 F (36.6 C)     Temp Source Tympanic     SpO2 98 %     Weight      Height  Head Circumference      Peak Flow      Pain Score      Pain Loc      Pain Edu?      Excl. in GC?    No data found.  Updated Vital Signs BP 125/83 (BP Location: Right Arm)   Pulse 83   Temp 97.9 F (36.6 C) (Tympanic)   Resp 16   SpO2 98%   Visual Acuity Right Eye Distance:   Left Eye Distance:   Bilateral Distance:    Right Eye Near:   Left Eye Near:    Bilateral Near:     Physical Exam Constitutional: Patient appears obese, acutely ill-appearing, cooperative, no distress s. HENT: Normocephalic, atraumatic, External right and left ear normal. Oropharynx is clear and moist.  Eyes: Conjunctivae and EOM are normal. PERRLA, no scleral icterus. Neck: Normal ROM. Neck supple. No JVD. No tracheal deviation. No  thyromegaly. CVS: RRR, S1/S2 +, no murmurs, no gallops, no carotid bruit.  Pulmonary: Effort and breath sounds normal, no stridor, rhonchi, wheezes, rales.  Abdominal: Soft.  Hyperactive BS, negative tenderness, negative rebound or guarding.  Musculoskeletal: Normal range of motion. No edema and no tenderness.  Neuro: Alert. Normal reflexes, muscle tone coordination. No cranial nerve deficit. Skin: Skin is warm and dry. No rash noted. Not diaphoretic. No erythema. No pallor. Psychiatric: Normal mood and affect. Behavior, judgment, thought content normal.  UC Treatments / Results  Labs (all labs ordered are listed, but only abnormal results are displayed) Labs Reviewed - No data to display  EKG   Radiology No results found.  Procedures Procedures (including critical care time)  Medications Ordered in UC Medications - No data to display  Initial Impression / Assessment and Plan / UC Course  I have reviewed the triage vital signs and the nursing notes.  Pertinent labs & imaging results that were available during my care of the patient were reviewed by me and considered in my medical decision making (see chart for details).     Viral illness patient declined repeat PCR COVID testing.  Symptomatic management warranted only given symptoms are viral in etiology.  Treatment per discharge instructions.  Precautions given indications for retesting include fever or unresolving of symptoms.  If abdominal pain develops go immediately to the ER. Work note provided.  Patient verbalized understanding agreement plan Final Clinical Impressions(s) / UC Diagnoses   Final diagnoses:  Viral illness     Discharge Instructions      Repeat home COVID test if fever develops or if your symptoms worsen and do not readily improve within the next 1 to 2 days.       ED Prescriptions     Medication Sig Dispense Auth. Provider   ondansetron (ZOFRAN ODT) 4 MG disintegrating tablet Take 1 tablet  (4 mg total) by mouth every 8 (eight) hours as needed for nausea or vomiting. 20 tablet Bing Neighbors, FNP   diphenoxylate-atropine (LOMOTIL) 2.5-0.025 MG tablet Take 1-2 tablets by mouth 4 (four) times daily as needed for diarrhea or loose stools. 30 tablet Bing Neighbors, FNP   promethazine-dextromethorphan (PROMETHAZINE-DM) 6.25-15 MG/5ML syrup Take 5 mLs by mouth 4 (four) times daily as needed for cough. 120 mL Bing Neighbors, FNP      PDMP not reviewed this encounter.   Bing Neighbors, FNP 05/29/21 0837    Bing Neighbors, FNP 05/29/21 [phone]

## 2021-05-29 NOTE — ED Triage Notes (Signed)
Cough. N/v/d.  S/s started on Friday.  Pt told by workplace to come get checked out.  Neg at home covid done today.

## 2021-07-02 ENCOUNTER — Other Ambulatory Visit: Payer: Self-pay

## 2021-07-02 ENCOUNTER — Encounter (HOSPITAL_COMMUNITY): Payer: Self-pay

## 2021-07-02 ENCOUNTER — Emergency Department (HOSPITAL_COMMUNITY)
Admission: EM | Admit: 2021-07-02 | Discharge: 2021-07-02 | Disposition: A | Discharge status: Home / Self Care | Payer: Medicaid Other | Attending: Emergency Medicine | Admitting: Emergency Medicine

## 2021-07-02 DIAGNOSIS — B349 Viral infection, unspecified: Secondary | ICD-10-CM | POA: Diagnosis not present

## 2021-07-02 DIAGNOSIS — Z20822 Contact with and (suspected) exposure to covid-19: Secondary | ICD-10-CM | POA: Insufficient documentation

## 2021-07-02 DIAGNOSIS — Z8616 Personal history of COVID-19: Secondary | ICD-10-CM | POA: Insufficient documentation

## 2021-07-02 DIAGNOSIS — N939 Abnormal uterine and vaginal bleeding, unspecified: Secondary | ICD-10-CM | POA: Insufficient documentation

## 2021-07-02 LAB — URINALYSIS, ROUTINE W REFLEX MICROSCOPIC
Bilirubin Urine: NEGATIVE
Glucose, UA: NEGATIVE mg/dL
Ketones, ur: NEGATIVE mg/dL
Leukocytes,Ua: NEGATIVE
Nitrite: NEGATIVE
Protein, ur: NEGATIVE mg/dL
Specific Gravity, Urine: >1.03 — ABNORMAL HIGH (ref 1.005–1.030)
pH: 5.5 (ref 5.0–8.0)

## 2021-07-02 LAB — URINALYSIS, MICROSCOPIC (REFLEX): Bacteria, UA: NONE SEEN

## 2021-07-02 LAB — PREGNANCY, URINE: Preg Test, Ur: NEGATIVE

## 2021-07-02 LAB — RESP PANEL BY RT-PCR (FLU A&B, COVID) ARPGX2
Influenza A by PCR: NEGATIVE
Influenza B by PCR: NEGATIVE
SARS Coronavirus 2 by RT PCR: NEGATIVE

## 2021-07-02 NOTE — ED Triage Notes (Signed)
Pt. Would like to be tested for covid and get a pregnancy test. Pt. States they have had a headache, body ache and congestion for two days. Pt. States they have around someone with covid.

## 2021-07-02 NOTE — ED Provider Notes (Signed)
[facility] EMERGENCY DEPARTMENT Provider Note   CSN: [id number] Arrival date & time: 07/02/21  1240     History Chief Complaint  Patient presents with   Generalized Body Aches    Bianca Hayes is a 30 y.o. female.  HPI      Bianca Hayes is a 30 y.o. female who presents to the Emergency Department complaining of frontal headache, generalized body aches, cough and congestion and intermittent nausea, vomiting and diarrhea. States that she began feeling poorly nearly 1 week ago, but symptoms worsened 2 to 3 days ago.  Reports throbbing headache from her temples and across her forehead.  Subjective low-grade fever at home.  Cough occasionally productive of sputum.  Patient states that she is taken 3 home COVID test that have all been negative.  She has been exposed to someone with COVID recently.  She is vaccinated x2, but no booster.  She denies any chest pain, shortness of breath and abdominal pain.  She is requesting a pregnancy test as she has noticed that she has had irregular vaginal bleeding for 2 weeks.  Early menses today.  No vaginal discharge or pelvic pain. She does have Nexplanon implant, but states the implant is overdue for removal. She denies abdominal pain and dysuria.  No fever reducers today.    Past Medical History:  Diagnosis Date   Anemia    Ovarian cyst     There are no problems to display for this patient.   Past Surgical History:  Procedure Laterality Date   DENTAL SURGERY       OB History   No obstetric history on file.     No family history on file.  Social History   Tobacco Use   Smoking status: Never   Smokeless tobacco: Never  Substance Use Topics   Alcohol use: No   Drug use: No    Home Medications Prior to Admission medications   Medication Sig Start Date End Date Taking? Authorizing Provider  acetaminophen (TYLENOL) 325 MG tablet Take 2 tablets (650 mg total) by mouth every 6 (six) hours as needed. 02/18/20   Sharman Cheek, MD  diphenoxylate-atropine (LOMOTIL) 2.5-0.025 MG tablet Take 1-2 tablets by mouth 4 (four) times daily as needed for diarrhea or loose stools. 05/29/21   Bing Neighbors, FNP  etonogestrel (NEXPLANON) 68 MG IMPL implant 65 mg by Subdermal route once. 03/21/16   [provider]  ibuprofen (ADVIL) 200 MG tablet Take 400 mg by mouth every 6 (six) hours as needed.    [provider]  medroxyPROGESTERone (PROVERA) 10 MG tablet Take 2 tablets (20 mg total) by mouth daily. Patient not taking: No sig reported 02/18/20   Sharman Cheek, MD  naproxen (NAPROSYN) 500 MG tablet Take 1 tablet (500 mg total) by mouth 2 (two) times daily with a meal. Patient not taking: No sig reported 02/18/20   Sharman Cheek, MD  ondansetron (ZOFRAN ODT) 4 MG disintegrating tablet Take 1 tablet (4 mg total) by mouth every 8 (eight) hours as needed for nausea or vomiting. 05/29/21   Bing Neighbors, FNP  pantoprazole (PROTONIX) 40 MG tablet Take 1 tablet (40 mg total) by mouth daily. 03/29/18 03/29/19  Emily Filbert, MD  promethazine-dextromethorphan (PROMETHAZINE-DM) 6.25-15 MG/5ML syrup Take 5 mLs by mouth 4 (four) times daily as needed for cough. 05/29/21   Bing Neighbors, FNP  UNKNOWN TO PATIENT Take 1 tablet by mouth daily. Patient not taking: Reported on 01/04/2021  [provider]    Allergies    Coconut flavor, Iodine, Bactrim [sulfamethoxazole-trimethoprim], and Penicillins  Review of Systems   Review of Systems  Constitutional:  Positive for fever. Negative for chills and fatigue.  HENT:  Positive for congestion. Negative for sore throat and trouble swallowing.   Respiratory:  Positive for cough. Negative for shortness of breath and wheezing.   Cardiovascular:  Negative for chest pain and palpitations.  Gastrointestinal:  Positive for diarrhea, nausea and vomiting. Negative for abdominal pain and blood in stool.  Genitourinary:  Positive for menstrual problem.  Negative for dysuria and flank pain.  Musculoskeletal:  Positive for myalgias (Generalized body aches). Negative for arthralgias, back pain, neck pain and neck stiffness.  Skin:  Negative for rash.  Neurological:  Positive for headaches. Negative for dizziness, syncope, weakness and numbness.  Hematological:  Does not bruise/bleed easily.  Psychiatric/Behavioral:  Negative for confusion.    Physical Exam Updated Vital Signs BP 135/89 (BP Location: Left Arm)   Pulse 95   Temp 98.3 F (36.8 C) (Oral)   Resp 19   Ht 5\' 9"  (1.753 m)   Wt (!) 149.7 kg   LMP 07/02/2021 (Exact Date)   SpO2 98%   BMI 48.73 kg/m   Physical Exam Vitals and nursing note reviewed.  Constitutional:      Appearance: Normal appearance. She is not ill-appearing or toxic-appearing.  Cardiovascular:     Rate and Rhythm: Normal rate and regular rhythm.     Pulses: Normal pulses.  Pulmonary:     Effort: Pulmonary effort is normal. No respiratory distress.     Breath sounds: Normal breath sounds. No wheezing.  Abdominal:     Palpations: Abdomen is soft.     Tenderness: There is no abdominal tenderness.  Musculoskeletal:     Right lower leg: No edema.     Left lower leg: No edema.  Lymphadenopathy:     Cervical: No cervical adenopathy.  Skin:    General: Skin is warm.     Capillary Refill: Capillary refill takes less than 2 seconds.     Findings: No rash.  Neurological:     General: No focal deficit present.     Mental Status: She is alert.     Sensory: No sensory deficit.     Motor: No weakness.    ED Results / Procedures / Treatments   Labs (all labs ordered are listed, but only abnormal results are displayed) Labs Reviewed  URINALYSIS, ROUTINE W REFLEX MICROSCOPIC - Abnormal; Notable for the following components:      Result Value   Specific Gravity, Urine >1.030 (*)    Hgb urine dipstick LARGE (*)    All other components within normal limits  RESP PANEL BY RT-PCR (FLU A&B, COVID) ARPGX2   PREGNANCY, URINE  URINALYSIS, MICROSCOPIC (REFLEX)    EKG None  Radiology No results found.  Procedures Procedures   Medications Ordered in ED Medications - No data to display  ED Course  I have reviewed the triage vital signs and the nursing notes.  Pertinent labs & imaging results that were available during my care of the patient were reviewed by me and considered in my medical decision making (see chart for details).    MDM Rules/Calculators/A&P                           Patient here with symptoms suggestive of COVID.  Endorses recent exposure.  Has been  vaccinated but x2 but no booster.  States that she has had COVID 3 times previously.  Last infection earlier this year.  On exam, patient well-appearing.  Nontoxic.  Afebrile here.  No fever reducers today.  No tachycardia, hypoxia or tachypnea.  PERC negative.  Low clinical suspicion for PE.  Lungs are clear.  No indication for chest x-ray at this time.  Will obtain pregnancy test and COVID testing.  Low clinical suspicion for acute abdominal process.  Urinalysis without evidence of infection.   + Hematuria likely secondary to menses.  Pregnancy test negative.  COVID and influenza testing also negative.  Doubt acute emergent process.  Likely viral illness.  Patient reassured.  All questions were answered.  She agrees to symptomatic over-the-counter treatment.  Return precautions were discussed.  Appears appropriate for discharge home.  Final Clinical Impression(s) / ED Diagnoses Final diagnoses:  Viral illness    Rx / DC Orders ED Discharge Orders     None        Pauline Aus, PA-C 07/02/21 1559    Benjiman Core, MD 07/03/21 [phone]

## 2021-07-02 NOTE — Discharge Instructions (Signed)
Your urine test and pregnancy test today were negative.  Your COVID and influenza testing were also negative.  You likely have a viral illness.  Antibiotics do not treat viral illnesses.  I recommend that you take over-the-counter cough medicine such as Robitussin-DM.  Alternate Tylenol and ibuprofen every 4-6 hours if needed for fever and/or body aches.  Return to the emergency department for any new or worsening symptoms.

## 2021-07-24 ENCOUNTER — Other Ambulatory Visit: Payer: Self-pay

## 2021-07-24 ENCOUNTER — Encounter (HOSPITAL_COMMUNITY): Payer: Self-pay | Admitting: Emergency Medicine

## 2021-07-24 ENCOUNTER — Emergency Department (HOSPITAL_COMMUNITY)
Admission: EM | Admit: 2021-07-24 | Discharge: 2021-07-24 | Disposition: A | Discharge status: Home / Self Care | Payer: Medicaid Other | Attending: Emergency Medicine | Admitting: Emergency Medicine

## 2021-07-24 DIAGNOSIS — R112 Nausea with vomiting, unspecified: Secondary | ICD-10-CM | POA: Insufficient documentation

## 2021-07-24 DIAGNOSIS — Z20822 Contact with and (suspected) exposure to covid-19: Secondary | ICD-10-CM | POA: Insufficient documentation

## 2021-07-24 DIAGNOSIS — R519 Headache, unspecified: Secondary | ICD-10-CM | POA: Insufficient documentation

## 2021-07-24 LAB — URINALYSIS, ROUTINE W REFLEX MICROSCOPIC
Bilirubin Urine: NEGATIVE
Glucose, UA: NEGATIVE mg/dL
Hgb urine dipstick: NEGATIVE
Ketones, ur: NEGATIVE mg/dL
Leukocytes,Ua: NEGATIVE
Nitrite: NEGATIVE
Protein, ur: NEGATIVE mg/dL
Specific Gravity, Urine: 1.027 (ref 1.005–1.030)
pH: 6 (ref 5.0–8.0)

## 2021-07-24 LAB — COMPREHENSIVE METABOLIC PANEL
ALT: 19 U/L (ref 0–44)
AST: 15 U/L (ref 15–41)
Albumin: 3.8 g/dL (ref 3.5–5.0)
Alkaline Phosphatase: 88 U/L (ref 38–126)
Anion gap: 5 (ref 5–15)
BUN: 19 mg/dL (ref 6–20)
CO2: 27 mmol/L (ref 22–32)
Calcium: 8.5 mg/dL — ABNORMAL LOW (ref 8.9–10.3)
Chloride: 108 mmol/L (ref 98–111)
Creatinine, Ser: 0.66 mg/dL (ref 0.44–1.00)
GFR, Estimated: >60 mL/min (ref >60)
Glucose, Bld: 104 mg/dL — ABNORMAL HIGH (ref 70–99)
Potassium: 3.9 mmol/L (ref 3.5–5.1)
Sodium: 140 mmol/L (ref 135–145)
Total Bilirubin: 0.8 mg/dL (ref 0.3–1.2)
Total Protein: 6.9 g/dL (ref 6.5–8.1)

## 2021-07-24 LAB — CBC
HCT: 39.3 % (ref 36.0–46.0)
Hemoglobin: 12.5 g/dL (ref 12.0–15.0)
MCH: 27.9 pg (ref 26.0–34.0)
MCHC: 31.8 g/dL (ref 30.0–36.0)
MCV: 87.7 fL (ref 80.0–100.0)
Platelets: 238 10*3/uL (ref 150–400)
RBC: 4.48 MIL/uL (ref 3.87–5.11)
RDW: 14.3 % (ref 11.5–15.5)
WBC: 5 10*3/uL (ref 4.0–10.5)
nRBC: 0 % (ref 0.0–0.2)

## 2021-07-24 LAB — RESP PANEL BY RT-PCR (FLU A&B, COVID) ARPGX2
Influenza A by PCR: NEGATIVE
Influenza B by PCR: NEGATIVE
SARS Coronavirus 2 by RT PCR: NEGATIVE

## 2021-07-24 LAB — POC URINE PREG, ED: Preg Test, Ur: NEGATIVE

## 2021-07-24 LAB — LIPASE, BLOOD: Lipase: 20 U/L (ref 11–51)

## 2021-07-24 MED ORDER — ONDANSETRON 8 MG PO TBDP
8.0000 mg | ORAL_TABLET | Freq: Once | ORAL | Status: AC
  Administered 2021-07-24: 8 mg via ORAL
  Filled 2021-07-24: qty 1

## 2021-07-24 MED ORDER — PROCHLORPERAZINE MALEATE 5 MG PO TABS
10.0000 mg | ORAL_TABLET | Freq: Once | ORAL | Status: AC
  Administered 2021-07-24: 10 mg via ORAL
  Filled 2021-07-24: qty 2

## 2021-07-24 MED ORDER — ONDANSETRON HCL 4 MG PO TABS: 4.0000 mg | ORAL_TABLET | Freq: Four times a day (QID) | ORAL | 0 refills | Status: DC

## 2021-07-24 NOTE — ED Notes (Signed)
Pt. Provided with water at bedside. 

## 2021-07-24 NOTE — ED Provider Notes (Signed)
[facility] EMERGENCY DEPARTMENT Provider Note   CSN: [id number] Arrival date & time: 07/24/21  4580     History Chief Complaint  Patient presents with   Emesis    Bianca Hayes is a 30 y.o. female.  HPI  Patient with no significant medical history presents to the emergency department with multiple complaints.  Patient states that she has not been feeling well since she was last seen here in the emergency department last month.  She states that she has intermittent headaches states that she feels it on the front of her head, these headaches will come and go, will have occasional change in vision but denies paresthesia or weakness upper lower extremities, currently she does not have a headache at this time no visual changes, she denies recent head trauma, is not on anticoagulant, denies neck pain, history of IV drug use.  She also notes that she has been having some intermittent nausea and vomiting, she denies hematemesis or coffee-ground emesis, denies constipation or diarrhea, denies  urinary symptoms, states that she always feels nauseated this is not a new thing for her.  No associated stomach pain.  Patient is unsure if she has a viral infection or not, she admits that her coworker was recent diagnosed with COVID, she is vaccinated against COVID-19, she not immunocompromise.  She has no other complaints this time.  No significant abdominal history, denies history of NSAIDs, PUD, alcohol use, diverticulitis, bowel obstruction.  Past Medical History:  Diagnosis Date   Anemia    Ovarian cyst     There are no problems to display for this patient.   Past Surgical History:  Procedure Laterality Date   DENTAL SURGERY       OB History   No obstetric history on file.     No family history on file.  Social History   Tobacco Use   Smoking status: Never   Smokeless tobacco: Never  Substance Use Topics   Alcohol use: No   Drug use: No    Home Medications Prior to  Admission medications   Medication Sig Start Date End Date Taking? Authorizing Provider  ondansetron (ZOFRAN) 4 MG tablet Take 1 tablet (4 mg total) by mouth every 6 (six) hours. 07/24/21  Yes Marcello Fennel, PA-C  acetaminophen (TYLENOL) 325 MG tablet Take 2 tablets (650 mg total) by mouth every 6 (six) hours as needed. 02/18/20   Carrie Mew, MD  diphenoxylate-atropine (LOMOTIL) 2.5-0.025 MG tablet Take 1-2 tablets by mouth 4 (four) times daily as needed for diarrhea or loose stools. 05/29/21   Scot Jun, FNP  etonogestrel (NEXPLANON) 68 MG IMPL implant 65 mg by Subdermal route once. 03/21/16   [provider]  ibuprofen (ADVIL) 200 MG tablet Take 400 mg by mouth every 6 (six) hours as needed.    [provider]  medroxyPROGESTERone (PROVERA) 10 MG tablet Take 2 tablets (20 mg total) by mouth daily. Patient not taking: No sig reported 02/18/20   Carrie Mew, MD  naproxen (NAPROSYN) 500 MG tablet Take 1 tablet (500 mg total) by mouth 2 (two) times daily with a meal. Patient not taking: No sig reported 02/18/20   Carrie Mew, MD  ondansetron (ZOFRAN ODT) 4 MG disintegrating tablet Take 1 tablet (4 mg total) by mouth every 8 (eight) hours as needed for nausea or vomiting. 05/29/21   Scot Jun, FNP  pantoprazole (PROTONIX) 40 MG tablet Take 1 tablet (40 mg total) by mouth daily. 03/29/18 03/29/19  Earleen Newport, MD  promethazine-dextromethorphan (PROMETHAZINE-DM) 6.25-15 MG/5ML syrup Take 5 mLs by mouth 4 (four) times daily as needed for cough. 05/29/21   Scot Jun, FNP  UNKNOWN TO PATIENT Take 1 tablet by mouth daily. Patient not taking: Reported on 01/04/2021    [provider]    Allergies    Coconut flavor, Iodine, Bactrim [sulfamethoxazole-trimethoprim], and Penicillins  Review of Systems   Review of Systems  Constitutional:  Negative for chills and fever.  HENT:  Negative for congestion.   Eyes:  Negative for visual  disturbance.  Respiratory:  Negative for shortness of breath.   Cardiovascular:  Negative for chest pain.  Gastrointestinal:  Positive for nausea and vomiting. Negative for abdominal pain, constipation and diarrhea.  Genitourinary:  Negative for dysuria and enuresis.  Musculoskeletal:  Negative for back pain and myalgias.  Skin:  Negative for rash.  Neurological:  Positive for headaches. Negative for dizziness and weakness.  Hematological:  Does not bruise/bleed easily.   Physical Exam Updated Vital Signs BP 132/83 (BP Location: Left Arm)   Pulse 80   Temp 98 F (36.7 C) (Oral)   Resp 18   Ht 5' 9"  (1.753 m)   Wt (!) 149.7 kg   LMP 07/02/2021 (Exact Date)   SpO2 100%   BMI 48.73 kg/m   Physical Exam Vitals and nursing note reviewed.  Constitutional:      General: She is not in acute distress.    Appearance: She is not ill-appearing.  HENT:     Head: Normocephalic and atraumatic.     Nose: No congestion.     Mouth/Throat:     Mouth: Mucous membranes are moist.     Pharynx: Oropharynx is clear. No oropharyngeal exudate or posterior oropharyngeal erythema.  Eyes:     Conjunctiva/sclera: Conjunctivae normal.  Cardiovascular:     Rate and Rhythm: Normal rate and regular rhythm.     Pulses: Normal pulses.     Heart sounds: No murmur heard.   No friction rub. No gallop.  Pulmonary:     Effort: No respiratory distress.     Breath sounds: No wheezing, rhonchi or rales.  Abdominal:     Palpations: Abdomen is soft.     Tenderness: There is no abdominal tenderness. There is no right CVA tenderness or left CVA tenderness.     Comments: Abdomen nondistended dull to percussion, normoactive bowel sounds, patient, no guarding, rebound times, peritoneal sign.  Skin:    General: Skin is warm and dry.  Neurological:     Mental Status: She is alert.     GCS: GCS eye subscore is 4. GCS verbal subscore is 5. GCS motor subscore is 6.     Cranial Nerves: No cranial nerve deficit.      Sensory: Sensation is intact.     Motor: No weakness.     Coordination: Romberg sign negative. Finger-Nose-Finger Test normal.     Comments: Cranial nerves II through XII are grossly intact, no facial asymmetry, no difficult word finding, no slurring of words, able to follow two-step commands, no unilateral weakness present.  Psychiatric:        Mood and Affect: Mood normal.    ED Results / Procedures / Treatments   Labs (all labs ordered are listed, but only abnormal results are displayed) Labs Reviewed  COMPREHENSIVE METABOLIC PANEL - Abnormal; Notable for the following components:      Result Value   Glucose, Bld 104 (*)    Calcium  8.5 (*)    All other components within normal limits  URINALYSIS, ROUTINE W REFLEX MICROSCOPIC - Abnormal; Notable for the following components:   APPearance HAZY (*)    All other components within normal limits  RESP PANEL BY RT-PCR (FLU A&B, COVID) ARPGX2  LIPASE, BLOOD  CBC  POC URINE PREG, ED    EKG None  Radiology No results found.  Procedures Procedures   Medications Ordered in ED Medications  ondansetron (ZOFRAN-ODT) disintegrating tablet 8 mg (8 mg Oral Given 07/24/21 1154)  prochlorperazine (COMPAZINE) tablet 10 mg (10 mg Oral Given 07/24/21 1154)    ED Course  I have reviewed the triage vital signs and the nursing notes.  Pertinent labs & imaging results that were available during my care of the patient were reviewed by me and considered in my medical decision making (see chart for details).    MDM Rules/Calculators/A&P                          Initial impression-patient presents with multiple complaints.  She is alert, does not appear acute stress, vital signs reassuring.  Triage obtain basic lab work-up, will COVID and reassess.  Work-up-CBC unremarkable, CMP shows hyperglycemia 104, calcium 8.5, lipase 20, respiratory panel negative, UA unremarkable.  Reassessment-patient is reassessed she has no complaints at this time,  states tolerating p.o., vital signs remained stable.  Patient agreeable for discharge.  Rule out-low suspicion for systemic infection as patient is nontoxic-appearing, vital signs reassuring.  Low suspicion for intracranial head bleed and or CVA as she has no focal deficit present my exam, she denies headaches, change in vision, paresthesia or weakness of the lower extremities, not on anticoagulant.  Low suspicion for liver or gallbladder normality as she has no right upper quadrant tenderness, liver enzymes, alk phos, T bili all within normal limits.  Low suspicion for pancreatitis as lipase within normal limits.  Low suspicion for bowel obstruction as abdomen is nondistended patient still passing gas and having bowel movements.  Low suspicion for complicated diverticulitis she is nontoxic-appearing, vital signs reassuring no leukocytosis.  Low suspicion for pneumonia as lung sounds are clear bilaterally, imaging will be deferred at this time.  Plan-  Headaches, nausea vomiting-unclear etiology possible viral URI, will recommend she continue all home medications follow-up with PCP as needed.  Vital signs have remained stable, no indication for hospital admission.   Patient given at home care as well strict return precautions.  Patient verbalized that they understood agreed to said plan.  Final Clinical Impression(s) / ED Diagnoses Final diagnoses:  Non-intractable vomiting with nausea, unspecified vomiting type  Bad headache    Rx / DC Orders ED Discharge Orders          Ordered    ondansetron (ZOFRAN) 4 MG tablet  Every 6 hours        07/24/21 1347             Marcello Fennel, PA-C 07/24/21 1348    Sherwood Gambler, MD 07/28/21 [phone]

## 2021-07-24 NOTE — ED Triage Notes (Signed)
Pt reports N/V, headache, and abdominal pain that started last night. Denies fevers.

## 2021-07-24 NOTE — Discharge Instructions (Signed)
Lab work and imaging are reassuring.  Please continue with all home medication as prescribed.  Given you prescription for Zofran please use as needed for nausea.  Please follow-up with PCP as needed.  Come back to the emergency department if you develop chest pain, shortness of breath, severe abdominal pain, uncontrolled nausea, vomiting, diarrhea.

## 2021-07-24 NOTE — ED Notes (Signed)
Pt. Has tolerated fluids.

## 2021-09-01 ENCOUNTER — Emergency Department (HOSPITAL_COMMUNITY): Payer: Self-pay

## 2021-09-01 ENCOUNTER — Emergency Department (HOSPITAL_COMMUNITY)
Admission: EM | Admit: 2021-09-01 | Discharge: 2021-09-01 | Disposition: A | Discharge status: Home / Self Care | Payer: Self-pay | Attending: Emergency Medicine | Admitting: Emergency Medicine

## 2021-09-01 ENCOUNTER — Other Ambulatory Visit: Payer: Self-pay

## 2021-09-01 ENCOUNTER — Telehealth: Payer: Medicaid Other | Admitting: Physician Assistant

## 2021-09-01 ENCOUNTER — Encounter (HOSPITAL_COMMUNITY): Payer: Self-pay

## 2021-09-01 DIAGNOSIS — R1030 Lower abdominal pain, unspecified: Secondary | ICD-10-CM

## 2021-09-01 DIAGNOSIS — N83291 Other ovarian cyst, right side: Secondary | ICD-10-CM | POA: Insufficient documentation

## 2021-09-01 DIAGNOSIS — N83201 Unspecified ovarian cyst, right side: Secondary | ICD-10-CM

## 2021-09-01 DIAGNOSIS — R102 Pelvic and perineal pain: Secondary | ICD-10-CM

## 2021-09-01 DIAGNOSIS — R509 Fever, unspecified: Secondary | ICD-10-CM

## 2021-09-01 DIAGNOSIS — Z20822 Contact with and (suspected) exposure to covid-19: Secondary | ICD-10-CM | POA: Insufficient documentation

## 2021-09-01 LAB — CBC WITH DIFFERENTIAL/PLATELET
Abs Immature Granulocytes: 0.02 10*3/uL (ref 0.00–0.07)
Basophils Absolute: 0 10*3/uL (ref 0.0–0.1)
Basophils Relative: 1 %
Eosinophils Absolute: 0.1 10*3/uL (ref 0.0–0.5)
Eosinophils Relative: 1 %
HCT: 36.1 % (ref 36.0–46.0)
Hemoglobin: 11.8 g/dL — ABNORMAL LOW (ref 12.0–15.0)
Immature Granulocytes: 0 %
Lymphocytes Relative: 24 %
Lymphs Abs: 1.3 10*3/uL (ref 0.7–4.0)
MCH: 29.1 pg (ref 26.0–34.0)
MCHC: 32.7 g/dL (ref 30.0–36.0)
MCV: 88.9 fL (ref 80.0–100.0)
Monocytes Absolute: 0.5 10*3/uL (ref 0.1–1.0)
Monocytes Relative: 9 %
Neutro Abs: 3.4 10*3/uL (ref 1.7–7.7)
Neutrophils Relative %: 65 %
Platelets: 215 10*3/uL (ref 150–400)
RBC: 4.06 MIL/uL (ref 3.87–5.11)
RDW: 14.3 % (ref 11.5–15.5)
WBC: 5.2 10*3/uL (ref 4.0–10.5)
nRBC: 0 % (ref 0.0–0.2)

## 2021-09-01 LAB — URINALYSIS, ROUTINE W REFLEX MICROSCOPIC
Bilirubin Urine: NEGATIVE
Glucose, UA: NEGATIVE mg/dL
Hgb urine dipstick: NEGATIVE
Ketones, ur: NEGATIVE mg/dL
Leukocytes,Ua: NEGATIVE
Nitrite: NEGATIVE
Protein, ur: NEGATIVE mg/dL
Specific Gravity, Urine: 1.027 (ref 1.005–1.030)
pH: 7 (ref 5.0–8.0)

## 2021-09-01 LAB — RESP PANEL BY RT-PCR (FLU A&B, COVID) ARPGX2
Influenza A by PCR: NEGATIVE
Influenza B by PCR: NEGATIVE
SARS Coronavirus 2 by RT PCR: NEGATIVE

## 2021-09-01 LAB — COMPREHENSIVE METABOLIC PANEL
ALT: 19 U/L (ref 0–44)
AST: 16 U/L (ref 15–41)
Albumin: 3.6 g/dL (ref 3.5–5.0)
Alkaline Phosphatase: 84 U/L (ref 38–126)
Anion gap: 8 (ref 5–15)
BUN: 14 mg/dL (ref 6–20)
CO2: 26 mmol/L (ref 22–32)
Calcium: 8.3 mg/dL — ABNORMAL LOW (ref 8.9–10.3)
Chloride: 105 mmol/L (ref 98–111)
Creatinine, Ser: 0.65 mg/dL (ref 0.44–1.00)
GFR, Estimated: >60 mL/min (ref >60)
Glucose, Bld: 96 mg/dL (ref 70–99)
Potassium: 3.8 mmol/L (ref 3.5–5.1)
Sodium: 139 mmol/L (ref 135–145)
Total Bilirubin: 0.8 mg/dL (ref 0.3–1.2)
Total Protein: 6.5 g/dL (ref 6.5–8.1)

## 2021-09-01 LAB — LIPASE, BLOOD: Lipase: 22 U/L (ref 11–51)

## 2021-09-01 LAB — PREGNANCY, URINE: Preg Test, Ur: NEGATIVE

## 2021-09-01 MED ORDER — KETOROLAC TROMETHAMINE 30 MG/ML IJ SOLN
30.0000 mg | Freq: Once | INTRAMUSCULAR | Status: AC
  Administered 2021-09-01: 30 mg via INTRAVENOUS
  Filled 2021-09-01: qty 1

## 2021-09-01 MED ORDER — MELOXICAM 7.5 MG PO TABS: 7.5000 mg | ORAL_TABLET | Freq: Two times a day (BID) | ORAL | 0 refills | Status: AC

## 2021-09-01 MED ORDER — SODIUM CHLORIDE 0.9 % IV BOLUS
1000.0000 mL | Freq: Once | INTRAVENOUS | Status: AC
  Administered 2021-09-01: 1000 mL via INTRAVENOUS

## 2021-09-01 MED ORDER — ONDANSETRON HCL 4 MG/2ML IJ SOLN
4.0000 mg | Freq: Once | INTRAMUSCULAR | Status: AC
  Administered 2021-09-01: 4 mg via INTRAVENOUS
  Filled 2021-09-01: qty 2

## 2021-09-01 NOTE — ED Provider Notes (Signed)
[facility] EMERGENCY DEPARTMENT Provider Note   CSN: [id number] Arrival date & time: 09/01/21  1605     History No chief complaint on file.   Bianca Hayes is a 30 y.o. female.  HPI  This patient is a 30 year old female, she has a history of ovarian cyst, also has a history of anemia and she reports plantar fasciitis, she has never had a kidney stone, has never had abdominal surgery, never had appendicitis, denies any history of pyelonephritis.  She reports that yesterday she had onset of some lower abdominal pelvic pain which seems to be midline and radiating to the bilateral sides, it is lower in the abdomen and associated with multiple episodes of nausea and vomiting numbering at least 10 in the last 12 hours.  She reports that the pain is making the bottom of her feet hurt as she has a history of plantar fasciitis.  She denies fevers or chills, denies diarrhea, denies vaginal discharge or bleeding, denies dysuria frequency or urgency.  Has had some over-the-counter medications for pain without significant relief.  Continues to be nauseated.  Past Medical History:  Diagnosis Date   Anemia    Ovarian cyst     There are no problems to display for this patient.   Past Surgical History:  Procedure Laterality Date   DENTAL SURGERY       OB History   No obstetric history on file.     History reviewed. No pertinent family history.  Social History   Tobacco Use   Smoking status: Never   Smokeless tobacco: Never  Substance Use Topics   Alcohol use: No   Drug use: No    Home Medications Prior to Admission medications   Medication Sig Start Date End Date Taking? Authorizing Provider  acetaminophen (TYLENOL) 325 MG tablet Take 2 tablets (650 mg total) by mouth every 6 (six) hours as needed. 02/18/20  Yes Sharman Cheek, MD  meloxicam (MOBIC) 7.5 MG tablet Take 1 tablet (7.5 mg total) by mouth in the morning and at bedtime. 09/01/21 10/01/21 Yes Eber Hong, MD   omeprazole (PRILOSEC) 20 MG capsule Take 20 mg by mouth daily.   Yes [provider]  diphenoxylate-atropine (LOMOTIL) 2.5-0.025 MG tablet Take 1-2 tablets by mouth 4 (four) times daily as needed for diarrhea or loose stools. Patient not taking: Reported on 09/01/2021 05/29/21   Bing Neighbors, FNP  etonogestrel (NEXPLANON) 68 MG IMPL implant 65 mg by Subdermal route once. 03/21/16   [provider]  ibuprofen (ADVIL) 200 MG tablet Take 400 mg by mouth every 6 (six) hours as needed. Patient not taking: Reported on 09/01/2021    [provider]  medroxyPROGESTERone (PROVERA) 10 MG tablet Take 2 tablets (20 mg total) by mouth daily. Patient not taking: No sig reported 02/18/20   Sharman Cheek, MD  naproxen (NAPROSYN) 500 MG tablet Take 1 tablet (500 mg total) by mouth 2 (two) times daily with a meal. Patient not taking: No sig reported 02/18/20   Sharman Cheek, MD  pantoprazole (PROTONIX) 40 MG tablet Take 1 tablet (40 mg total) by mouth daily. Patient not taking: Reported on 09/01/2021 03/29/18 03/29/19  Emily Filbert, MD  UNKNOWN TO PATIENT Take 1 tablet by mouth daily. Patient not taking: No sig reported    [provider]    Allergies    Coconut flavor, Iodine, Bactrim [sulfamethoxazole-trimethoprim], and Penicillins  Review of Systems   Review of Systems  All other systems reviewed and are  negative.  Physical Exam Updated Vital Signs BP (!) 152/85   Pulse 98   Temp 98.4 F (36.9 C)   Resp 20   Ht 1.753 m (5\' 9" )   Wt (!) 149.7 kg   SpO2 100%   BMI 48.73 kg/m   Physical Exam Vitals and nursing note reviewed.  Constitutional:      General: She is not in acute distress.    Appearance: She is well-developed.  HENT:     Head: Normocephalic and atraumatic.     Mouth/Throat:     Pharynx: No oropharyngeal exudate.  Eyes:     General: No scleral icterus.       Right eye: No discharge.        Left eye: No discharge.      Conjunctiva/sclera: Conjunctivae normal.     Pupils: Pupils are equal, round, and reactive to light.  Neck:     Thyroid: No thyromegaly.     Vascular: No JVD.  Cardiovascular:     Rate and Rhythm: Normal rate and regular rhythm.     Heart sounds: Normal heart sounds. No murmur heard.   No friction rub. No gallop.  Pulmonary:     Effort: Pulmonary effort is normal. No respiratory distress.     Breath sounds: Normal breath sounds. No wheezing or rales.  Abdominal:     General: Bowel sounds are normal. There is no distension.     Palpations: Abdomen is soft. There is no mass.     Tenderness: There is abdominal tenderness.     Comments: Suprapubic and left lower quadrant pelvic discomfort without guarding or peritoneal signs, no right upper or right lower quadrant tenderness  Musculoskeletal:        General: No tenderness. Normal range of motion.     Cervical back: Normal range of motion and neck supple.  Lymphadenopathy:     Cervical: No cervical adenopathy.  Skin:    General: Skin is warm and dry.     Findings: No erythema or rash.  Neurological:     Mental Status: She is alert.     Coordination: Coordination normal.  Psychiatric:        Behavior: Behavior normal.    ED Results / Procedures / Treatments   Labs (all labs ordered are listed, but only abnormal results are displayed) Labs Reviewed  CBC WITH DIFFERENTIAL/PLATELET - Abnormal; Notable for the following components:      Result Value   Hemoglobin 11.8 (*)    All other components within normal limits  COMPREHENSIVE METABOLIC PANEL - Abnormal; Notable for the following components:   Calcium 8.3 (*)    All other components within normal limits  URINALYSIS, ROUTINE W REFLEX MICROSCOPIC - Abnormal; Notable for the following components:   APPearance HAZY (*)    All other components within normal limits  RESP PANEL BY RT-PCR (FLU A&B, COVID) ARPGX2  LIPASE, BLOOD  PREGNANCY, URINE    EKG None  Radiology PELVIC  COMPLETE W TRANSVAGINAL AND TORSION R/O  Result Date: 09/01/2021 CLINICAL DATA:  Pelvic pain for 2 days. EXAM: TRANSABDOMINAL AND TRANSVAGINAL ULTRASOUND OF PELVIS DOPPLER ULTRASOUND OF OVARIES TECHNIQUE: Both transabdominal and transvaginal ultrasound examinations of the pelvis were performed. Transabdominal technique was performed for global imaging of the pelvis including uterus, ovaries, adnexal regions, and pelvic cul-de-sac. It was necessary to proceed with endovaginal exam following the transabdominal exam to visualize the endometrium and ovaries. Color and duplex Doppler ultrasound was utilized to evaluate blood flow  to the ovaries. COMPARISON:  None. FINDINGS: Uterus Measurements: 7.3 x 4.2 x 5.6 cm = volume: 89 mL. No fibroids or other mass visualized. Endometrium Thickness: 7 mm.  No focal abnormality visualized. Right ovary Measurements: A large thin walled cystic lesion is seen which contains an internal cyst, which resembles a mature graafian follicle. This measures 10.4 x 11.6 x 10.2 cm. No solid component visualized. No ovarian tissue is seen, however shows some internal blood flow is seen within the peripheral wall and internal cyst of this lesion. Left ovary Measurements: Not visualized, however no adnexal mass identified. Pulsed Doppler evaluation of the ovaries could not be directly performed due to nonvisualization of the left ovary and the right adnexal cyst described above. Low-resistance arterial and venous waveforms were obtained from within the thin walls of the right adnexal lesion. Other findings No abnormal free fluid. IMPRESSION: 11.6 cm probably benign cystic lesion in the right adnexa, which has features of a mature graafian follicle. Nonvisualization of left ovary. Normal appearance of uterus. Electronically Signed   By: Danae Orleans M.D.   On: 09/01/2021 17:45    Procedures Procedures   Medications Ordered in ED Medications  ketorolac (TORADOL) 30 MG/ML injection 30 mg (has  no administration in time range)  ondansetron (ZOFRAN) injection 4 mg (4 mg Intravenous Given 09/01/21 1739)  sodium chloride 0.9 % bolus 1,000 mL (1,000 mLs Intravenous New Bag/Given 09/01/21 1738)    ED Course  I have reviewed the triage vital signs and the nursing notes.  Pertinent labs & imaging results that were available during my care of the patient were reviewed by me and considered in my medical decision making (see chart for details).    MDM Rules/Calculators/A&P                           The patient states her multiple people in her household that are ill with 1 disease or another mostly respiratory illnesses but nobody with gastrointestinal illnesses.  She has some lower abdominal discomfort with nausea and vomiting which could indicate that this is related to a primary gastrointestinal process, it could be related to a kidney stone, urinary tract infection such as cystitis would also consider pyelonephritis or kidney stone, would also consider potentially viral illness such as influenza given other household members that have been ill as about 10% of people with influenza will have similar symptoms.  She will receive Zofran, some IV fluids and an investigation into the cause of her symptoms.  The patient is agreeable to the plan  Patient has been given Zofran, will give Toradol, IV fluids have been given, otherwise the patient is unremarkable, her ultrasound shows that she has a large cyst of her ovary, vital signs remain normal, she has not had any more nausea, she feels better and is stable for discharge.  She is not pregnant and has no urinary tract infection  Final Clinical Impression(s) / ED Diagnoses Final diagnoses:  Cyst of right ovary    Rx / DC Orders ED Discharge Orders          Ordered    meloxicam (MOBIC) 7.5 MG tablet  2 times daily        09/01/21 2005             Eber Hong, MD 09/01/21 2006

## 2021-09-01 NOTE — Discharge Instructions (Addendum)
You will need to follow-up with your OB/GYN regarding the cyst that you have on your ovary.  It is fairly large and is likely causing the pain in your pelvis.  I would recommend that you call them first thing on Monday morning to arrange follow-up early this week but I do want you to come back to the emergency department for severe or worsening pain vomiting or any other severe or worsening symptoms.

## 2021-09-01 NOTE — Progress Notes (Signed)
Virtual Visit Consent   Bianca Hayes, you are scheduled for a virtual visit with a [facility] provider today.     Just as with appointments in the office, your consent must be obtained to participate.  Your consent will be active for this visit and any virtual visit you may have with one of our providers in the next 365 days.     If you have a MyChart account, a copy of this consent can be sent to you electronically.  All virtual visits are billed to your insurance company just like a traditional visit in the office.    As this is a virtual visit, video technology does not allow for your provider to perform a traditional examination.  This may limit your provider's ability to fully assess your condition.  If your provider identifies any concerns that need to be evaluated in person or the need to arrange testing (such as labs, EKG, etc.), we will make arrangements to do so.     Although advances in technology are sophisticated, we cannot ensure that it will always work on either your end or our end.  If the connection with a video visit is poor, the visit may have to be switched to a telephone visit.  With either a video or telephone visit, we are not always able to ensure that we have a secure connection.     I need to obtain your verbal consent now.   Are you willing to proceed with your visit today?    Bianca Hayes has provided verbal consent on 09/01/2021 for a virtual visit (video or telephone).   Piedad Climes, [place]   Date: 09/01/2021 3:45 PM   Virtual Visit via Video Note   I, Piedad Climes, connected with  Bianca Hayes  ([id number], July 18, 1991) on 09/01/21 at  3:30 PM EDT by a video-enabled telemedicine application and verified that I am speaking with the correct person using two identifiers.  Location: Patient: Virtual Visit Location Patient: Home Provider: Virtual Visit Location Provider: Home Office   I discussed the limitations of evaluation and  management by telemedicine and the availability of in person appointments. The patient expressed understanding and agreed to proceed.    History of Present Illness: Bianca Hayes is a 30 y.o. who identifies as a female who was assigned female at birth, and is being seen today for abrupt onset of an acute illness. Notes family members have been under the weather recently with unknown viral illness (suspected). Today she had abrupt onset of frequent non-bloody emesis, fever and substantial periumbilical and lower abdominal pain. Denies melena or hematochezia. Has not been able to hydrate well although notes urinary output. Noting significant constant abdominopelvic pain that continues to worsen.   HPI: HPI  Problems: There are no problems to display for this patient.   Allergies:  Allergies  Allergen Reactions   Coconut Flavor Anaphylaxis   Iodine Hives and Shortness Of Breath   Bactrim [Sulfamethoxazole-Trimethoprim] Rash   Penicillins Rash   Medications:  Current Outpatient Medications:    acetaminophen (TYLENOL) 325 MG tablet, Take 2 tablets (650 mg total) by mouth every 6 (six) hours as needed., Disp: 60 tablet, Rfl: 0   diphenoxylate-atropine (LOMOTIL) 2.5-0.025 MG tablet, Take 1-2 tablets by mouth 4 (four) times daily as needed for diarrhea or loose stools., Disp: 30 tablet, Rfl: 0   etonogestrel (NEXPLANON) 68 MG IMPL implant, 65 mg by Subdermal route once., Disp: , Rfl:  ibuprofen (ADVIL) 200 MG tablet, Take 400 mg by mouth every 6 (six) hours as needed., Disp: , Rfl:    medroxyPROGESTERone (PROVERA) 10 MG tablet, Take 2 tablets (20 mg total) by mouth daily. (Patient not taking: No sig reported), Disp: 14 tablet, Rfl: 0   naproxen (NAPROSYN) 500 MG tablet, Take 1 tablet (500 mg total) by mouth 2 (two) times daily with a meal. (Patient not taking: No sig reported), Disp: 20 tablet, Rfl: 0   pantoprazole (PROTONIX) 40 MG tablet, Take 1 tablet (40 mg total) by mouth daily., Disp: 30  tablet, Rfl: 1   UNKNOWN TO PATIENT, Take 1 tablet by mouth daily. (Patient not taking: Reported on 01/04/2021), Disp: , Rfl:   Observations/Objective: Patient is well-developed, well-nourished in no acute distress.  Resting at home.  Head is normocephalic, atraumatic.  No labored breathing. Speech is clear and coherent with logical content.  Patient is alert and oriented at baseline.   Assessment and Plan: 1. Febrile illness 2. Lower abdominal pain Continuing to progressively worsen since onset < 24 hours prior. Now with subjective fevers, frequent vomiting and bilateral lower abdominal and periumbilical pain. Needs ER evaluation giving severity and progression of symptoms. She was triaged to nearest ER [facility]), having her significant other drive her there as it is very close to her home.   Follow Up Instructions: I discussed the assessment and treatment plan with the patient. The patient was provided an opportunity to ask questions and all were answered. The patient agreed with the plan and demonstrated an understanding of the instructions.  A copy of instructions were sent to the patient via MyChart unless otherwise noted below.   The patient was advised to call back or seek an in-person evaluation if the symptoms worsen or if the condition fails to improve as anticipated.  Time:  I spent 10 minutes with the patient via telehealth technology discussing the above problems/concerns.    Piedad Climes, PA-C

## 2021-09-01 NOTE — ED Notes (Signed)
Patient transported to Ultrasound 

## 2021-09-01 NOTE — ED Notes (Signed)
MD in room at time of triage for MSE. 

## 2021-09-01 NOTE — ED Triage Notes (Signed)
Patient reports pelvic pain with nausea and vomiting. Reports bilateral chronic foot pain. States that symptoms started two days ago.

## 2021-12-11 ENCOUNTER — Encounter (HOSPITAL_COMMUNITY): Payer: Self-pay | Admitting: Emergency Medicine

## 2021-12-11 ENCOUNTER — Emergency Department (HOSPITAL_COMMUNITY)
Admission: EM | Admit: 2021-12-11 | Discharge: 2021-12-11 | Disposition: A | Discharge status: Home / Self Care | Payer: Medicaid Other | Attending: Emergency Medicine | Admitting: Emergency Medicine

## 2021-12-11 ENCOUNTER — Other Ambulatory Visit: Payer: Self-pay

## 2021-12-11 DIAGNOSIS — D649 Anemia, unspecified: Secondary | ICD-10-CM | POA: Insufficient documentation

## 2021-12-11 DIAGNOSIS — R42 Dizziness and giddiness: Secondary | ICD-10-CM | POA: Insufficient documentation

## 2021-12-11 DIAGNOSIS — Z20822 Contact with and (suspected) exposure to covid-19: Secondary | ICD-10-CM | POA: Insufficient documentation

## 2021-12-11 DIAGNOSIS — N83209 Unspecified ovarian cyst, unspecified side: Secondary | ICD-10-CM | POA: Insufficient documentation

## 2021-12-11 DIAGNOSIS — T7840XA Allergy, unspecified, initial encounter: Secondary | ICD-10-CM | POA: Insufficient documentation

## 2021-12-11 DIAGNOSIS — J029 Acute pharyngitis, unspecified: Secondary | ICD-10-CM | POA: Insufficient documentation

## 2021-12-11 DIAGNOSIS — R0789 Other chest pain: Secondary | ICD-10-CM | POA: Insufficient documentation

## 2021-12-11 LAB — RESP PANEL BY RT-PCR (FLU A&B, COVID) ARPGX2
Influenza A by PCR: NEGATIVE
Influenza B by PCR: NEGATIVE
SARS Coronavirus 2 by RT PCR: NEGATIVE

## 2021-12-11 MED ORDER — FAMOTIDINE 20 MG PO TABS
20.0000 mg | ORAL_TABLET | Freq: Once | ORAL | Status: AC
  Administered 2021-12-11: 20 mg via ORAL
  Filled 2021-12-11: qty 1

## 2021-12-11 MED ORDER — DIPHENHYDRAMINE HCL 25 MG PO TABS: 25.0000 mg | ORAL_TABLET | Freq: Three times a day (TID) | ORAL | 0 refills | Status: DC

## 2021-12-11 MED ORDER — IBUPROFEN 400 MG PO TABS
600.0000 mg | ORAL_TABLET | Freq: Once | ORAL | Status: AC
  Administered 2021-12-11: 600 mg via ORAL
  Filled 2021-12-11: qty 2

## 2021-12-11 MED ORDER — PREDNISONE 10 MG PO TABS: 40.0000 mg | ORAL_TABLET | Freq: Every day | ORAL | 0 refills | Status: AC

## 2021-12-11 MED ORDER — ONDANSETRON 4 MG PO TBDP
4.0000 mg | ORAL_TABLET | Freq: Once | ORAL | Status: AC
  Administered 2021-12-11: 4 mg via ORAL
  Filled 2021-12-11: qty 1

## 2021-12-11 MED ORDER — PREDNISONE 50 MG PO TABS
60.0000 mg | ORAL_TABLET | Freq: Once | ORAL | Status: AC
  Administered 2021-12-11: 60 mg via ORAL
  Filled 2021-12-11: qty 1

## 2021-12-11 MED ORDER — DIPHENHYDRAMINE HCL 25 MG PO CAPS
25.0000 mg | ORAL_CAPSULE | Freq: Once | ORAL | Status: AC
  Administered 2021-12-11: 25 mg via ORAL
  Filled 2021-12-11: qty 1

## 2021-12-11 NOTE — Discharge Instructions (Addendum)
Medications were sent to your pharmacy for ongoing treatment of possible allergic reaction.  Take as prescribed.  If you develop recurrence or worsening of symptoms, please return to the emergency department.  Call the number below to establish a primary care doctor if you do not have one already.

## 2021-12-11 NOTE — ED Provider Notes (Signed)
[facility] Provider Note   CSN: [id number] Arrival date & time: 12/11/21  [id number]     History  Chief Complaint  Patient presents with   Shortness of Breath    Bianca Hayes is a 31 y.o. female.   Shortness of Breath Associated symptoms: rash and sore throat   Patient presents for concern of allergic reaction.  She experienced throat pain, throat tightness, chest tightness and urticaria.  Onset was this morning.  Patient reports that she does have history of shellfish allergy but has not had any recent exposure to any allergens.  She was in her normal state of health yesterday including at bedtime.  This morning, despite her symptoms, patient did go to work.  While at work, she felt that she was having worsening symptoms and had an episode of dizziness.  For this reason, she presents to the emergency department.    Home Medications Prior to Admission medications   Medication Sig Start Date End Date Taking? Authorizing Provider  acetaminophen (TYLENOL) 325 MG tablet Take 2 tablets (650 mg total) by mouth every 6 (six) hours as needed. Patient taking differently: Take 650 mg by mouth every 6 (six) hours as needed for mild pain. 02/18/20  Yes Carrie Mew, MD  diphenhydrAMINE (BENADRYL) 25 MG tablet Take 1 tablet (25 mg total) by mouth every 8 (eight) hours for 3 days. 12/11/21 12/14/21 Yes Godfrey Pick, MD  etonogestrel (NEXPLANON) 68 MG IMPL implant 65 mg by Subdermal route once. 03/21/16  Yes [provider]  ibuprofen (ADVIL) 200 MG tablet Take 400 mg by mouth every 6 (six) hours as needed for mild pain.   Yes [provider]  omeprazole (PRILOSEC) 20 MG capsule Take 20 mg by mouth daily.   Yes [provider]  predniSONE (DELTASONE) 10 MG tablet Take 4 tablets (40 mg total) by mouth daily for 3 days. 12/11/21 12/14/21 Yes Godfrey Pick, MD  diphenoxylate-atropine (LOMOTIL) 2.5-0.025 MG tablet Take 1-2 tablets by mouth 4 (four) times  daily as needed for diarrhea or loose stools. Patient not taking: Reported on 09/01/2021 05/29/21   Scot Jun, FNP  medroxyPROGESTERone (PROVERA) 10 MG tablet Take 2 tablets (20 mg total) by mouth daily. Patient not taking: Reported on 01/04/2021 02/18/20   Carrie Mew, MD  naproxen (NAPROSYN) 500 MG tablet Take 1 tablet (500 mg total) by mouth 2 (two) times daily with a meal. Patient not taking: Reported on 01/04/2021 02/18/20   Carrie Mew, MD  pantoprazole (PROTONIX) 40 MG tablet Take 1 tablet (40 mg total) by mouth daily. Patient not taking: Reported on 12/11/2021 03/29/18 03/29/19  Earleen Newport, MD      Allergies    Coconut flavor, Iodine, Bactrim [sulfamethoxazole-trimethoprim], and Penicillins    Review of Systems   Review of Systems  HENT:  Positive for sore throat.   Respiratory:  Positive for chest tightness and shortness of breath.   Skin:  Positive for rash.   Physical Exam Updated Vital Signs BP 140/82 (BP Location: Right Arm)    Pulse 82    Temp 98 F (36.7 C) (Oral)    Resp 18    Ht 5\' 9"  (1.753 m)    Wt (!) 149.7 kg    LMP  (LMP Unknown)    SpO2 100%    BMI 48.74 kg/m  Physical Exam Vitals and nursing note reviewed.  Constitutional:      General: She is not in acute distress.    Appearance: She  is well-developed. She is not ill-appearing, toxic-appearing or diaphoretic.  HENT:     Head: Normocephalic and atraumatic.     Mouth/Throat:     Mouth: Mucous membranes are moist.     Pharynx: Oropharynx is clear. No pharyngeal swelling or oropharyngeal exudate.  Eyes:     Extraocular Movements: Extraocular movements intact.     Conjunctiva/sclera: Conjunctivae normal.  Neck:     Vascular: No JVD.  Cardiovascular:     Rate and Rhythm: Normal rate and regular rhythm.     Heart sounds: No murmur heard. Pulmonary:     Effort: Pulmonary effort is normal. No tachypnea, accessory muscle usage or respiratory distress.     Breath sounds: Normal breath  sounds. No wheezing, rhonchi or rales.  Abdominal:     Palpations: Abdomen is soft.     Tenderness: There is no abdominal tenderness.  Musculoskeletal:        General: No swelling. Normal range of motion.     Cervical back: Normal range of motion and neck supple.     Right lower leg: No edema.     Left lower leg: No edema.  Skin:    General: Skin is warm and dry.     Capillary Refill: Capillary refill takes less than 2 seconds.     Findings: No rash.  Neurological:     General: No focal deficit present.     Mental Status: She is alert and oriented to person, place, and time.     Cranial Nerves: No cranial nerve deficit.     Motor: No weakness.  Psychiatric:        Mood and Affect: Mood normal.        Behavior: Behavior normal.    ED Results / Procedures / Treatments   Labs (all labs ordered are listed, but only abnormal results are displayed) Labs Reviewed  RESP PANEL BY RT-PCR (FLU A&B, COVID) ARPGX2    EKG None  Radiology No results found.  Procedures Procedures    Medications Ordered in ED Medications  predniSONE (DELTASONE) tablet 60 mg (60 mg Oral Given 12/11/21 1115)  diphenhydrAMINE (BENADRYL) capsule 25 mg (25 mg Oral Given 12/11/21 1116)  ondansetron (ZOFRAN-ODT) disintegrating tablet 4 mg (4 mg Oral Given 12/11/21 1116)  famotidine (PEPCID) tablet 20 mg (20 mg Oral Given 12/11/21 1116)  ibuprofen (ADVIL) tablet 600 mg (600 mg Oral Given 12/11/21 1118)    ED Course/ Medical Decision Making/ A&P                           Medical Decision Making Risk OTC drugs. Prescription drug management.   This patient presents to the ED for concern of allergic reaction, this involves an extensive number of treatment options, and is a complaint that carries with it a high risk of complications and morbidity.  The differential diagnosis includes allergic reaction, anaphylaxis, viral illness   Co morbidities that complicate the patient evaluation  Anemia, ovarian  cyst   Additional history obtained:  Additional history obtained from N/A External records from outside source obtained and reviewed including EMR   Lab Tests:  I Ordered, and personally interpreted labs.  The pertinent results include: Negative for COVID and flu  Cardiac Monitoring:  The patient was maintained on a cardiac monitor.  I personally viewed and interpreted the cardiac monitored which showed an underlying rhythm of: Sinus rhythm   Medicines ordered and prescription drug management:  I ordered medication including  prednisone, Pepcid, and Benadryl for empiric treatment of an allergic reaction, Advil for sore throat, Zofran for nausea Reevaluation of the patient after these medicines showed that the patient improved I have reviewed the patients home medicines and have made adjustments as needed   Problem List / ED Course:  Patient presents for concern of allergic reaction.  She did experience throat pain, throat itchiness, throat tightness, chest tightness, and urticaria starting this morning.  Allergic triggers unknown.  On arrival in the emergency department, vital signs are normal.  Patient is well-appearing on exam.  She has no evidence of rash.  Lungs are clear to auscultation without any evidence of wheezing.  Her breathing is unlabored.  Oropharynx is widely patent without any evidence of swelling.  Abdomen is soft and nontender.  Due to her reported symptoms, patient was given steroid and histamine blockers.  Due to her reported sore throat and nausea, Advil and Zofran ordered for symptomatic relief.  Patient was monitored in the ED for 4 hours with no worsening of symptoms.  Return precautions were given.  Patient was prescribed continued steroids and histamine blockade for the next 3 days.  She is stable for discharge at this time.   Reevaluation:  After the interventions noted above, I reevaluated the patient and found that they have :improved   Social  Determinants of Health:  Uninsured   Dispostion:  After consideration of the diagnostic results and the patients response to treatment, I feel that the patent would benefit from discharge.          Final Clinical Impression(s) / ED Diagnoses Final diagnoses:  Allergic reaction, initial encounter    Rx / DC Orders ED Discharge Orders          Ordered    predniSONE (DELTASONE) 10 MG tablet  Daily        12/11/21 1425    diphenhydrAMINE (BENADRYL) 25 MG tablet  Every 8 hours        12/11/21 1425              Godfrey Pick, MD 12/11/21 1851

## 2021-12-11 NOTE — ED Triage Notes (Signed)
Pt to the ED with complaints of shortness of breath. Pt states her throat feels like it is closing up and she has a rash as if from allergic reaction.  NADN, no rash noted, pt ambulated to the room from triage.

## 2021-12-26 ENCOUNTER — Encounter: Payer: Self-pay | Admitting: Emergency Medicine

## 2021-12-26 ENCOUNTER — Ambulatory Visit
Admission: EM | Admit: 2021-12-26 | Discharge: 2021-12-26 | Disposition: A | Discharge status: Home / Self Care | Payer: Self-pay | Attending: Student | Admitting: Student

## 2021-12-26 ENCOUNTER — Other Ambulatory Visit: Payer: Self-pay

## 2021-12-26 DIAGNOSIS — Z20822 Contact with and (suspected) exposure to covid-19: Secondary | ICD-10-CM

## 2021-12-26 DIAGNOSIS — Z789 Other specified health status: Secondary | ICD-10-CM

## 2021-12-26 DIAGNOSIS — A084 Viral intestinal infection, unspecified: Secondary | ICD-10-CM

## 2021-12-26 MED ORDER — ONDANSETRON 8 MG PO TBDP: 8.0000 mg | ORAL_TABLET | Freq: Three times a day (TID) | ORAL | 0 refills | Status: DC | PRN

## 2021-12-26 MED ORDER — ONDANSETRON 4 MG PO TBDP
4.0000 mg | ORAL_TABLET | Freq: Once | ORAL | Status: AC
  Administered 2021-12-26: 4 mg via ORAL

## 2021-12-26 NOTE — ED Triage Notes (Signed)
Vomiting, eyes itching, nasal congestion and body aches since Sunday

## 2021-12-26 NOTE — ED Provider Notes (Signed)
[facility]    CSN: [id number] Arrival date & time: 12/26/21  1522      History   Chief Complaint No chief complaint on file.   HPI Bianca Hayes is a 31 y.o. female presenting with nausea with vomiting and diarrhea x4 days. History noncontributory. Describes 10 episodes of bilious vomiting today, and about 10 episodes of watery diarrhea. Generalized crampy abd pain. Tolerating fluids but not foods. Has attempted omeprazole without relief. Denies recent eating out or travel. Denies ST, CP, SOB.   HPI  Past Medical History:  Diagnosis Date   Anemia    Ovarian cyst     There are no problems to display for this patient.   Past Surgical History:  Procedure Laterality Date   DENTAL SURGERY      OB History   No obstetric history on file.      Home Medications    Prior to Admission medications   Medication Sig Start Date End Date Taking? Authorizing Provider  ondansetron (ZOFRAN-ODT) 8 MG disintegrating tablet Take 1 tablet (8 mg total) by mouth every 8 (eight) hours as needed for nausea or vomiting. 12/26/21  Yes Rhys Martini, PA-C  acetaminophen (TYLENOL) 325 MG tablet Take 2 tablets (650 mg total) by mouth every 6 (six) hours as needed. Patient taking differently: Take 650 mg by mouth every 6 (six) hours as needed for mild pain. 02/18/20   Sharman Cheek, MD  diphenhydrAMINE (BENADRYL) 25 MG tablet Take 1 tablet (25 mg total) by mouth every 8 (eight) hours for 3 days. 12/11/21 12/14/21  Gloris Manchester, MD  diphenoxylate-atropine (LOMOTIL) 2.5-0.025 MG tablet Take 1-2 tablets by mouth 4 (four) times daily as needed for diarrhea or loose stools. Patient not taking: Reported on 09/01/2021 05/29/21   Bing Neighbors, FNP  etonogestrel (NEXPLANON) 68 MG IMPL implant 65 mg by Subdermal route once. 03/21/16   [provider]  ibuprofen (ADVIL) 200 MG tablet Take 400 mg by mouth every 6 (six) hours as needed for mild pain.    [provider]   medroxyPROGESTERone (PROVERA) 10 MG tablet Take 2 tablets (20 mg total) by mouth daily. Patient not taking: Reported on 01/04/2021 02/18/20   Sharman Cheek, MD  naproxen (NAPROSYN) 500 MG tablet Take 1 tablet (500 mg total) by mouth 2 (two) times daily with a meal. Patient not taking: Reported on 01/04/2021 02/18/20   Sharman Cheek, MD  omeprazole (PRILOSEC) 20 MG capsule Take 20 mg by mouth daily.    [provider]  pantoprazole (PROTONIX) 40 MG tablet Take 1 tablet (40 mg total) by mouth daily. Patient not taking: Reported on 12/11/2021 03/29/18 03/29/19  Emily Filbert, MD    Family History History reviewed. No pertinent family history.  Social History Social History   Tobacco Use   Smoking status: Never   Smokeless tobacco: Never  Vaping Use   Vaping Use: Never used  Substance Use Topics   Alcohol use: No   Drug use: No     Allergies   Coconut flavor, Iodine, Bactrim [sulfamethoxazole-trimethoprim], and Penicillins   Review of Systems Review of Systems  Gastrointestinal:  Positive for abdominal pain, diarrhea, nausea and vomiting.  All other systems reviewed and are negative.   Physical Exam Triage Vital Signs ED Triage Vitals [12/26/21 1529]  Enc Vitals Group     BP 131/83     Pulse Rate (!) 112     Resp 18     Temp 98.9 F (37.2  C)     Temp Source Oral     SpO2 96 %     Weight      Height      Head Circumference      Peak Flow      Pain Score      Pain Loc      Pain Edu?      Excl. in GC?    No data found.  Updated Vital Signs BP 131/83 (BP Location: Right Arm)    Pulse (!) 112    Temp 98.9 F (37.2 C) (Oral)    Resp 18    LMP  (LMP Unknown)    SpO2 96%   Visual Acuity Right Eye Distance:   Left Eye Distance:   Bilateral Distance:    Right Eye Near:   Left Eye Near:    Bilateral Near:     Physical Exam Vitals reviewed.  Constitutional:      General: She is not in acute distress.    Appearance: Normal appearance. She is  obese. She is not ill-appearing.  HENT:     Head: Normocephalic and atraumatic.     Mouth/Throat:     Mouth: Mucous membranes are moist.     Comments: Moist mucous membranes Eyes:     Extraocular Movements: Extraocular movements intact.     Pupils: Pupils are equal, round, and reactive to light.  Cardiovascular:     Rate and Rhythm: Regular rhythm. Tachycardia present.     Heart sounds: Normal heart sounds.  Pulmonary:     Effort: Pulmonary effort is normal.     Breath sounds: Normal breath sounds. No wheezing, rhonchi or rales.  Abdominal:     General: Bowel sounds are normal. There is no distension.     Palpations: Abdomen is soft. There is no mass.     Tenderness: There is no abdominal tenderness. There is no right CVA tenderness, left CVA tenderness, guarding or rebound. Negative signs include Murphy's sign, Rovsing's sign and McBurney's sign.     Comments: Exam limited due to body habitus. No pain to palpation.  Skin:    General: Skin is warm.     Capillary Refill: Capillary refill takes 2 to 3 seconds.     Comments: Good skin turgor  Neurological:     General: No focal deficit present.     Mental Status: She is alert and oriented to person, place, and time.  Psychiatric:        Mood and Affect: Mood normal.        Behavior: Behavior normal.     UC Treatments / Results  Labs (all labs ordered are listed, but only abnormal results are displayed) Labs Reviewed  COVID-19, FLU A+B NAA    EKG   Radiology No results found.  Procedures Procedures (including critical care time)  Medications Ordered in UC Medications  ondansetron (ZOFRAN-ODT) disintegrating tablet 4 mg (4 mg Oral Given 12/26/21 1554)    Initial Impression / Assessment and Plan / UC Course  I have reviewed the triage vital signs and the nursing notes.  Pertinent labs & imaging results that were available during my care of the patient were reviewed by me and considered in my medical decision making  (see chart for details).     This patient is a very pleasant 31 y.o. year old female presenting with viral gastroenteritis. Afebrile but tachy. Appears fairly well hydrated. Implant contraception.  Zofran ODT administered during visit. Zofran ODT sent to pharmacy.  Good hydration, BRAT diet.   Discussed our clinic's work note policy and that I can only write her out for 3 days including today. ED return precautions discussed. Patient verbalizes understanding and agreement.   Coding Level 4 for acute illness with systemic symptoms, and prescription drug management     Final Clinical Impressions(s) / UC Diagnoses   Final diagnoses:  Exposure to COVID-19 virus  Viral gastroenteritis  Uses contraceptive implant for birth control     Discharge Instructions      -Take the Zofran (ondansetron) up to 3 times daily for nausea and vomiting. -Take the Imodium (loperamide) up to 4 times daily for diarrhea. -Drink plenty of fluids and eat a bland diet    ED Prescriptions     Medication Sig Dispense Auth. Provider   ondansetron (ZOFRAN-ODT) 8 MG disintegrating tablet Take 1 tablet (8 mg total) by mouth every 8 (eight) hours as needed for nausea or vomiting. 5 tablet Rhys Martini, PA-C      PDMP not reviewed this encounter.   Rhys Martini, PA-C 12/26/21 1616

## 2021-12-26 NOTE — Discharge Instructions (Addendum)
-  Take the Zofran (ondansetron) up to 3 times daily for nausea and vomiting. -Take the Imodium (loperamide) up to 4 times daily for diarrhea. -Drink plenty of fluids and eat a bland diet

## 2021-12-27 LAB — COVID-19, FLU A+B NAA
Influenza A, NAA: NOT DETECTED
Influenza B, NAA: NOT DETECTED
SARS-CoV-2, NAA: NOT DETECTED

## 2022-04-24 ENCOUNTER — Encounter: Payer: Self-pay | Admitting: *Deleted

## 2022-04-24 ENCOUNTER — Ambulatory Visit
Admission: EM | Admit: 2022-04-24 | Discharge: 2022-04-24 | Disposition: A | Discharge status: Home / Self Care | Payer: Medicaid Other | Attending: Family Medicine | Admitting: Family Medicine

## 2022-04-24 DIAGNOSIS — R112 Nausea with vomiting, unspecified: Secondary | ICD-10-CM

## 2022-04-24 DIAGNOSIS — J029 Acute pharyngitis, unspecified: Secondary | ICD-10-CM | POA: Diagnosis not present

## 2022-04-24 DIAGNOSIS — R519 Headache, unspecified: Secondary | ICD-10-CM

## 2022-04-24 HISTORY — DX: Unspecified asthma, uncomplicated: J45.909

## 2022-04-24 HISTORY — DX: Unspecified fracture of skull, initial encounter for closed fracture: S02.91XA

## 2022-04-24 HISTORY — DX: Personal history of (healed) traumatic fracture: Z87.81

## 2022-04-24 HISTORY — DX: Gastro-esophageal reflux disease without esophagitis: K21.9

## 2022-04-24 LAB — POCT RAPID STREP A (OFFICE): Rapid Strep A Screen: NEGATIVE

## 2022-04-24 LAB — POCT URINE PREGNANCY: Preg Test, Ur: NEGATIVE

## 2022-04-24 MED ORDER — AZITHROMYCIN 250 MG PO TABS: ORAL_TABLET | ORAL | 0 refills | Status: DC

## 2022-04-24 MED ORDER — ONDANSETRON HCL 4 MG PO TABS: 4.0000 mg | ORAL_TABLET | Freq: Four times a day (QID) | ORAL | 0 refills | Status: DC

## 2022-04-24 MED ORDER — ONDANSETRON 4 MG PO TBDP
4.0000 mg | ORAL_TABLET | Freq: Once | ORAL | Status: AC
  Administered 2022-04-24: 4 mg via ORAL

## 2022-04-25 LAB — NOVEL CORONAVIRUS, NAA: SARS-CoV-2, NAA: NOT DETECTED

## 2022-04-27 LAB — CULTURE, GROUP A STREP (THRC)

## 2022-04-30 ENCOUNTER — Ambulatory Visit
Admission: EM | Admit: 2022-04-30 | Discharge: 2022-04-30 | Disposition: A | Discharge status: Home / Self Care | Payer: Medicaid Other | Attending: Nurse Practitioner | Admitting: Nurse Practitioner

## 2022-04-30 DIAGNOSIS — R590 Localized enlarged lymph nodes: Secondary | ICD-10-CM

## 2022-04-30 DIAGNOSIS — R112 Nausea with vomiting, unspecified: Secondary | ICD-10-CM | POA: Insufficient documentation

## 2022-04-30 DIAGNOSIS — R519 Headache, unspecified: Secondary | ICD-10-CM

## 2022-04-30 LAB — POCT URINALYSIS DIP (MANUAL ENTRY)
Bilirubin, UA: NEGATIVE
Blood, UA: NEGATIVE
Glucose, UA: NEGATIVE mg/dL
Ketones, POC UA: NEGATIVE mg/dL
Nitrite, UA: NEGATIVE
Spec Grav, UA: 1.025 (ref 1.010–1.025)
Urobilinogen, UA: 1 E.U./dL
pH, UA: 7 (ref 5.0–8.0)

## 2022-04-30 MED ORDER — ONDANSETRON 4 MG PO TBDP: 4.0000 mg | ORAL_TABLET | Freq: Three times a day (TID) | ORAL | 0 refills | Status: DC | PRN

## 2022-04-30 MED ORDER — KETOROLAC TROMETHAMINE 60 MG/2ML IM SOLN
60.0000 mg | Freq: Once | INTRAMUSCULAR | Status: AC
  Administered 2022-04-30: 60 mg via INTRAMUSCULAR

## 2022-04-30 MED ORDER — IBUPROFEN 800 MG PO TABS: 800.0000 mg | ORAL_TABLET | Freq: Three times a day (TID) | ORAL | 0 refills | Status: DC

## 2022-04-30 MED ORDER — ONDANSETRON 4 MG PO TBDP
4.0000 mg | ORAL_TABLET | Freq: Once | ORAL | Status: AC
  Administered 2022-04-30: 4 mg via ORAL

## 2022-04-30 NOTE — ED Provider Notes (Signed)
[facility]    CSN: [id number] Arrival date & time: 04/30/22  1040      History   Chief Complaint Chief Complaint  Patient presents with   Nausea   Emesis   Diarrhea        Headache    HPI Bianca Hayes is a 31 y.o. female.   The history is provided by the patient.    The patient is a 31 year old female who presents with nausea, vomiting, diarrhea, and headache that has been present for the past week.  Patient was seen on 04/24/2022 for the same or similar symptoms.  COVID, strep, flu tests were performed, all were negative.  Patient was prescribed azithromycin and Zofran for her symptoms.  Patient states she has been unable to get the Zofran due to cost.  She continues to have nausea and vomiting.  She states she has vomited 7 times this morning.  She states that the only thing she can keep down is water.  She also complains of blurred vision with her headache that comes and goes.  She denies fever, chills, nasal congestion, runny nose, cough, abdominal pain, or urinary symptoms.  Patient also has swelling to the right side of her neck that has been present for the past 3 to 4 months.  This was noted on her previous exam 1 week ago.  Patient reports a history of migraine headaches. Past Medical History:  Diagnosis Date   Anemia    Asthma    GERD (gastroesophageal reflux disease)    H/O cervical fracture    Ovarian cyst    Skull fracture (HCC)     There are no problems to display for this patient.   Past Surgical History:  Procedure Laterality Date   DENTAL SURGERY      OB History   No obstetric history on file.      Home Medications    Prior to Admission medications   Medication Sig Start Date End Date Taking? Authorizing Provider  ibuprofen (ADVIL) 800 MG tablet Take 1 tablet (800 mg total) by mouth 3 (three) times daily. 04/30/22  Yes Bailley Guilford-Warren, Sadie Haber, NP  ondansetron (ZOFRAN-ODT) 4 MG disintegrating tablet Take 1 tablet (4 mg total) by  mouth every 8 (eight) hours as needed for nausea or vomiting. 04/30/22  Yes Quavion Boule-Warren, Sadie Haber, NP  acetaminophen (TYLENOL) 325 MG tablet Take 2 tablets (650 mg total) by mouth every 6 (six) hours as needed. Patient taking differently: Take 650 mg by mouth every 6 (six) hours as needed for mild pain. 02/18/20   Sharman Cheek, MD  azithromycin (ZITHROMAX) 250 MG tablet Take first 2 tablets together, then 1 every day until finished. 04/24/22   Particia Nearing, PA-C  diphenhydrAMINE (BENADRYL) 25 MG tablet Take 1 tablet (25 mg total) by mouth every 8 (eight) hours for 3 days. 12/11/21 12/14/21  Gloris Manchester, MD  diphenoxylate-atropine (LOMOTIL) 2.5-0.025 MG tablet Take 1-2 tablets by mouth 4 (four) times daily as needed for diarrhea or loose stools. Patient not taking: Reported on 09/01/2021 05/29/21   Bing Neighbors, FNP  etonogestrel (NEXPLANON) 68 MG IMPL implant 65 mg by Subdermal route once. 03/21/16   [provider]  medroxyPROGESTERone (PROVERA) 10 MG tablet Take 2 tablets (20 mg total) by mouth daily. Patient not taking: Reported on 01/04/2021 02/18/20   Sharman Cheek, MD  naproxen (NAPROSYN) 500 MG tablet Take 1 tablet (500 mg total) by mouth 2 (two) times daily with a meal. Patient not  taking: Reported on 01/04/2021 02/18/20   Sharman Cheek, MD  omeprazole (PRILOSEC) 20 MG capsule Take 20 mg by mouth daily.    [provider]  ondansetron (ZOFRAN) 4 MG tablet Take 1 tablet (4 mg total) by mouth every 6 (six) hours. 04/24/22   Particia Nearing, PA-C  pantoprazole (PROTONIX) 40 MG tablet Take 1 tablet (40 mg total) by mouth daily. Patient not taking: Reported on 12/11/2021 03/29/18 03/29/19  Emily Filbert, MD    Family History Family History  Problem Relation Age of Onset   Hypertension Mother    Congestive Heart Failure Mother    COPD Mother    Hypertension Father    Immunodeficiency Father     Social History Social History   Tobacco Use    Smoking status: Never   Smokeless tobacco: Never  Vaping Use   Vaping Use: Never used  Substance Use Topics   Alcohol use: No   Drug use: No     Allergies   Coconut flavor, Iodine, Bactrim [sulfamethoxazole-trimethoprim], and Penicillins   Review of Systems Review of Systems Per HPI  Physical Exam Triage Vital Signs ED Triage Vitals  Enc Vitals Group     BP 04/30/22 1122 (!) 149/92     Pulse Rate 04/30/22 1122 84     Resp 04/30/22 1122 18     Temp 04/30/22 1122 98 F (36.7 C)     Temp Source 04/30/22 1122 Oral     SpO2 04/30/22 1122 98 %     Weight --      Height --      Head Circumference --      Peak Flow --      Pain Score 04/30/22 1121 10     Pain Loc --      Pain Edu? --      Excl. in GC? --    No data found.  Updated Vital Signs BP (!) 149/92 (BP Location: Right Arm)   Pulse 84   Temp 98 F (36.7 C) (Oral)   Resp 18   LMP 03/10/2022 (Approximate)   SpO2 98%   Visual Acuity Right Eye Distance: 20/40 (Without correction) Left Eye Distance: 20/30 (Without correction) Bilateral Distance: 20/25 (Without correction)  Right Eye Near:   Left Eye Near:    Bilateral Near:     Physical Exam Vitals and nursing note reviewed.  Constitutional:      General: She is not in acute distress.    Appearance: She is well-developed.  HENT:     Head: Normocephalic and atraumatic.     Right Ear: Tympanic membrane, ear canal and external ear normal.     Left Ear: Tympanic membrane, ear canal and external ear normal.     Mouth/Throat:     Mouth: Mucous membranes are moist.  Eyes:     Extraocular Movements: Extraocular movements intact.     Conjunctiva/sclera: Conjunctivae normal.     Pupils: Pupils are equal, round, and reactive to light.  Cardiovascular:     Rate and Rhythm: Regular rhythm.     Heart sounds: No murmur heard. Pulmonary:     Effort: Pulmonary effort is normal. No respiratory distress.     Breath sounds: Normal breath sounds.  Abdominal:      General: Bowel sounds are normal.     Palpations: Abdomen is soft.     Tenderness: There is no abdominal tenderness. There is no right CVA tenderness, left CVA tenderness, guarding or rebound.  Musculoskeletal:  General: No swelling.     Cervical back: Normal range of motion.  Lymphadenopathy:     Cervical: Cervical adenopathy (right anterior) present.  Skin:    General: Skin is warm and dry.     Capillary Refill: Capillary refill takes less than 2 seconds.  Neurological:     General: No focal deficit present.     Mental Status: She is alert and oriented to person, place, and time.     GCS: GCS eye subscore is 4. GCS verbal subscore is 5. GCS motor subscore is 6.  Psychiatric:        Mood and Affect: Mood normal.        Behavior: Behavior normal.      UC Treatments / Results  Labs (all labs ordered are listed, but only abnormal results are displayed) Labs Reviewed  POCT URINALYSIS DIP (MANUAL ENTRY) - Abnormal; Notable for the following components:      Result Value   Clarity, UA cloudy (*)    Protein Ur, POC trace (*)    Leukocytes, UA Trace (*)    All other components within normal limits  URINE CULTURE  CBC WITH DIFFERENTIAL/PLATELET    EKG   Radiology No results found.  Procedures Procedures (including critical care time)  Medications Ordered in UC Medications  ondansetron (ZOFRAN-ODT) disintegrating tablet 4 mg (4 mg Oral Given 04/30/22 1210)  ketorolac (TORADOL) injection 60 mg (60 mg Intramuscular Given 04/30/22 1210)    Initial Impression / Assessment and Plan / UC Course  I have reviewed the triage vital signs and the nursing notes.  Pertinent labs & imaging results that were available during my care of the patient were reviewed by me and considered in my medical decision making (see chart for details).  Patient presents for continued nausea, vomiting, and headache.  She has since developed blurred vision.  Patient was seen on 04/24/2022 for the same  or similar symptoms.  Her vital signs are stable, exam is otherwise benign.  Her urinalysis does show trace leukocytes, urine culture is pending.  Patient was prescribed symptomatic treatment.  Supportive care recommendations were provided to the patient.  With regard to her cervical adenopathy, she was advised that she should follow-up with primary care physician as soon as possible for further evaluation.  Indications of when to go to the emergency department were provided.  Follow-up as needed.  Final Clinical Impressions(s) / UC Diagnoses   Final diagnoses:  Anterior cervical adenopathy  Nausea and vomiting, unspecified vomiting type  Intractable headache, unspecified chronicity pattern, unspecified headache type     Discharge Instructions      Take medication as prescribed. Increase fluids and allow for plenty of rest. Recommend a brat diet and a clear liquid diet until your symptoms improve.  This includes bananas, rice, applesauce, toast, soups, or broths. Apply cool cloths to your forehead to help with headache pain. Recommend a quiet, dark room to help with your headache symptoms. If your symptoms of nausea and vomiting continue to persist within the next 24 to 48 hours, please go to the emergency department immediately for further evaluation. You will need to follow-up with your primary care physician regarding the swelling in your neck.     ED Prescriptions     Medication Sig Dispense Auth. Provider   ondansetron (ZOFRAN-ODT) 4 MG disintegrating tablet Take 1 tablet (4 mg total) by mouth every 8 (eight) hours as needed for nausea or vomiting. 20 tablet Jaran Sainz-Warren, Sadie Haber, NP  ibuprofen (ADVIL) 800 MG tablet Take 1 tablet (800 mg total) by mouth 3 (three) times daily. 30 tablet Hadyn Azer-Warren, Sadie Haber, NP      PDMP not reviewed this encounter.   Abran Cantor, NP 04/30/22 1251

## 2022-04-30 NOTE — Discharge Instructions (Addendum)
Take medication as prescribed. Increase fluids and allow for plenty of rest. Recommend a brat diet and a clear liquid diet until your symptoms improve.  This includes bananas, rice, applesauce, toast, soups, or broths. Apply cool cloths to your forehead to help with headache pain. Recommend a quiet, dark room to help with your headache symptoms. If your symptoms of nausea and vomiting continue to persist within the next 24 to 48 hours, please go to the emergency department immediately for further evaluation. You will need to follow-up with your primary care physician regarding the swelling in your neck.

## 2022-04-30 NOTE — ED Triage Notes (Signed)
Pt reports diarrhea, nausea, vomiting, double vision and headache x 1 week. Reports she was seen for the same symptoms here and was told to return. Pt finished azithromycin without relief.    Pt reports she had negative pregnancy test, Flu, COVID and Strep.

## 2022-05-01 LAB — CBC WITH DIFFERENTIAL/PLATELET
Basophils Absolute: 0 10*3/uL (ref 0.0–0.2)
Basos: 0 %
EOS (ABSOLUTE): 0.1 10*3/uL (ref 0.0–0.4)
Eos: 1 %
Hematocrit: 34.7 % (ref 34.0–46.6)
Hemoglobin: 11.7 g/dL (ref 11.1–15.9)
Immature Grans (Abs): 0 10*3/uL (ref 0.0–0.1)
Immature Granulocytes: 0 %
Lymphocytes Absolute: 1 10*3/uL (ref 0.7–3.1)
Lymphs: 14 %
MCH: 28.3 pg (ref 26.6–33.0)
MCHC: 33.7 g/dL (ref 31.5–35.7)
MCV: 84 fL (ref 79–97)
Monocytes Absolute: 0.4 10*3/uL (ref 0.1–0.9)
Monocytes: 6 %
Neutrophils Absolute: 5.5 10*3/uL (ref 1.4–7.0)
Neutrophils: 79 %
Platelets: 211 10*3/uL (ref 150–450)
RBC: 4.13 x10E6/uL (ref 3.77–5.28)
RDW: 14.3 % (ref 11.7–15.4)
WBC: 7 10*3/uL (ref 3.4–10.8)

## 2022-05-02 LAB — URINE CULTURE

## 2022-05-23 ENCOUNTER — Emergency Department (HOSPITAL_COMMUNITY): Payer: Self-pay

## 2022-05-23 ENCOUNTER — Other Ambulatory Visit: Payer: Self-pay

## 2022-05-23 ENCOUNTER — Emergency Department (HOSPITAL_COMMUNITY)
Admission: EM | Admit: 2022-05-23 | Discharge: 2022-05-24 | Disposition: A | Discharge status: Home / Self Care | Payer: Self-pay | Attending: Emergency Medicine | Admitting: Emergency Medicine

## 2022-05-23 ENCOUNTER — Encounter (HOSPITAL_COMMUNITY): Payer: Self-pay

## 2022-05-23 DIAGNOSIS — N63 Unspecified lump in unspecified breast: Secondary | ICD-10-CM

## 2022-05-23 DIAGNOSIS — N6310 Unspecified lump in the right breast, unspecified quadrant: Secondary | ICD-10-CM | POA: Insufficient documentation

## 2022-05-23 DIAGNOSIS — J45909 Unspecified asthma, uncomplicated: Secondary | ICD-10-CM | POA: Insufficient documentation

## 2022-05-23 DIAGNOSIS — J029 Acute pharyngitis, unspecified: Secondary | ICD-10-CM | POA: Insufficient documentation

## 2022-05-23 DIAGNOSIS — E041 Nontoxic single thyroid nodule: Secondary | ICD-10-CM

## 2022-05-23 DIAGNOSIS — R519 Headache, unspecified: Secondary | ICD-10-CM | POA: Insufficient documentation

## 2022-05-23 LAB — CBC WITH DIFFERENTIAL/PLATELET
Abs Immature Granulocytes: 0.02 10*3/uL (ref 0.00–0.07)
Basophils Absolute: 0.1 10*3/uL (ref 0.0–0.1)
Basophils Relative: 1 %
Eosinophils Absolute: 0.1 10*3/uL (ref 0.0–0.5)
Eosinophils Relative: 2 %
HCT: 40.1 % (ref 36.0–46.0)
Hemoglobin: 13.1 g/dL (ref 12.0–15.0)
Immature Granulocytes: 0 %
Lymphocytes Relative: 28 %
Lymphs Abs: 2 10*3/uL (ref 0.7–4.0)
MCH: 28.7 pg (ref 26.0–34.0)
MCHC: 32.7 g/dL (ref 30.0–36.0)
MCV: 87.7 fL (ref 80.0–100.0)
Monocytes Absolute: 0.5 10*3/uL (ref 0.1–1.0)
Monocytes Relative: 8 %
Neutro Abs: 4.3 10*3/uL (ref 1.7–7.7)
Neutrophils Relative %: 61 %
Platelets: 265 10*3/uL (ref 150–400)
RBC: 4.57 MIL/uL (ref 3.87–5.11)
RDW: 14.1 % (ref 11.5–15.5)
WBC: 7 10*3/uL (ref 4.0–10.5)
nRBC: 0 % (ref 0.0–0.2)

## 2022-05-23 LAB — BASIC METABOLIC PANEL
Anion gap: 5 (ref 5–15)
BUN: 12 mg/dL (ref 6–20)
CO2: 29 mmol/L (ref 22–32)
Calcium: 8.6 mg/dL — ABNORMAL LOW (ref 8.9–10.3)
Chloride: 105 mmol/L (ref 98–111)
Creatinine, Ser: 0.66 mg/dL (ref 0.44–1.00)
GFR, Estimated: >60 mL/min (ref >60)
Glucose, Bld: 89 mg/dL (ref 70–99)
Potassium: 3.9 mmol/L (ref 3.5–5.1)
Sodium: 139 mmol/L (ref 135–145)

## 2022-05-23 LAB — HCG, QUANTITATIVE, PREGNANCY: hCG, Beta Chain, Quant, S: <1 m[IU]/mL (ref <5)

## 2022-05-23 LAB — TSH: TSH: 0.01 u[IU]/mL — ABNORMAL LOW (ref 0.350–4.500)

## 2022-05-23 LAB — MONONUCLEOSIS SCREEN: Mono Screen: NEGATIVE

## 2022-05-23 MED ORDER — IOHEXOL 300 MG/ML  SOLN
75.0000 mL | Freq: Once | INTRAMUSCULAR | Status: AC | PRN
  Administered 2022-05-23: 75 mL via INTRAVENOUS

## 2022-05-23 NOTE — Discharge Instructions (Addendum)
Call Dr. Fransico Him who is our local endocrinologist for further evaluation and management of your thyroid nodule and suspected thyroid disease.  Additional tests have been ordered to further assess Dr. Fransico Him with your evaluation, these tests are pending at this time.  Also as discussed, you do need further evaluation of the nodule in your right breast.  Please call [facility] to arrange an office visit for further evaluation of this they can order the testing that you need.

## 2022-05-23 NOTE — ED Triage Notes (Signed)
Pt to ED via POV c/o sore throat and headache x 1 month, evaluated at urgent care 2 weeks ago for the same , tested negative for COVID, FLU, Strep, prescribed antibiotics at the time and took them. Pt also  Requesting removal of birth control implant in left arm.

## 2022-05-23 NOTE — ED Provider Notes (Signed)
[facility] EMERGENCY DEPARTMENT Provider Note   CSN: [id number] Arrival date & time: 05/23/22  1519     History  Chief Complaint  Patient presents with   Sore Throat   Headache    Bianca Hayes is a 31 y.o. female with a history of asthma, anemia and GERD presenting with an approximate 2-week history of sore throat and headache for the about the last month, she describes sinus type headache, reporting aching in her face and forehead but denies nasal congestion or rhinorrhea.  She has a significant sore throat with swallowing, also endorses generalized fatigue, stating she can sleep all night and feel like she has not rested at all.  She also reports a full sensation in her neck and throat, describing "golf ball sensation when she attempts to swallow.  She denies difficulty eating or drinking aside from the pain.  She was seen at our urgent care center for the symptoms, both at the end of June and then again on July 3, she was placed on a course of Zithromax, was also prescribed Zofran as at the time she was also having nausea vomiting and diarrhea all the symptoms have resolved.  She was also screened for COVID strep and flu and these tests were negative.  She has found no alleviators for her symptoms.  She denies fevers or chills.  Additionally she reports finding a tender lump in her right breast several months ago.  She has no family history of breast cancer, denies any prior similar findings, no discharge, redness, unilateral swelling.  She has been trying to establish care with the health department but has been hitting roadblocks regarding getting medical records from her prior provider so has been unable to establish care there. She is also desirous of having her birth control implant removed which has expired.    The history is provided by the patient.       Home Medications Prior to Admission medications   Medication Sig Start Date End Date Taking? Authorizing Provider   acetaminophen (TYLENOL) 325 MG tablet Take 2 tablets (650 mg total) by mouth every 6 (six) hours as needed. Patient taking differently: Take 650 mg by mouth every 6 (six) hours as needed for mild pain. 02/18/20   Carrie Mew, MD  azithromycin (ZITHROMAX) 250 MG tablet Take first 2 tablets together, then 1 every day until finished. 04/24/22   Volney American, PA-C  diphenhydrAMINE (BENADRYL) 25 MG tablet Take 1 tablet (25 mg total) by mouth every 8 (eight) hours for 3 days. 12/11/21 12/14/21  Godfrey Pick, MD  diphenoxylate-atropine (LOMOTIL) 2.5-0.025 MG tablet Take 1-2 tablets by mouth 4 (four) times daily as needed for diarrhea or loose stools. Patient not taking: Reported on 09/01/2021 05/29/21   Scot Jun, FNP  etonogestrel (NEXPLANON) 68 MG IMPL implant 65 mg by Subdermal route once. 03/21/16   [provider]  ibuprofen (ADVIL) 800 MG tablet Take 1 tablet (800 mg total) by mouth 3 (three) times daily. 04/30/22   Leath-Warren, Alda Lea, NP  medroxyPROGESTERone (PROVERA) 10 MG tablet Take 2 tablets (20 mg total) by mouth daily. Patient not taking: Reported on 01/04/2021 02/18/20   Carrie Mew, MD  naproxen (NAPROSYN) 500 MG tablet Take 1 tablet (500 mg total) by mouth 2 (two) times daily with a meal. Patient not taking: Reported on 01/04/2021 02/18/20   Carrie Mew, MD  omeprazole (PRILOSEC) 20 MG capsule Take 20 mg by mouth daily.    [provider]  ondansetron (ZOFRAN) 4 MG tablet Take 1 tablet (4 mg total) by mouth every 6 (six) hours. 04/24/22   Volney American, PA-C  ondansetron (ZOFRAN-ODT) 4 MG disintegrating tablet Take 1 tablet (4 mg total) by mouth every 8 (eight) hours as needed for nausea or vomiting. 04/30/22   Leath-Warren, Alda Lea, NP  pantoprazole (PROTONIX) 40 MG tablet Take 1 tablet (40 mg total) by mouth daily. Patient not taking: Reported on 12/11/2021 03/29/18 03/29/19  Earleen Newport, MD      Allergies    Coconut flavor,  Iodine, Bactrim [sulfamethoxazole-trimethoprim], and Penicillins    Review of Systems   Review of Systems  Constitutional:  Positive for fatigue. Negative for chills and fever.  HENT:  Positive for sinus pain and trouble swallowing. Negative for congestion, sore throat and voice change.   Eyes: Negative.   Respiratory:  Negative for chest tightness and shortness of breath.   Cardiovascular:  Negative for chest pain.  Gastrointestinal:  Negative for abdominal pain, nausea and vomiting.  Genitourinary: Negative.   Musculoskeletal:  Negative for arthralgias, joint swelling and neck pain.  Skin: Negative.  Negative for rash and wound.  Neurological:  Positive for headaches. Negative for dizziness, weakness, light-headedness and numbness.  Hematological:  Negative for adenopathy.  Psychiatric/Behavioral: Negative.    All other systems reviewed and are negative.   Physical Exam Updated Vital Signs BP 127/77   Pulse 86   Temp 98.6 F (37 C)   Resp 18   Ht 5' 9"  (1.753 m)   Wt (!) 154.2 kg   LMP 05/20/2022 (Approximate)   SpO2 100%   BMI 50.21 kg/m  Physical Exam Vitals and nursing note reviewed.  Constitutional:      Appearance: She is well-developed.  HENT:     Head: Normocephalic and atraumatic.     Salivary Glands: Right salivary gland is not diffusely enlarged. Left salivary gland is not diffusely enlarged.     Comments: There is a fullness right anterior neck without discrete mass.     Nose: No congestion.     Mouth/Throat:     Mouth: Mucous membranes are dry.     Pharynx: Uvula midline. Posterior oropharyngeal erythema present.     Tonsils: No tonsillar exudate or tonsillar abscesses.     Comments: Mild posterior erythema, no exudate. Eyes:     Conjunctiva/sclera: Conjunctivae normal.  Cardiovascular:     Rate and Rhythm: Normal rate and regular rhythm.     Heart sounds: Normal heart sounds.  Pulmonary:     Effort: Pulmonary effort is normal.     Breath sounds:  Normal breath sounds. No wheezing.  Chest:       Comments: Mildly tender, freely mobile mass, approx 2 cm right breast/axilla around the 11oclock position.  Not fixed. No erythema or other skin changes, favoring lipoma or cyst.   Abdominal:     General: Bowel sounds are normal.     Palpations: Abdomen is soft.     Tenderness: There is no abdominal tenderness.  Musculoskeletal:        General: Normal range of motion.     Cervical back: Normal range of motion.  Skin:    General: Skin is warm and dry.  Neurological:     Mental Status: She is alert.     ED Results / Procedures / Treatments   Labs (all labs ordered are listed, but only abnormal results are displayed) Labs Reviewed  TSH - Abnormal; Notable for the following components:  Result Value   TSH 0.010 (*)    All other components within normal limits  BASIC METABOLIC PANEL - Abnormal; Notable for the following components:   Calcium 8.6 (*)    All other components within normal limits  MONONUCLEOSIS SCREEN  CBC WITH DIFFERENTIAL/PLATELET  HCG, QUANTITATIVE, PREGNANCY  T3, FREE  T4, FREE  T4  T3    EKG None  Radiology CT Soft Tissue Neck W Contrast  Result Date: 05/23/2022 CLINICAL DATA:  Initial evaluation for soft tissue swelling, infection suspected. EXAM: CT NECK WITH CONTRAST TECHNIQUE: Multidetector CT imaging of the neck was performed using the standard protocol following the bolus administration of intravenous contrast. RADIATION DOSE REDUCTION: This exam was performed according to the departmental dose-optimization program which includes automated exposure control, adjustment of the mA and/or kV according to patient size and/or use of iterative reconstruction technique. CONTRAST:  2m OMNIPAQUE IOHEXOL 300 MG/ML  SOLN COMPARISON:  Prior radiograph from earlier the same day. FINDINGS: Pharynx and larynx: Oral cavity within normal limits. Oropharynx and nasopharynx within normal limits. No retropharyngeal  collection or swelling. Negative epiglottis. Hypopharynx and supraglottic larynx within normal limits. Glottis within normal limits. Subglottic airway deviated to the left but remains patent. Salivary glands: Salivary glands including the parotid glands are within normal limits. Submandibular glands are either hypoplastic or absent, neither which is well visualized. Thyroid: Heterogeneous predominantly hypodense mass positioned along the lateral margin of the right lobe of thyroid measures approximately 4.5 x 3.1 x 5.5 cm (series 2, image 84). This is favored to reflect an enlarged heterogeneous thyroid nodule. Mild mass effect on the adjacent trachea which is deviated to the left but remains patent. Contralateral left thyroid lobe within normal limits. Lymph nodes: Mildly prominent level 2 lymph nodes measure at the upper limits of normal at 1 cm in short axis. No overtly enlarged or pathologic adenopathy within the neck. Vascular: Normal intravascular enhancement seen throughout the neck. Right CCA and right IJ are deviated laterally by the large right thyroid nodule. Limited intracranial: Unremarkable. Visualized orbits: Unremarkable. Mastoids and visualized paranasal sinuses: Visualized paranasal sinuses are clear. Visualized mastoids and middle ear cavities are well pneumatized and free of fluid. Skeleton: No discrete or worrisome osseous lesions. Upper chest: Unremarkable. Other: None. IMPRESSION: 1. 4.5 x 3.1 x 5.5 cm heterogeneous mass positioned along the lateral margin of the right lobe of thyroid, favored to reflect an enlarged heterogeneous thyroid nodule. Further evaluation with dedicated thyroid ultrasound recommended (ref: J Am Coll Radiol. 2015 Feb;12(2): 143-50). 2. No other acute abnormality within the neck. Electronically Signed   By: BJeannine BogaM.D.   On: 05/23/2022 23:08   DG Neck Soft Tissue  Result Date: 05/23/2022 CLINICAL DATA:  Throat swelling and possible mass. EXAM: NECK  SOFT TISSUES - 1+ VIEW COMPARISON:  None Available. FINDINGS: There is no evidence of retropharyngeal soft tissue swelling or epiglottic enlargement. No radio-opaque foreign body identified. There does appear to be deviation of the trachea to the left. IMPRESSION: Mild tracheal deviation to the left is noted. CT scan of the soft tissues of neck is recommended evaluate for possible mass. Electronically Signed   By: JMarijo ConceptionM.D.   On: 05/23/2022 20:54    Procedures Procedures    Medications Ordered in ED Medications  iohexol (OMNIPAQUE) 300 MG/ML solution 75 mL (75 mLs Intravenous Contrast Given 05/23/22 2219)    ED Course/ Medical Decision Making/ A&P Clinical Course as of 05/23/22 2351  Wed May 23, 2022  2311 MCHC: 32.7 [JI]    Clinical Course User Index [JI] Evalee Jefferson, PA-C                           Medical Decision Making Patient with a 1 month history of fatigue, headache and globus sensation and sore throat, she has tested negative for strep, COVID and flu at recent urgent care visit.  Mono mono could certainly present with similar symptoms, especially sore throat and profound fatigue.  With the perceived globus sensation, soft tissue neck was obtained to rule out pharyngeal mass or abscess, concern also raised for epiglottitis, although she has no voice changes, no respiratory distress.  There is a fullness in her right neck, concern also for possible goiter or thyroid mass.  Her plain films did suggest a mild left tracheal shift suggesting possible mass, CT neck was completed and positive for a significant thyroid nodule along the lateral margin of the right lobe of the thyroid.  She also has a significantly depressed TSH level at 0.  010, suggesting hyperthyroidism, although her symptoms suggest more of a hypothyroid picture.  She will need further evaluation by endocrinology and was referred to Dr. Dorris Fetch for further work-up.  A total and free T3 and T4 have been added to her lab  work.  Additionally she does have a mass in her right breast, I am less suspicious that this represents a malignancy, although she does need to have further evaluation of this, [facility] and management and appropriate imaging test.  They should probably be able to help her with removal of her birth control implant as well.  Amount and/or Complexity of Data Reviewed Labs: ordered.    Details: Negative mono, be met normal except for hypocalcemia at 8.6.  Her CBC is normal with no leukocytosis.  TSH is significantly depressed at 0.010 Radiology: ordered.    Details: Soft tissue neck showing mild tracheal deviation raising concern of possible right-sided mass.  This was followed up with CT imaging revealing a 4.5 x 3.1 x 5.5 mass along the right thyroid lobe favoring thyroid nodule.  An ultrasound for further evaluation is recommended.  Ultrasound is not available here this evening.  She has been referred to endocrinologist Dr. Dorris Fetch for further evaluation and management of this condition.  She has no symptoms of thyroid storm, vital signs are stable.  Risk Diagnosis or treatment significantly limited by social determinants of health. Risk Details: Possible concern with patient being able to get specialized follow-up care given her lack of medical insurance except for her women's Medicaid.  She should have no problem getting follow-up care with [facility], however endocrinology may be more concerning.  She was given information about the [facility] financial assistance program which she will pursue.           Final Clinical Impression(s) / ED Diagnoses Final diagnoses:  Thyroid nodule  Subcutaneous nodule of breast    Rx / DC Orders ED Discharge Orders     None         Landis Martins 05/23/22 2357    Fredia Sorrow, MD 05/29/22 [phone]

## 2022-05-23 NOTE — ED Notes (Signed)
Patient has large mass noted to right side of throat, tender to touch. Palpated quarter-size nodule to right breast.

## 2022-05-24 LAB — T4, FREE: Free T4: 1.05 ng/dL (ref 0.61–1.12)

## 2022-05-25 ENCOUNTER — Encounter: Payer: Self-pay | Admitting: Emergency Medicine

## 2022-05-25 ENCOUNTER — Ambulatory Visit
Admission: EM | Admit: 2022-05-25 | Discharge: 2022-05-25 | Disposition: A | Discharge status: Home / Self Care | Payer: Medicaid Other | Attending: Family Medicine | Admitting: Family Medicine

## 2022-05-25 DIAGNOSIS — R110 Nausea: Secondary | ICD-10-CM

## 2022-05-25 DIAGNOSIS — E079 Disorder of thyroid, unspecified: Secondary | ICD-10-CM

## 2022-05-25 DIAGNOSIS — J029 Acute pharyngitis, unspecified: Secondary | ICD-10-CM

## 2022-05-25 LAB — T4: T4, Total: 10.9 ug/dL (ref 4.5–12.0)

## 2022-05-25 LAB — T3, FREE: T3, Free: 4.8 pg/mL — ABNORMAL HIGH (ref 2.0–4.4)

## 2022-05-25 LAB — T3: T3, Total: 242 ng/dL — ABNORMAL HIGH (ref 71–180)

## 2022-05-25 MED ORDER — KETOROLAC TROMETHAMINE 30 MG/ML IJ SOLN
30.0000 mg | Freq: Once | INTRAMUSCULAR | Status: AC
  Administered 2022-05-25: 30 mg via INTRAMUSCULAR

## 2022-05-25 NOTE — ED Triage Notes (Signed)
Was seen in the ED on Wednesday for same symptoms.  States throat is sore.  States they found a mass on thyroid and right breast.  States she is nauseated and having diarrhea.   Has not filled RX for zofran due to the cost.

## 2022-05-25 NOTE — Discharge Instructions (Signed)
If you go to the [facility] health website, you may click on the find a provider tab and get information on who is taking new patients for primary care.  You can even schedule your appointment online.  I highly recommend after finding the clinic that you want to attend giving them a call to let them know what is going on and how urgent your needs are.  Hopefully you will be able to be seen by someone in the next few weeks to start coordinating your care and interventions.

## 2022-05-28 ENCOUNTER — Telehealth: Payer: Self-pay | Admitting: Nurse Practitioner

## 2022-05-28 NOTE — Telephone Encounter (Signed)
Can you review this chart for a referral from [facility]. It is for a thyroid mass. Can this wait until the next appt which is end of sept or do you want to overbook?   [phone]

## 2022-05-28 NOTE — Telephone Encounter (Signed)
Looks like she needs to be seen a bit sooner.  Maybe in the next 2 weeks or so?

## 2022-05-30 NOTE — ED Provider Notes (Signed)
[facility]    CSN: [id number] Arrival date & time: 05/25/22  1914      History   Chief Complaint No chief complaint on file.   HPI Bianca Hayes is a 31 y.o. female.   Presenting today following up on emergency department visit from 2 days ago for sore throat, thyroid mass found on imaging in the emergency department, nausea, diarrhea.  She states she was referred to an endocrinologist from the emergency department but has not yet been able to call to schedule this appointment.  She was given Zofran but she did not pick it up due to cost.  She states her nausea has improved, just some lingering diarrhea.  She needs a work note extension as she does not feel ready to go back to work.  Denies chest pain, shortness of breath, difficulty breathing or swallowing, fever, chills, severe abdominal pain.  Tolerating p.o. well.  Not currently taking anything over-the-counter for symptoms.    Past Medical History:  Diagnosis Date   Anemia    Asthma    GERD (gastroesophageal reflux disease)    H/O cervical fracture    Ovarian cyst    Skull fracture (HCC)     There are no problems to display for this patient.   Past Surgical History:  Procedure Laterality Date   DENTAL SURGERY      OB History   No obstetric history on file.      Home Medications    Prior to Admission medications   Medication Sig Start Date End Date Taking? Authorizing Provider  acetaminophen (TYLENOL) 325 MG tablet Take 2 tablets (650 mg total) by mouth every 6 (six) hours as needed. Patient taking differently: Take 650 mg by mouth every 6 (six) hours as needed for mild pain. 02/18/20   Sharman Cheek, MD  azithromycin (ZITHROMAX) 250 MG tablet Take first 2 tablets together, then 1 every day until finished. 04/24/22   Particia Nearing, PA-C  diphenhydrAMINE (BENADRYL) 25 MG tablet Take 1 tablet (25 mg total) by mouth every 8 (eight) hours for 3 days. 12/11/21 12/14/21  Gloris Manchester, MD   diphenoxylate-atropine (LOMOTIL) 2.5-0.025 MG tablet Take 1-2 tablets by mouth 4 (four) times daily as needed for diarrhea or loose stools. Patient not taking: Reported on 09/01/2021 05/29/21   Bing Neighbors, FNP  etonogestrel (NEXPLANON) 68 MG IMPL implant 65 mg by Subdermal route once. 03/21/16   [provider]  ibuprofen (ADVIL) 800 MG tablet Take 1 tablet (800 mg total) by mouth 3 (three) times daily. 04/30/22   Leath-Warren, Sadie Haber, NP  medroxyPROGESTERone (PROVERA) 10 MG tablet Take 2 tablets (20 mg total) by mouth daily. Patient not taking: Reported on 01/04/2021 02/18/20   Sharman Cheek, MD  naproxen (NAPROSYN) 500 MG tablet Take 1 tablet (500 mg total) by mouth 2 (two) times daily with a meal. Patient not taking: Reported on 01/04/2021 02/18/20   Sharman Cheek, MD  omeprazole (PRILOSEC) 20 MG capsule Take 20 mg by mouth daily.    [provider]  ondansetron (ZOFRAN) 4 MG tablet Take 1 tablet (4 mg total) by mouth every 6 (six) hours. 04/24/22   Particia Nearing, PA-C  ondansetron (ZOFRAN-ODT) 4 MG disintegrating tablet Take 1 tablet (4 mg total) by mouth every 8 (eight) hours as needed for nausea or vomiting. 04/30/22   Leath-Warren, Sadie Haber, NP  pantoprazole (PROTONIX) 40 MG tablet Take 1 tablet (40 mg total) by mouth daily. Patient not taking: Reported on  12/11/2021 03/29/18 03/29/19  Emily Filbert, MD    Family History Family History  Problem Relation Age of Onset   Hypertension Mother    Congestive Heart Failure Mother    COPD Mother    Hypertension Father    Immunodeficiency Father     Social History Social History   Tobacco Use   Smoking status: Never   Smokeless tobacco: Never  Vaping Use   Vaping Use: Never used  Substance Use Topics   Alcohol use: No   Drug use: No     Allergies   Coconut flavor, Iodine, Bactrim [sulfamethoxazole-trimethoprim], and Penicillins   Review of Systems Review of Systems Per HPI  Physical  Exam Triage Vital Signs ED Triage Vitals  Enc Vitals Group     BP 05/25/22 1920 (!) 147/94     Pulse Rate 05/25/22 1920 (!) 102     Resp 05/25/22 1920 18     Temp 05/25/22 1920 98.4 F (36.9 C)     Temp Source 05/25/22 1920 Oral     SpO2 05/25/22 1920 98 %     Weight --      Height --      Head Circumference --      Peak Flow --      Pain Score 05/25/22 1922 10     Pain Loc --      Pain Edu? --      Excl. in GC? --    No data found.  Updated Vital Signs BP (!) 147/94 (BP Location: Right Arm)   Pulse (!) 102   Temp 98.4 F (36.9 C) (Oral)   Resp 18   LMP 05/20/2022 (Approximate)   SpO2 98%   Visual Acuity Right Eye Distance:   Left Eye Distance:   Bilateral Distance:    Right Eye Near:   Left Eye Near:    Bilateral Near:     Physical Exam Vitals and nursing note reviewed.  Constitutional:      Appearance: Normal appearance. She is not ill-appearing.  HENT:     Head: Atraumatic.     Mouth/Throat:     Mouth: Mucous membranes are moist.  Eyes:     Extraocular Movements: Extraocular movements intact.     Conjunctiva/sclera: Conjunctivae normal.  Neck:     Comments: Large palpable mass right aspect of thyroid Cardiovascular:     Rate and Rhythm: Normal rate and regular rhythm.     Heart sounds: Normal heart sounds.  Pulmonary:     Effort: Pulmonary effort is normal.     Breath sounds: Normal breath sounds.  Abdominal:     General: Bowel sounds are normal. There is no distension.     Palpations: Abdomen is soft.     Tenderness: There is no abdominal tenderness. There is no guarding.  Musculoskeletal:        General: Normal range of motion.     Cervical back: Normal range of motion and neck supple.  Skin:    General: Skin is warm and dry.  Neurological:     Mental Status: She is alert and oriented to person, place, and time.  Psychiatric:        Mood and Affect: Mood normal.        Thought Content: Thought content normal.        Judgment: Judgment  normal.      UC Treatments / Results  Labs (all labs ordered are listed, but only abnormal results are displayed) Labs Reviewed -  No data to display  EKG   Radiology No results found.  Procedures Procedures (including critical care time)  Medications Ordered in UC Medications  ketorolac (TORADOL) 30 MG/ML injection 30 mg (30 mg Intramuscular Given 05/25/22 1945)    Initial Impression / Assessment and Plan / UC Course  I have reviewed the triage vital signs and the nursing notes.  Pertinent labs & imaging results that were available during my care of the patient were reviewed by me and considered in my medical decision making (see chart for details).     IM Toradol given in clinic for her complaints of throat pain as she states she is unable to tolerate any more p.o. pain relievers as they are causing nausea and upper abdominal pain.  Discussed picking up the Zofran for any remaining nausea, brat diet, push fluids.  Set up with a primary care provider and call the endocrinology office to schedule appointment.  ED for worsening symptoms anytime.  Work note extended.  Final Clinical Impressions(s) / UC Diagnoses   Final diagnoses:  Thyroid mass  Nausea without vomiting  Sore throat     Discharge Instructions      If you go to the [facility] health website, you may click on the find a provider tab and get information on who is taking new patients for primary care.  You can even schedule your appointment online.  I highly recommend after finding the clinic that you want to attend giving them a call to let them know what is going on and how urgent your needs are.  Hopefully you will be able to be seen by someone in the next few weeks to start coordinating your care and interventions.    ED Prescriptions   None    PDMP not reviewed this encounter.   Particia Nearing, [place] 05/30/22 1407

## 2022-06-11 ENCOUNTER — Ambulatory Visit (INDEPENDENT_AMBULATORY_CARE_PROVIDER_SITE_OTHER): Payer: Medicaid Other | Admitting: Nurse Practitioner

## 2022-06-11 ENCOUNTER — Encounter: Payer: Self-pay | Admitting: Nurse Practitioner

## 2022-06-11 VITALS — BP 147/92 | HR 99 | Ht 70.0 in | Wt 364.0 lb

## 2022-06-11 DIAGNOSIS — R7989 Other specified abnormal findings of blood chemistry: Secondary | ICD-10-CM | POA: Diagnosis not present

## 2022-06-11 DIAGNOSIS — E049 Nontoxic goiter, unspecified: Secondary | ICD-10-CM

## 2022-06-11 DIAGNOSIS — E041 Nontoxic single thyroid nodule: Secondary | ICD-10-CM

## 2022-06-11 MED ORDER — PREDNISONE 10 MG PO TABS: 10.0000 mg | ORAL_TABLET | Freq: Every day | ORAL | 0 refills | Status: DC

## 2022-06-11 NOTE — Patient Instructions (Signed)
Goiter  A goiter is an enlarged thyroid gland. The thyroid gland is located in the lower front part of the neck, just in front of the windpipe (trachea). This gland makes hormones that affect how the body processes food for energy (metabolism) and how the heart and brain function. Most goiters are painless and are not a cause for concern. Some goiters can affect the way your thyroid makes thyroid hormones. Goiters and conditions that cause goiters can be treated, if necessary. What are the causes? This condition may be caused by: Lack of a mineral called iodine. The thyroid gland uses iodine to make thyroid hormones. Diseases that attack healthy cells in the body (autoimmune diseases) and affect thyroid function, such as Graves' disease or Hashimoto's disease. These diseases may cause the body to produce too much thyroid hormone (hyperthyroidism) or too little of the hormone (hypothyroidism). Conditions that cause inflammation of the thyroid (thyroiditis). One or more small growths on the thyroid (nodular goiter). Other causes may include: Medical problems caused by abnormal genes that are passed from parent to child (genetic defects). Thyroid injury or infection. Tumors that may or may not be cancerous. Pregnancy. Certain medicines. Exposure to radiation. In some cases, the cause may not be known. What increases the risk? The following factors may make you more likely to develop this condition: You do not get enough iodine in your diet. You have a family history of goiter. You are female. You are older than age 40. You smoke tobacco. You have had exposure to radiation. What are the signs or symptoms? The main symptom of this condition is swelling in the lower, front part of the neck. This swelling can range from a very small bump to a large lump. Other symptoms may include: A tight feeling in the throat. A hoarse voice. Coughing. Wheezing. Difficulty swallowing or breathing. Bulging  veins in the neck. Dizziness. When a goiter is the result of an overactive thyroid (hyperthyroidism), symptoms may also include: Nervousness or restlessness. Inability to tolerate heat. Unexplained weight loss. Diarrhea. Changes in heartbeat, such as skipped beats, extra beats, or a rapid heart rate. Loss of menstruation. Increased appetite. Sleep problems. When a goiter is the result of an underactive thyroid (hypothyroidism), symptoms may also include: Feeling tired (fatigue). Inability to tolerate cold. Weight gain that is not explained by a change in diet or exercise habits. Dry skin or coarse hair. Irregular menstrual periods. Constipation. Sadness or depression. In some cases, there may not be any symptoms. How is this diagnosed? This condition may be diagnosed based on your symptoms, your medical history, and a physical exam. You may have tests, such as: Blood tests to check thyroid function. Imaging tests, such as: Ultrasound. CT scan. MRI. Thyroid scan. Removal of a tissue sample (biopsy) of the goiter or any nodules. The sample will be tested to check for cancer. How is this treated? Treatment for this condition depends on the cause and your symptoms. Treatment may include: Medicines to regulate thyroid hormone levels. Anti-inflammatory medicines or steroid medicines, if the goiter is caused by inflammation. Iodine supplements or changes to your diet, if the goiter is caused by iodine deficiency. Radioactive iodine treatment. Surgery to remove your thyroid. In some cases, you may only need regular check-ups with your health care provider to monitor your condition, and you may not need treatment. Follow these instructions at home: Follow instructions from your health care provider about any changes to your diet. Take over-the-counter and prescription medicines only as told   by your health care provider. These include supplements. Do not use any products that contain  nicotine or tobacco. These products include cigarettes, chewing tobacco, and vaping devices, such as e-cigarettes. If you need help quitting, ask your health care provider. Keep all follow-up visits. Your health care provider will want to repeat blood tests to check thyroid function. Where to find more information American Thyroid Association: thyroid.org Endocrine Society: endocrine.org Contact a health care provider if: Your symptoms do not get better with treatment. You have nausea, vomiting, or diarrhea. You have a fever. You suddenly become very weak. You experience extreme restlessness. Get help right away if: You have sudden, unexplained confusion or other mental changes. You have chest pain. You have trouble breathing or swallowing. You have fast or irregular heartbeats (palpitations). These symptoms may be an emergency. Get help right away. Call 911. Do not wait to see if the symptoms will go away. Do not drive yourself to the hospital. Summary A goiter is an enlarged thyroid gland. The thyroid gland is located in the lower front part of the neck, just in front of the windpipe. The main symptom of this condition is swelling in the lower, front part of the neck. This swelling can range from a very small bump to a large lump. Treatment for this condition depends on the cause and your symptoms. You may need medicines, supplements, or regular monitoring of your condition. This information is not intended to replace advice given to you by your health care provider. Make sure you discuss any questions you have with your health care provider. Document Revised: 12/08/2021 Document Reviewed: 12/08/2021 Elsevier Patient Education  2023 Elsevier Inc.  

## 2022-06-11 NOTE — Progress Notes (Signed)
Endocrinology Consult Note 06/11/22    ---------------------------------------------------------------------------------------------------------------------- Subjective    Past Medical History:  Diagnosis Date   Anemia    Asthma    GERD (gastroesophageal reflux disease)    H/O cervical fracture    Ovarian cyst    Skull fracture (HCC)     Past Surgical History:  Procedure Laterality Date   DENTAL SURGERY      Social History   Socioeconomic History   Marital status: Divorced    Spouse name: Not on file   Number of children: Not on file   Years of education: Not on file   Highest education level: Not on file  Occupational History   Not on file  Tobacco Use   Smoking status: Never   Smokeless tobacco: Never  Vaping Use   Vaping Use: Never used  Substance and Sexual Activity   Alcohol use: No   Drug use: No   Sexual activity: Yes    Birth control/protection: None    Comment: has expired Nexplanon in place  Other Topics Concern   Not on file  Social History Narrative   Not on file   Social Determinants of Health   Financial Resource Strain: Not on file  Food Insecurity: Not on file  Transportation Needs: Not on file  Physical Activity: Not on file  Stress: Not on file  Social Connections: Not on file  Intimate Partner Violence: Not on file    Current Outpatient Medications on File Prior to Visit  Medication Sig Dispense Refill   omeprazole (PRILOSEC) 20 MG capsule Take 20 mg by mouth daily as needed.     acetaminophen (TYLENOL) 325 MG tablet Take 2 tablets (650 mg total) by mouth every 6 (six) hours as needed. (Patient taking differently: Take 650 mg by mouth every 6 (six) hours as needed for mild pain.) 60 tablet 0   etonogestrel (NEXPLANON) 68 MG IMPL implant 65 mg by Subdermal route once.     ibuprofen (ADVIL) 800 MG tablet Take 1 tablet (800 mg total) by mouth 3 (three) times daily. 30 tablet 0   ondansetron (ZOFRAN-ODT) 4 MG disintegrating tablet Take 1  tablet (4 mg total) by mouth every 8 (eight) hours as needed for nausea or vomiting. (Patient not taking: Reported on 06/11/2022) 20 tablet 0   No current facility-administered medications on file prior to visit.      HPI   Bianca Hayes is a 31 y.o.-year-old female, referred by the hospitalist, for evaluation for thyroid nodule found after CT neck with contrast during recent hospitalization.  They originally thought it was oral infection but that was ruled out.    I reviewed pt's thyroid tests: Lab Results  Component Value Date   TSH 0.010 (L) 05/23/2022   FREET4 1.05 05/23/2022     Pt complains of: - feeling nodules in neck - hoarseness - dysphagia - choking - SOB with lying down - anxiety - palpitations - nausea  Pt denies: - fatigue - heat intolerance/cold intolerance - tremors - dry skin - hair loss   She does have family history of hypothyroidism in her mother.  No FH of thyroid cancer. No h/o radiation tx to head or neck.  No seaweed or kelp. No recent contrast studies. No steroid use. No herbal supplements. No Biotin supplements or Hair, Skin and Nails vitamins.    Review of systems  Constitutional: + Minimally fluctuating body weight,  current Body mass index is 52.23 kg/m. , no fatigue, no subjective hyperthermia, no  subjective hypothermia Eyes: no blurry vision, no xerophthalmia ENT: + sore throat, + nodules palpated in throat, + dysphagia/odynophagia, + hoarseness Cardiovascular: no chest pain, no shortness of breath, + palpitations, no leg swelling Respiratory: no cough, no shortness of breath Gastrointestinal: no vomiting/diarrhea, + nausea Musculoskeletal: no muscle/joint aches Skin: no rashes, no hyperemia Neurological: no tremors, no numbness, no tingling, no dizziness Psychiatric: no depression, + anxiety  ---------------------------------------------------------------------------------------------------------------------- Objective     BP (!) 147/92   Pulse 99   Ht 5\' 10"  (1.778 m)   Wt (!) 364 lb (165.1 kg)   LMP 05/20/2022 (Approximate)   BMI 52.23 kg/m    BP Readings from Last 3 Encounters:  06/11/22 (!) 147/92  05/25/22 (!) 147/94  05/24/22 132/80    Wt Readings from Last 3 Encounters:  06/11/22 (!) 364 lb (165.1 kg)  05/23/22 (!) 340 lb (154.2 kg)  12/11/21 (!) 330 lb 0.5 oz (149.7 kg)     Physical Exam- Limited  Constitutional:  Body mass index is 52.23 kg/m. , not in acute distress, normal state of mind Eyes:  EOMI, no exophthalmos Neck: Supple Thyroid: ++ right thyroid mass, tender to palpation Cardiovascular: RRR, no murmurs, rubs, or gallops, no edema Respiratory: Adequate breathing efforts, no crackles, rales, rhonchi, or wheezing Musculoskeletal: no gross deformities, strength intact in all four extremities, no gross restriction of joint movements Skin:  no rashes, no hyperemia Neurological: no tremor with outstretched hands    CT neck with contrast 05/23/22 CLINICAL DATA:  Initial evaluation for soft tissue swelling, infection suspected.   EXAM: CT NECK WITH CONTRAST   TECHNIQUE: Multidetector CT imaging of the neck was performed using the standard protocol following the bolus administration of intravenous contrast.   RADIATION DOSE REDUCTION: This exam was performed according to the departmental dose-optimization program which includes automated exposure control, adjustment of the mA and/or kV according to patient size and/or use of iterative reconstruction technique.   CONTRAST:  58mL OMNIPAQUE IOHEXOL 300 MG/ML  SOLN   COMPARISON:  Prior radiograph from earlier the same day.   FINDINGS: Pharynx and larynx: Oral cavity within normal limits. Oropharynx and nasopharynx within normal limits. No retropharyngeal collection or swelling. Negative epiglottis. Hypopharynx and supraglottic larynx within normal limits. Glottis within normal limits. Subglottic airway deviated to  the left but remains patent.   Salivary glands: Salivary glands including the parotid glands are within normal limits. Submandibular glands are either hypoplastic or absent, neither which is well visualized.   Thyroid: Heterogeneous predominantly hypodense mass positioned along the lateral margin of the right lobe of thyroid measures approximately 4.5 x 3.1 x 5.5 cm (series 2, image 84). This is favored to reflect an enlarged heterogeneous thyroid nodule. Mild mass effect on the adjacent trachea which is deviated to the left but remains patent. Contralateral left thyroid lobe within normal limits.   Lymph nodes: Mildly prominent level 2 lymph nodes measure at the upper limits of normal at 1 cm in short axis. No overtly enlarged or pathologic adenopathy within the neck.   Vascular: Normal intravascular enhancement seen throughout the neck. Right CCA and right IJ are deviated laterally by the large right thyroid nodule.   Limited intracranial: Unremarkable.   Visualized orbits: Unremarkable.   Mastoids and visualized paranasal sinuses: Visualized paranasal sinuses are clear. Visualized mastoids and middle ear cavities are well pneumatized and free of fluid.   Skeleton: No discrete or worrisome osseous lesions.   Upper chest: Unremarkable.   Other: None.   IMPRESSION: 1.  4.5 x 3.1 x 5.5 cm heterogeneous mass positioned along the lateral margin of the right lobe of thyroid, favored to reflect an enlarged heterogeneous thyroid nodule. Further evaluation with dedicated thyroid ultrasound recommended (ref: J Am Coll Radiol. 2015 Feb;12(2): 143-50). 2. No other acute abnormality within the neck.     Electronically Signed   By: Rise Mu M.D.   On: 05/23/2022 23:08     Latest Reference Range & Units 05/23/22 20:46 05/23/22 23:50  TSH 0.350 - 4.500 uIU/mL 0.010 (L)   Triiodothyronine,Free,Serum 2.0 - 4.4 pg/mL  4.8 (H)  Triiodothyronine (T3) 71 - 180 ng/dL 831  (H)   D1,VOHY(WVPXTG) 0.61 - 1.12 ng/dL  6.26  Thyroxine (T4) 4.5 - 12.0 ug/dL 94.8   (L): Data is abnormally low (H): Data is abnormally high  ----------------------------------------------------------------------------------------------------------------------  ASSESSMENT / PLAN:  1. Goiter 2. ? Hyperthyroidism 3. Thyroid nodule  She was seen in the ED for mass on her right side of her neck and a thyroid nodule was incidentally seen on the CT scan of her neck.  Her TSH was also slightly suppressed at that time and had mildly elevated Free T3.  Will recommend obtaining a dedicated thyroid ultrasound to assess this suspicious spot further and will also repeat thyroid labs, including antibody testing to assess for autoimmune thyroid dysfunction.  She does have some mild neck compression symptoms and may need surgery to correct this.  - I did explain that, while thyroid surgery is not a complicated one, it still can have side effects and also she will mostly likely become hypothyroidism s/p surgery which would require thyroid hormone supplementation for the rest of her life.   Follow Up Plan: Return in about 3 weeks (around 07/02/2022) for Thyroid follow up, Previsit labs, thyroid ultrasound.    I spent 60 minutes in the care of the patient today including review of labs from Thyroid Function, CMP, and other relevant labs ; imaging/biopsy records (current and previous including abstractions from other facilities); face-to-face time discussing  her lab results and symptoms, medications doses, her options of short and long term treatment based on the latest standards of care / guidelines;   and documenting the encounter.  [name]  participated in the discussions, expressed understanding, and voiced agreement with the above plans.  All questions were answered to her satisfaction. she is encouraged to contact clinic should she have any questions or concerns prior to her return  visit.    Ronny Bacon, [facility] [address] [place], [place] [address] Phone: [phone] Fax: [phone]

## 2022-06-12 LAB — THYROID PEROXIDASE ANTIBODY: Thyroperoxidase Ab SerPl-aCnc: 11 IU/mL (ref 0–34)

## 2022-06-12 LAB — THYROGLOBULIN ANTIBODY: Thyroglobulin Antibody: <1 IU/mL (ref 0.0–0.9)

## 2022-06-12 LAB — TSH: TSH: 0.008 u[IU]/mL — ABNORMAL LOW (ref 0.450–4.500)

## 2022-06-12 LAB — T3, FREE: T3, Free: 5.3 pg/mL — ABNORMAL HIGH (ref 2.0–4.4)

## 2022-06-12 LAB — T4, FREE: Free T4: 1.45 ng/dL (ref 0.82–1.77)

## 2022-06-19 ENCOUNTER — Ambulatory Visit (HOSPITAL_COMMUNITY)
Admission: RE | Admit: 2022-06-19 | Discharge: 2022-06-19 | Disposition: A | Discharge status: Home / Self Care | Payer: Medicaid Other | Source: Ambulatory Visit | Attending: Nurse Practitioner | Admitting: Nurse Practitioner

## 2022-06-19 DIAGNOSIS — R7989 Other specified abnormal findings of blood chemistry: Secondary | ICD-10-CM | POA: Insufficient documentation

## 2022-06-19 DIAGNOSIS — E049 Nontoxic goiter, unspecified: Secondary | ICD-10-CM | POA: Insufficient documentation

## 2022-06-19 DIAGNOSIS — E041 Nontoxic single thyroid nodule: Secondary | ICD-10-CM | POA: Insufficient documentation

## 2022-06-20 ENCOUNTER — Telehealth: Payer: Self-pay

## 2022-06-20 NOTE — Addendum Note (Signed)
Addended by: Dani Gobble on: 06/20/2022 11:21 AM   Modules accepted: Orders

## 2022-06-20 NOTE — Telephone Encounter (Signed)
My chart msg sent to pt to notify of results

## 2022-06-20 NOTE — Progress Notes (Signed)
Please call patient and let her know that I have reviewed both the thyroid labs and her ultrasound.  Her labs were suggestive that she may be making too much thyroid hormone but is not too concerning at this time.  Her ultrasound shows 2 rather large nodules one on the left and one on the right (which is where her neck mass was on exam).  Radiologist is recommending biopsy which will let us know if this could potentially be cancerous.  I did go ahead and order this.  [facility] will call her to set it up at her earliest convenience.

## 2022-06-20 NOTE — Telephone Encounter (Signed)
-----   Message from Dani Gobble, NP sent at 06/20/2022 11:23 AM EDT ----- Please call patient and let her know that I have reviewed both the thyroid labs and her ultrasound.  Her labs were suggestive that she may be making too much thyroid hormone but is not too concerning at this time.  Her ultrasound shows 2 rather large  nodules one on the left and one on the right (which is where her neck mass was on exam).  Radiologist is recommending biopsy which will let us know if this could potentially be cancerous.  I did go ahead and order this.  [facility] will call her t o set it up at her earliest convenience.

## 2022-06-27 ENCOUNTER — Ambulatory Visit (HOSPITAL_COMMUNITY)
Admission: RE | Admit: 2022-06-27 | Discharge: 2022-06-27 | Disposition: A | Discharge status: Home / Self Care | Payer: Medicaid Other | Source: Ambulatory Visit | Attending: Nurse Practitioner | Admitting: Nurse Practitioner

## 2022-06-27 ENCOUNTER — Encounter: Payer: Medicaid Other | Admitting: Adult Health

## 2022-06-27 ENCOUNTER — Other Ambulatory Visit: Payer: Self-pay | Admitting: Nurse Practitioner

## 2022-06-27 ENCOUNTER — Encounter (HOSPITAL_COMMUNITY): Payer: Self-pay

## 2022-06-27 DIAGNOSIS — E041 Nontoxic single thyroid nodule: Secondary | ICD-10-CM

## 2022-06-27 DIAGNOSIS — E042 Nontoxic multinodular goiter: Secondary | ICD-10-CM | POA: Insufficient documentation

## 2022-06-27 DIAGNOSIS — E049 Nontoxic goiter, unspecified: Secondary | ICD-10-CM

## 2022-06-27 DIAGNOSIS — E0789 Other specified disorders of thyroid: Secondary | ICD-10-CM | POA: Diagnosis not present

## 2022-06-27 MED ORDER — LIDOCAINE HCL (PF) 2 % IJ SOLN
10.0000 mL | Freq: Once | INTRAMUSCULAR | Status: AC
  Administered 2022-06-27: 10 mL

## 2022-06-27 MED ORDER — LIDOCAINE HCL (PF) 2 % IJ SOLN
INTRAMUSCULAR | Status: AC
  Administered 2022-06-27: 10 mL
  Filled 2022-06-27: qty 10

## 2022-06-27 MED ORDER — LIDOCAINE HCL (PF) 2 % IJ SOLN
INTRAMUSCULAR | Status: AC
  Filled 2022-06-27: qty 10

## 2022-06-27 NOTE — Progress Notes (Signed)
PT tolerated thyroid biopsies well today and verbalized understanding of discharge instructions. PT ambulatory at discharge with no acute distress noted. Samples, cystic fluid, and affirmas prepared and sent to lab at this time for processing.

## 2022-06-28 ENCOUNTER — Encounter: Payer: Self-pay | Admitting: Emergency Medicine

## 2022-06-28 ENCOUNTER — Other Ambulatory Visit: Payer: Self-pay

## 2022-06-28 ENCOUNTER — Ambulatory Visit
Admission: EM | Admit: 2022-06-28 | Discharge: 2022-06-28 | Disposition: A | Discharge status: Home / Self Care | Payer: Medicaid Other | Attending: Family Medicine | Admitting: Family Medicine

## 2022-06-28 DIAGNOSIS — M542 Cervicalgia: Secondary | ICD-10-CM

## 2022-06-28 DIAGNOSIS — Z9889 Other specified postprocedural states: Secondary | ICD-10-CM

## 2022-06-28 DIAGNOSIS — Z3202 Encounter for pregnancy test, result negative: Secondary | ICD-10-CM

## 2022-06-28 LAB — POCT URINE PREGNANCY: Preg Test, Ur: NEGATIVE

## 2022-06-28 MED ORDER — KETOROLAC TROMETHAMINE 30 MG/ML IJ SOLN
30.0000 mg | Freq: Once | INTRAMUSCULAR | Status: AC
  Administered 2022-06-28: 30 mg via INTRAMUSCULAR

## 2022-06-28 NOTE — Discharge Instructions (Signed)
Try to eat only soft foods until your pain is much better.  You may try things like Cepacol lozenges with numbing medicine to help numb the throat to help with eating.  Follow-up if your symptoms worsen

## 2022-06-28 NOTE — ED Provider Notes (Addendum)
[facility]    CSN: [id number] Arrival date & time: 06/28/22  1335      History   Chief Complaint Chief Complaint  Patient presents with   Neck Injury    HPI Bianca Hayes is a 31 y.o. female.   Presenting today with swelling, discomfort to the neck after a thyroid biopsy yesterday.  She states her voice is hoarse and she is having pain and difficulty swallowing today, which was happening before to a lesser extent due to her goiter.  Denies fever, chills, bleeding or drainage from the sites, inability to tolerate p.o., difficulty breathing.  Taking Tylenol with no relief.  Was warned of these types of symptoms at her biopsy yesterday but told the symptoms would be mild to moderate and she feels the symptoms are beyond that.    Past Medical History:  Diagnosis Date   Anemia    Asthma    GERD (gastroesophageal reflux disease)    H/O cervical fracture    Ovarian cyst    Skull fracture (HCC)     There are no problems to display for this patient.   Past Surgical History:  Procedure Laterality Date   DENTAL SURGERY      OB History   No obstetric history on file.      Home Medications    Prior to Admission medications   Medication Sig Start Date End Date Taking? Authorizing Provider  acetaminophen (TYLENOL) 325 MG tablet Take 2 tablets (650 mg total) by mouth every 6 (six) hours as needed. Patient taking differently: Take 650 mg by mouth every 6 (six) hours as needed for mild pain. 02/18/20   Sharman Cheek, MD  etonogestrel (NEXPLANON) 68 MG IMPL implant 65 mg by Subdermal route once. 03/21/16   [provider]  ibuprofen (ADVIL) 800 MG tablet Take 1 tablet (800 mg total) by mouth 3 (three) times daily. 04/30/22   Leath-Warren, Sadie Haber, NP  omeprazole (PRILOSEC) 20 MG capsule Take 20 mg by mouth daily as needed.    [provider]  ondansetron (ZOFRAN-ODT) 4 MG disintegrating tablet Take 1 tablet (4 mg total) by mouth every 8  (eight) hours as needed for nausea or vomiting. Patient not taking: Reported on 06/11/2022 04/30/22   Leath-Warren, Sadie Haber, NP  predniSONE (DELTASONE) 10 MG tablet Take 1 tablet (10 mg total) by mouth daily with breakfast. 06/11/22   Dani Gobble, NP    Family History Family History  Problem Relation Age of Onset   Thyroid disease Mother    Hypertension Mother    Congestive Heart Failure Mother    COPD Mother    Hyperlipidemia Mother    CAD Mother    Stroke Mother    Heart failure Mother    Heart failure Father    CAD Father    Hyperlipidemia Father    Hypertension Father    Immunodeficiency Father    Hypertension Maternal Grandmother    Diabetes Maternal Grandmother    Hypertension Maternal Grandfather    Diabetes Maternal Grandfather    Hypertension Paternal Grandmother    Diabetes Paternal Grandmother    Hypertension Paternal Grandfather    Diabetes Paternal Grandfather     Social History Social History   Tobacco Use   Smoking status: Never   Smokeless tobacco: Never  Vaping Use   Vaping Use: Never used  Substance Use Topics   Alcohol use: No   Drug use: No     Allergies   Coconut flavor,  Iodine, Bactrim [sulfamethoxazole-trimethoprim], and Penicillins   Review of Systems Review of Systems Per HPI  Physical Exam Triage Vital Signs ED Triage Vitals [06/28/22 1417]  Enc Vitals Group     BP (!) 143/76     Pulse Rate 98     Resp 20     Temp 98.2 F (36.8 C)     Temp Source Oral     SpO2 98 %     Weight      Height      Head Circumference      Peak Flow      Pain Score 8     Pain Loc      Pain Edu?      Excl. in GC?    No data found.  Updated Vital Signs BP (!) 143/76 (BP Location: Right Arm)   Pulse 98   Temp 98.2 F (36.8 C) (Oral)   Resp 20   LMP 06/24/2022 Comment: has nexplanon that expired in february, remains in left arm.  SpO2 98%   Visual Acuity Right Eye Distance:   Left Eye Distance:   Bilateral Distance:    Right  Eye Near:   Left Eye Near:    Bilateral Near:     Physical Exam Vitals and nursing note reviewed.  Constitutional:      Appearance: Normal appearance. She is not ill-appearing.  HENT:     Head: Atraumatic.     Mouth/Throat:     Mouth: Mucous membranes are moist.     Comments: Oral airway patent Eyes:     Extraocular Movements: Extraocular movements intact.     Conjunctiva/sclera: Conjunctivae normal.  Neck:     Comments: Thyromegaly present, bandaging from thyroid biopsy intact.  Tender to palpation in this region Cardiovascular:     Rate and Rhythm: Normal rate and regular rhythm.     Heart sounds: Normal heart sounds.  Pulmonary:     Effort: Pulmonary effort is normal. No respiratory distress.     Breath sounds: Normal breath sounds.  Musculoskeletal:        General: Normal range of motion.     Cervical back: Normal range of motion.  Skin:    General: Skin is warm and dry.  Neurological:     Mental Status: She is alert and oriented to person, place, and time.  Psychiatric:        Mood and Affect: Mood normal.        Thought Content: Thought content normal.        Judgment: Judgment normal.      UC Treatments / Results  Labs (all labs ordered are listed, but only abnormal results are displayed) Labs Reviewed  POCT URINE PREGNANCY    EKG   Radiology [place] FNA BX THYROID 1ST LESION AFIRMA  Result Date: 06/27/2022 INDICATION: Right mid lobe thyroid nodule: 5.9 cm Left mid lobe thyroid nodule: 3.5 cm EXAM: ULTRASOUND GUIDED FINE NEEDLE ASPIRATION OF INDETERMINATE THYROID NODULE ULTRASOUND GUIDED FINE NEEDLE ASPIRATION OF 2ND INDETERMINATE THYROID NODULE COMPARISON:  US Thyroid 06/19/22 MEDICATIONS: 20 cc 1% lidocaine COMPLICATIONS: None immediate. TECHNIQUE: Informed written consent was obtained from the patient after a discussion of the risks, benefits and alternatives to treatment. Questions regarding the procedure were encouraged and answered. A timeout was performed  prior to the initiation of the procedure. Pre-procedural ultrasound scanning demonstrated unchanged size and appearance of the indeterminate nodules within the right and left thyroid The procedure was planned. The neck was prepped in the  usual sterile fashion, and a sterile drape was applied covering the operative field. A timeout was performed prior to the initiation of the procedure. Local anesthesia was provided with 1% lidocaine. Under direct ultrasound guidance, 5 FNA biopsies were performed of the right mid lobe thyroid mixed solid/cystic nodule with a 25 gauge needle. 10 cc colloid material aspirated from this nodule---sent to lab for evaluation. 2 samples were obtained for [facility]. Multiple ultrasound images were saved for procedural documentation purposes. The samples were prepared and submitted to pathology. Under direct ultrasound guidance, 5 FNA biopsies were performed of the left mid lobe thyroid nodule with a 25 gauge needle. 2 samples were obtained for [facility]. Multiple ultrasound images were saved for procedural documentation purposes. The samples were prepared and submitted to pathology. Limited post procedural scanning was positive for small hematoma on left--- No additional complication. Dressings were placed. The patient tolerated the above procedures procedure well without immediate postprocedural complication. FINDINGS: Nodule reference number based on prior diagnostic ultrasound: 1 Maximum size: 5.9 cm Location: Right; Mid ACR TI-RADS risk category: TR2 (2 points) Reason for biopsy: Referrer request _________________________________________________________ Nodule reference number based on prior diagnostic ultrasound: 2 Maximum size: 3.5 cm Location: Left; Mid ACR TI-RADS risk category: TR4 (4-6 points) Reason for biopsy: meets ACR TI-RADS criteria Ultrasound imaging confirms appropriate placement of the needles within the thyroid nodule. IMPRESSION: 1. Technically successful ultrasound guided  fine needle aspiration of right mid lobe thyroid nodule 2. Technically successful ultrasound guided fine needle aspiration of left mid lobe thyroid nodule Read by Robet Leu [facility] Electronically Signed   By: Ulyses Southward M.D.   On: 06/27/2022 13:16   [place] FNA BX THYROID EA ADD LESION AFIRMA  Result Date: 06/27/2022 INDICATION: Right mid lobe thyroid nodule: 5.9 cm Left mid lobe thyroid nodule: 3.5 cm EXAM: ULTRASOUND GUIDED FINE NEEDLE ASPIRATION OF INDETERMINATE THYROID NODULE ULTRASOUND GUIDED FINE NEEDLE ASPIRATION OF 2ND INDETERMINATE THYROID NODULE COMPARISON:  US Thyroid 06/19/22 MEDICATIONS: 20 cc 1% lidocaine COMPLICATIONS: None immediate. TECHNIQUE: Informed written consent was obtained from the patient after a discussion of the risks, benefits and alternatives to treatment. Questions regarding the procedure were encouraged and answered. A timeout was performed prior to the initiation of the procedure. Pre-procedural ultrasound scanning demonstrated unchanged size and appearance of the indeterminate nodules within the right and left thyroid The procedure was planned. The neck was prepped in the usual sterile fashion, and a sterile drape was applied covering the operative field. A timeout was performed prior to the initiation of the procedure. Local anesthesia was provided with 1% lidocaine. Under direct ultrasound guidance, 5 FNA biopsies were performed of the right mid lobe thyroid mixed solid/cystic nodule with a 25 gauge needle. 10 cc colloid material aspirated from this nodule---sent to lab for evaluation. 2 samples were obtained for [facility]. Multiple ultrasound images were saved for procedural documentation purposes. The samples were prepared and submitted to pathology. Under direct ultrasound guidance, 5 FNA biopsies were performed of the left mid lobe thyroid nodule with a 25 gauge needle. 2 samples were obtained for [facility]. Multiple ultrasound images were saved for procedural documentation  purposes. The samples were prepared and submitted to pathology. Limited post procedural scanning was positive for small hematoma on left--- No additional complication. Dressings were placed. The patient tolerated the above procedures procedure well without immediate postprocedural complication. FINDINGS: Nodule reference number based on prior diagnostic ultrasound: 1 Maximum size: 5.9 cm Location: Right; Mid ACR TI-RADS risk category: TR2 (2 points)  Reason for biopsy: Referrer request _________________________________________________________ Nodule reference number based on prior diagnostic ultrasound: 2 Maximum size: 3.5 cm Location: Left; Mid ACR TI-RADS risk category: TR4 (4-6 points) Reason for biopsy: meets ACR TI-RADS criteria Ultrasound imaging confirms appropriate placement of the needles within the thyroid nodule. IMPRESSION: 1. Technically successful ultrasound guided fine needle aspiration of right mid lobe thyroid nodule 2. Technically successful ultrasound guided fine needle aspiration of left mid lobe thyroid nodule Read by Robet Leu [facility] Electronically Signed   By: Ulyses Southward M.D.   On: 06/27/2022 13:16    Procedures Procedures (including critical care time)  Medications Ordered in UC Medications  ketorolac (TORADOL) 30 MG/ML injection 30 mg (30 mg Intramuscular Given 06/28/22 1510)    Initial Impression / Assessment and Plan / UC Course  I have reviewed the triage vital signs and the nursing notes.  Pertinent labs & imaging results that were available during my care of the patient were reviewed by me and considered in my medical decision making (see chart for details).     Vitals and exam very reassuring today, urine pregnancy negative, symptoms seem appropriate for postprocedural symptoms.  Discussed soft foods only until feeling better, numbing lozenges, IM Toradol given for pain additionally.  Follow-up with endocrinologist if symptoms not improving.  Work note given  Final  Clinical Impressions(s) / UC Diagnoses   Final diagnoses:  Neck pain  S/P thyroid biopsy     Discharge Instructions      Try to eat only soft foods until your pain is much better.  You may try things like Cepacol lozenges with numbing medicine to help numb the throat to help with eating.  Follow-up if your symptoms worsen    ED Prescriptions   None    PDMP not reviewed this encounter.   Particia Nearing, [place] 06/28/22 1656    Particia Nearing, [place] 07/04/22 1912

## 2022-06-28 NOTE — ED Triage Notes (Addendum)
Pt reports had thyroid biopsy yesterday and reports increased neck swelling and pain since last night. Has tried tylenol with no change in pain/symptoms. Pt also reports voice change since procedure. Airway patent. Nad noted.

## 2022-06-29 LAB — CYTOLOGY - NON PAP

## 2022-07-03 ENCOUNTER — Ambulatory Visit: Payer: Medicaid Other | Admitting: Nurse Practitioner

## 2022-07-04 DIAGNOSIS — E042 Nontoxic multinodular goiter: Secondary | ICD-10-CM | POA: Diagnosis not present

## 2022-07-05 ENCOUNTER — Other Ambulatory Visit: Payer: Self-pay

## 2022-07-05 ENCOUNTER — Ambulatory Visit
Admission: EM | Admit: 2022-07-05 | Discharge: 2022-07-05 | Disposition: A | Discharge status: Home / Self Care | Payer: Self-pay | Attending: Nurse Practitioner | Admitting: Nurse Practitioner

## 2022-07-05 ENCOUNTER — Encounter: Payer: Self-pay | Admitting: Emergency Medicine

## 2022-07-05 DIAGNOSIS — B349 Viral infection, unspecified: Secondary | ICD-10-CM | POA: Insufficient documentation

## 2022-07-05 DIAGNOSIS — Z1152 Encounter for screening for COVID-19: Secondary | ICD-10-CM | POA: Insufficient documentation

## 2022-07-05 DIAGNOSIS — Z20822 Contact with and (suspected) exposure to covid-19: Secondary | ICD-10-CM | POA: Insufficient documentation

## 2022-07-05 LAB — RESP PANEL BY RT-PCR (FLU A&B, COVID) ARPGX2
Influenza A by PCR: NEGATIVE
Influenza B by PCR: NEGATIVE
SARS Coronavirus 2 by RT PCR: NEGATIVE

## 2022-07-05 MED ORDER — PSEUDOEPH-BROMPHEN-DM 30-2-10 MG/5ML PO SYRP: 5.0000 mL | ORAL_SOLUTION | Freq: Four times a day (QID) | ORAL | 0 refills | Status: DC | PRN

## 2022-07-05 MED ORDER — ONDANSETRON 4 MG PO TBDP: 4.0000 mg | ORAL_TABLET | Freq: Three times a day (TID) | ORAL | 0 refills | Status: DC | PRN

## 2022-07-05 NOTE — Discharge Instructions (Addendum)
A COVID test is pending. You will be contacted if the results of the test are positive, you will be contacted to discuss treatment. As discussed, you are a candidate to receive Paxlovid. Take medication as prescribed. Increase fluids and allow for plenty of rest. Recommend Tylenol or ibuprofen as needed for pain, fever, or general discomfort. Warm salt water gargles 3-4 times daily to help with throat pain or discomfort. Recommend using a humidifier at bedtime during sleep to help with cough and nasal congestion. Sleep elevated on 2 pillows. Follow-up if your symptoms do not improve.

## 2022-07-05 NOTE — ED Provider Notes (Signed)
[facility]    CSN: [id number] Arrival date & time: 07/05/22  1707      History   Chief Complaint Chief Complaint  Patient presents with   Fever    HPI Bianca Hayes is a 31 y.o. female.   The history is provided by the patient.   Patient presents for complaints of fever, chills, abdominal pain, vomiting, and loss of taste.  Symptoms have been present for the past several days.  Patient reports a positive known exposure to COVID.  She is not taking a home COVID test.  She has not taken any medication for her symptoms.  Patient reports she has received 1 COVID vaccine, which was [employer].  Past Medical History:  Diagnosis Date   Anemia    Asthma    GERD (gastroesophageal reflux disease)    H/O cervical fracture    Ovarian cyst    Skull fracture (HCC)     There are no problems to display for this patient.   Past Surgical History:  Procedure Laterality Date   BIOPSY THYROID     DENTAL SURGERY      OB History   No obstetric history on file.      Home Medications    Prior to Admission medications   Medication Sig Start Date End Date Taking? Authorizing Provider  brompheniramine-pseudoephedrine-DM 30-2-10 MG/5ML syrup Take 5 mLs by mouth 4 (four) times daily as needed. 07/05/22  Yes Thadius Smisek-Warren, Sadie Haber, NP  ondansetron (ZOFRAN-ODT) 4 MG disintegrating tablet Take 1 tablet (4 mg total) by mouth every 8 (eight) hours as needed for nausea or vomiting. 07/05/22  Yes Maelle Sheaffer-Warren, Sadie Haber, NP  acetaminophen (TYLENOL) 325 MG tablet Take 2 tablets (650 mg total) by mouth every 6 (six) hours as needed. Patient taking differently: Take 650 mg by mouth every 6 (six) hours as needed for mild pain. 02/18/20   Sharman Cheek, MD  etonogestrel (NEXPLANON) 68 MG IMPL implant 65 mg by Subdermal route once. 03/21/16   [provider]  ibuprofen (ADVIL) 800 MG tablet Take 1 tablet (800 mg total) by mouth 3 (three) times daily. 04/30/22   Dearis Danis-Warren,  Sadie Haber, NP  omeprazole (PRILOSEC) 20 MG capsule Take 20 mg by mouth daily as needed.    [provider]  predniSONE (DELTASONE) 10 MG tablet Take 1 tablet (10 mg total) by mouth daily with breakfast. 06/11/22   Dani Gobble, NP    Family History Family History  Problem Relation Age of Onset   Thyroid disease Mother    Hypertension Mother    Congestive Heart Failure Mother    COPD Mother    Hyperlipidemia Mother    CAD Mother    Stroke Mother    Heart failure Mother    Heart failure Father    CAD Father    Hyperlipidemia Father    Hypertension Father    Immunodeficiency Father    Hypertension Maternal Grandmother    Diabetes Maternal Grandmother    Hypertension Maternal Grandfather    Diabetes Maternal Grandfather    Hypertension Paternal Grandmother    Diabetes Paternal Grandmother    Hypertension Paternal Grandfather    Diabetes Paternal Grandfather     Social History Social History   Tobacco Use   Smoking status: Never   Smokeless tobacco: Never  Vaping Use   Vaping Use: Never used  Substance Use Topics   Alcohol use: No   Drug use: No     Allergies   Coconut  flavor, Iodine, Bactrim [sulfamethoxazole-trimethoprim], and Penicillins   Review of Systems Review of Systems Per HPI  Physical Exam Triage Vital Signs ED Triage Vitals  Enc Vitals Group     BP 07/05/22 1819 134/81     Pulse Rate 07/05/22 1819 94     Resp 07/05/22 1819 20     Temp 07/05/22 1819 98.8 F (37.1 C)     Temp Source 07/05/22 1819 Oral     SpO2 07/05/22 1819 98 %     Weight --      Height --      Head Circumference --      Peak Flow --      Pain Score 07/05/22 1823 8     Pain Loc --      Pain Edu? --      Excl. in GC? --    No data found.  Updated Vital Signs BP 134/81 (BP Location: Right Arm)   Pulse 94   Temp 98.8 F (37.1 C) (Oral)   Resp 20   LMP 06/24/2022 Comment: has nexplanon that expired in february, remains in left arm.  SpO2 98%   Visual  Acuity Right Eye Distance:   Left Eye Distance:   Bilateral Distance:    Right Eye Near:   Left Eye Near:    Bilateral Near:     Physical Exam Vitals and nursing note reviewed.  Constitutional:      General: She is not in acute distress.    Appearance: Normal appearance.  HENT:     Head: Normocephalic.     Right Ear: Tympanic membrane, ear canal and external ear normal.     Left Ear: Tympanic membrane, ear canal and external ear normal.     Nose: Congestion present.  Eyes:     Extraocular Movements: Extraocular movements intact.     Conjunctiva/sclera: Conjunctivae normal.     Pupils: Pupils are equal, round, and reactive to light.  Cardiovascular:     Rate and Rhythm: Regular rhythm.     Pulses: Normal pulses.     Heart sounds: Normal heart sounds.  Pulmonary:     Effort: Pulmonary effort is normal.     Breath sounds: Normal breath sounds.  Abdominal:     General: Bowel sounds are normal.     Palpations: Abdomen is soft.  Musculoskeletal:     Cervical back: Normal range of motion.  Lymphadenopathy:     Cervical: No cervical adenopathy.  Skin:    General: Skin is warm and dry.  Neurological:     General: No focal deficit present.     Mental Status: She is alert and oriented to person, place, and time.  Psychiatric:        Mood and Affect: Mood normal.        Behavior: Behavior normal.      UC Treatments / Results  Labs (all labs ordered are listed, but only abnormal results are displayed) Labs Reviewed  RESP PANEL BY RT-PCR (FLU A&B, COVID) [id number]    EKG   Radiology No results found.  Procedures Procedures (including critical care time)  Medications Ordered in UC Medications - No data to display  Initial Impression / Assessment and Plan / UC Course  I have reviewed the triage vital signs and the nursing notes.  Pertinent labs & imaging results that were available during my care of the patient were reviewed by me and considered in my medical  decision making (see chart for details).  The  patient's close contact with COVID-positive relatives and the presence of loss of smell, symptoms appear to be consistent with COVID.  COVID test was collected, results are pending at this time.  Discussed with patient the use of an antiviral, and she is in agreement with receiving fluid.  Patient advised she will be contacted if the results of her COVID test are positive.  If the results of the test are negative, symptoms appear to be consistent with a viral illness at this time.  Supportive care recommendations were provided to the patient.  Patient was prescribed Zofran and Bromfed for her symptoms.  Patient verbalizes understanding.  All questions were answered. Final Clinical Impressions(s) / UC Diagnoses   Final diagnoses:  Close exposure to COVID-19 virus  Viral illness     Discharge Instructions      A COVID test is pending. You will be contacted if the results of the test are positive, you will be contacted to discuss treatment. As discussed, you are a candidate to receive Paxlovid. Take medication as prescribed. Increase fluids and allow for plenty of rest. Recommend Tylenol or ibuprofen as needed for pain, fever, or general discomfort. Warm salt water gargles 3-4 times daily to help with throat pain or discomfort. Recommend using a humidifier at bedtime during sleep to help with cough and nasal congestion. Sleep elevated on 2 pillows. Follow-up if your symptoms do not improve.      ED Prescriptions     Medication Sig Dispense Auth. Provider   ondansetron (ZOFRAN-ODT) 4 MG disintegrating tablet Take 1 tablet (4 mg total) by mouth every 8 (eight) hours as needed for nausea or vomiting. 20 tablet Travus Oren-Warren, Sadie Haber, NP   brompheniramine-pseudoephedrine-DM 30-2-10 MG/5ML syrup Take 5 mLs by mouth 4 (four) times daily as needed. 140 mL Bernice Mullin-Warren, Sadie Haber, NP      PDMP not reviewed this encounter.   Abran Cantor, NP 07/05/22 1955

## 2022-07-05 NOTE — ED Triage Notes (Signed)
Pt reports fever, chills, abdominal pain, emesis. Known covid exposure.

## 2022-07-11 ENCOUNTER — Ambulatory Visit: Payer: Medicaid Other | Admitting: Nurse Practitioner

## 2022-07-19 ENCOUNTER — Encounter (HOSPITAL_COMMUNITY): Payer: Self-pay

## 2022-07-25 ENCOUNTER — Ambulatory Visit (INDEPENDENT_AMBULATORY_CARE_PROVIDER_SITE_OTHER): Payer: Self-pay | Admitting: Nurse Practitioner

## 2022-07-25 ENCOUNTER — Encounter: Payer: Self-pay | Admitting: Nurse Practitioner

## 2022-07-25 VITALS — BP 144/95 | HR 90 | Ht 70.0 in | Wt 370.8 lb

## 2022-07-25 DIAGNOSIS — E049 Nontoxic goiter, unspecified: Secondary | ICD-10-CM | POA: Diagnosis not present

## 2022-07-25 DIAGNOSIS — E041 Nontoxic single thyroid nodule: Secondary | ICD-10-CM | POA: Diagnosis not present

## 2022-07-25 DIAGNOSIS — J398 Other specified diseases of upper respiratory tract: Secondary | ICD-10-CM

## 2022-07-25 MED ORDER — PREDNISONE 10 MG PO TABS
10.0000 mg | ORAL_TABLET | Freq: Every day | ORAL | 0 refills | Status: DC
Start: 2022-07-25 — End: 2023-06-05

## 2022-07-25 NOTE — Progress Notes (Signed)
Endocrinology Follow Up Note 07/25/22    ---------------------------------------------------------------------------------------------------------------------- Subjective    Past Medical History:  Diagnosis Date   Anemia    Asthma    GERD (gastroesophageal reflux disease)    H/O cervical fracture    Ovarian cyst    Skull fracture (HCC)     Past Surgical History:  Procedure Laterality Date   BIOPSY THYROID     DENTAL SURGERY      Social History   Socioeconomic History   Marital status: Divorced    Spouse name: Not on file   Number of children: Not on file   Years of education: Not on file   Highest education level: Not on file  Occupational History   Not on file  Tobacco Use   Smoking status: Never   Smokeless tobacco: Never  Vaping Use   Vaping Use: Never used  Substance and Sexual Activity   Alcohol use: No   Drug use: No   Sexual activity: Yes    Birth control/protection: None    Comment: has expired Nexplanon in place  Other Topics Concern   Not on file  Social History Narrative   Not on file   Social Determinants of Health   Financial Resource Strain: Not on file  Food Insecurity: Not on file  Transportation Needs: Not on file  Physical Activity: Not on file  Stress: Not on file  Social Connections: Not on file  Intimate Partner Violence: Not on file    Current Outpatient Medications on File Prior to Visit  Medication Sig Dispense Refill   acetaminophen (TYLENOL) 325 MG tablet Take 2 tablets (650 mg total) by mouth every 6 (six) hours as needed. (Patient taking differently: Take 650 mg by mouth every 6 (six) hours as needed for mild pain.) 60 tablet 0   brompheniramine-pseudoephedrine-DM 30-2-10 MG/5ML syrup Take 5 mLs by mouth 4 (four) times daily as needed. 140 mL 0   etonogestrel (NEXPLANON) 68 MG IMPL implant 65 mg by Subdermal route once.     ibuprofen (ADVIL) 800 MG tablet Take 1 tablet (800 mg total) by mouth 3 (three) times daily. 30 tablet  0   omeprazole (PRILOSEC) 20 MG capsule Take 20 mg by mouth daily as needed.     ondansetron (ZOFRAN-ODT) 4 MG disintegrating tablet Take 1 tablet (4 mg total) by mouth every 8 (eight) hours as needed for nausea or vomiting. 20 tablet 0   No current facility-administered medications on file prior to visit.      HPI   Bianca Hayes is a 31 y.o.-year-old female, referred by the hospitalist, for evaluation for thyroid nodule found after CT neck with contrast during recent hospitalization.  They originally thought it was oral infection but that was ruled out.    I reviewed pt's thyroid tests: Lab Results  Component Value Date   TSH 0.008 (L) 06/11/2022   TSH 0.010 (L) 05/23/2022   FREET4 1.45 06/11/2022   FREET4 1.05 05/23/2022     Pt complains of: - feeling nodules in neck - hoarseness - dysphagia - choking - SOB with lying down - anxiety - palpitations - nausea  Pt denies: - fatigue - heat intolerance/cold intolerance - tremors - dry skin - hair loss   She does have family history of hypothyroidism in her mother.  No FH of thyroid cancer. No h/o radiation tx to head or neck.  No seaweed or kelp. No recent contrast studies. No steroid use. No herbal supplements. No Biotin supplements or Hair,  Skin and Nails vitamins.    Review of systems  Constitutional: + Minimally fluctuating body weight,  current Body mass index is 53.2 kg/m. , no fatigue, no subjective hyperthermia, no subjective hypothermia Eyes: no blurry vision, no xerophthalmia ENT: + sore throat, + nodules palpated in throat, + dysphagia/odynophagia, + hoarseness Cardiovascular: no chest pain, no shortness of breath, + palpitations, no leg swelling Respiratory: no cough, no shortness of breath Gastrointestinal: no vomiting/diarrhea, + nausea Musculoskeletal: no muscle/joint aches Skin: no rashes, no hyperemia Neurological: no tremors, no numbness, no tingling, no dizziness Psychiatric: no  depression, + anxiety  ---------------------------------------------------------------------------------------------------------------------- Objective    BP (!) 144/95 (BP Location: Left Arm, Patient Position: Sitting, Cuff Size: Normal)   Pulse 90   Ht 5\' 10"  (1.778 m)   Wt (!) 370 lb 12.8 oz (168.2 kg)   LMP 06/24/2022 Comment: has nexplanon that expired in february, remains in left arm.  BMI 53.20 kg/m    BP Readings from Last 3 Encounters:  07/25/22 (!) 144/95  07/05/22 134/81  06/28/22 (!) 143/76    Wt Readings from Last 3 Encounters:  07/25/22 (!) 370 lb 12.8 oz (168.2 kg)  06/11/22 (!) 364 lb (165.1 kg)  05/23/22 (!) 340 lb (154.2 kg)     Physical Exam- Limited  Constitutional:  Body mass index is 53.2 kg/m. , not in acute distress, normal state of mind Eyes:  EOMI, no exophthalmos Neck: Supple Thyroid: ++ right thyroid mass, tender to palpation Cardiovascular: RRR, no murmurs, rubs, or gallops, no edema Respiratory: Adequate breathing efforts, no crackles, rales, rhonchi, or wheezing Musculoskeletal: no gross deformities, strength intact in all four extremities, no gross restriction of joint movements Skin:  no rashes, no hyperemia Neurological: no tremor with outstretched hands    CT neck with contrast 05/23/22 CLINICAL DATA:  Initial evaluation for soft tissue swelling, infection suspected.   EXAM: CT NECK WITH CONTRAST   TECHNIQUE: Multidetector CT imaging of the neck was performed using the standard protocol following the bolus administration of intravenous contrast.   RADIATION DOSE REDUCTION: This exam was performed according to the departmental dose-optimization program which includes automated exposure control, adjustment of the mA and/or kV according to patient size and/or use of iterative reconstruction technique.   CONTRAST:  39mL OMNIPAQUE IOHEXOL 300 MG/ML  SOLN   COMPARISON:  Prior radiograph from earlier the same day.    FINDINGS: Pharynx and larynx: Oral cavity within normal limits. Oropharynx and nasopharynx within normal limits. No retropharyngeal collection or swelling. Negative epiglottis. Hypopharynx and supraglottic larynx within normal limits. Glottis within normal limits. Subglottic airway deviated to the left but remains patent.   Salivary glands: Salivary glands including the parotid glands are within normal limits. Submandibular glands are either hypoplastic or absent, neither which is well visualized.   Thyroid: Heterogeneous predominantly hypodense mass positioned along the lateral margin of the right lobe of thyroid measures approximately 4.5 x 3.1 x 5.5 cm (series 2, image 84). This is favored to reflect an enlarged heterogeneous thyroid nodule. Mild mass effect on the adjacent trachea which is deviated to the left but remains patent. Contralateral left thyroid lobe within normal limits.   Lymph nodes: Mildly prominent level 2 lymph nodes measure at the upper limits of normal at 1 cm in short axis. No overtly enlarged or pathologic adenopathy within the neck.   Vascular: Normal intravascular enhancement seen throughout the neck. Right CCA and right IJ are deviated laterally by the large right thyroid nodule.   Limited intracranial:  Unremarkable.   Visualized orbits: Unremarkable.   Mastoids and visualized paranasal sinuses: Visualized paranasal sinuses are clear. Visualized mastoids and middle ear cavities are well pneumatized and free of fluid.   Skeleton: No discrete or worrisome osseous lesions.   Upper chest: Unremarkable.   Other: None.   IMPRESSION: 1. 4.5 x 3.1 x 5.5 cm heterogeneous mass positioned along the lateral margin of the right lobe of thyroid, favored to reflect an enlarged heterogeneous thyroid nodule. Further evaluation with dedicated thyroid ultrasound recommended (ref: J Am Coll Radiol. 2015 Feb;12(2): 143-50). 2. No other acute abnormality within  the neck.     Electronically Signed   By: Jeannine Boga M.D.   On: 05/23/2022 23:08     Latest Reference Range & Units 05/23/22 20:46 05/23/22 23:50 06/11/22 13:54  TSH 0.450 - 4.500 uIU/mL 0.010 (L)  0.008 (L)  Triiodothyronine,Free,Serum 2.0 - 4.4 pg/mL  4.8 (H) 5.3 (H)  Triiodothyronine (T3) 71 - 180 ng/dL 242 (H)    T4,Free(Direct) 0.82 - 1.77 ng/dL  1.05 1.45  Thyroxine (T4) 4.5 - 12.0 ug/dL 10.9    Thyroperoxidase Ab SerPl-aCnc 0 - 34 IU/mL   11  Thyroglobulin Antibody 0.0 - 0.9 IU/mL   <1.0  (L): Data is abnormally low (H): Data is abnormally high   Thyroid US from 06/19/22 CLINICAL DATA:  Right thyroid mass   EXAM: THYROID ULTRASOUND   TECHNIQUE: Ultrasound examination of the thyroid gland and adjacent soft tissues was performed.   COMPARISON:  CT soft tissue neck 05/23/2022   FINDINGS: Parenchymal Echotexture: Mildly heterogenous   Isthmus: 0.2 cm   Right lobe: 6.9 x 4.3 x 4.2 cm   Left lobe: 4.3 x 2.2 x 2.0 cm   _________________________________________________________   Estimated total number of nodules >/= 1 cm: 2   Number of spongiform nodules >/=  2 cm not described below (TR1): 0   Number of mixed cystic and solid nodules >/= 1.5 cm not described below (TR2): 0   _________________________________________________________   Nodule # 1:   Location: Right; mid   Maximum size: 5.9 cm; Other 2 dimensions: 4.2 x 4.5 cm   Composition: mixed cystic and solid (1)   Echogenicity: isoechoic (1)   Shape: not taller-than-wide (0)   Margins: smooth (0)   Echogenic foci: none (0)   ACR TI-RADS total points: 2.   ACR TI-RADS risk category: TR2 (2 points).   ACR TI-RADS recommendations:   This nodule does NOT meet TI-RADS criteria for biopsy or dedicated follow-up.   _________________________________________________________   Nodule # 2:   Location: Left; mid   Maximum size: 3.5 cm; Other 2 dimensions: 2.1 x 1.9 cm    Composition: solid/almost completely solid (2)   Echogenicity: hypoechoic (2)   Shape: not taller-than-wide (0)   Margins: ill-defined (0)   Echogenic foci: none (0)   ACR TI-RADS total points: 4.   ACR TI-RADS risk category: TR4 (4-6 points).   ACR TI-RADS recommendations:   **Given size (>/= 1.5 cm) and appearance, fine needle aspiration of this moderately suspicious nodule should be considered based on TI-RADS criteria.   IMPRESSION: 1. Nodule 2 (TI-RADS 4), measuring 3.5 cm, located in the mid left thyroid lobe, meets criteria for FNA. 2. Abnormality seen on recent soft tissue neck CT corresponds to mixed solid cystic right mid thyroid nodule. If this nodule is causing significant symptoms, it is amenable to ultrasound-guided aspiration.   The above is in keeping with the ACR TI-RADS recommendations - J Am  Coll Radiol 1751;02:585-277.     Electronically Signed   By: Miachel Roux M.D.   On: 06/20/2022 11:16      ----------------------------------------------------------------------------------------------------------------------  ASSESSMENT / PLAN:  1. Goiter 2. ? Hyperthyroidism 3. Thyroid nodule  She was seen in the ED for mass on her right side of her neck and a thyroid nodule was incidentally seen on the CT scan of her neck.  Her TSH was also slightly suppressed at that time and had mildly elevated Free T3.  Her thyroid ultrasound shows MNG recommending FNA of moderately suspicious nodule.  This was done and sent to [facility] for further testing.  Pathology results favor benignity.  She does have significant neck compression symptoms and pain related to the sheer size of her thyroid gland, therefore I did recommend surgical consult for total thyroidectomy.   - I did explain that, while thyroid surgery is not a complicated one, it still can have side effects and also she will mostly likely become hypothyroidism s/p surgery which would require thyroid hormone  supplementation for the rest of her life.  She prefers to see someone in the [facility] network due to financial limitations.   Follow Up Plan: Return in about 4 weeks (around 08/22/2022) for Thyroid follow up, Previsit labs; surgical consult.     I spent 43 minutes in the care of the patient today including review of labs from Thyroid Function, CMP, and other relevant labs ; imaging/biopsy records (current and previous including abstractions from other facilities); face-to-face time discussing  her lab results and symptoms, medications doses, her options of short and long term treatment based on the latest standards of care / guidelines;   and documenting the encounter.  Richardson Dopp  participated in the discussions, expressed understanding, and voiced agreement with the above plans.  All questions were answered to her satisfaction. she is encouraged to contact clinic should she have any questions or concerns prior to her return visit.    Rayetta Pigg, [facility] [address] [place], [place] [address] Phone: [phone] Fax: [phone]

## 2022-07-26 ENCOUNTER — Telehealth: Payer: Self-pay | Admitting: *Deleted

## 2022-07-26 LAB — T4, FREE: Free T4: 1.32 ng/dL (ref 0.82–1.77)

## 2022-07-26 LAB — T3, FREE: T3, Free: 4.1 pg/mL (ref 2.0–4.4)

## 2022-07-26 LAB — TSH: TSH: 0.013 u[IU]/mL — ABNORMAL LOW (ref 0.450–4.500)

## 2022-07-26 NOTE — Telephone Encounter (Signed)
-----   Message from Brita Romp, NP sent at 07/26/2022 10:09 AM EDT ----- Please let her know that her thyroid labs are still a bit off (mainly the TSH), but all others are normal.  The TSH is usually the last one to correct, and it has improved.  Still don't need any treatment for it.

## 2022-07-26 NOTE — Telephone Encounter (Signed)
Patient was called , message left and made aware of her lab results per Rayetta Pigg, NP.

## 2022-08-28 ENCOUNTER — Ambulatory Visit: Payer: Medicaid Other | Admitting: Nurse Practitioner

## 2022-09-10 ENCOUNTER — Ambulatory Visit: Payer: Medicaid Other | Admitting: Nurse Practitioner

## 2022-09-27 ENCOUNTER — Ambulatory Visit
Admission: EM | Admit: 2022-09-27 | Discharge: 2022-09-27 | Disposition: A | Discharge status: Home / Self Care | Payer: Self-pay | Attending: Nurse Practitioner | Admitting: Nurse Practitioner

## 2022-09-27 DIAGNOSIS — J029 Acute pharyngitis, unspecified: Secondary | ICD-10-CM | POA: Insufficient documentation

## 2022-09-27 DIAGNOSIS — R519 Headache, unspecified: Secondary | ICD-10-CM | POA: Insufficient documentation

## 2022-09-27 DIAGNOSIS — Z1152 Encounter for screening for COVID-19: Secondary | ICD-10-CM | POA: Insufficient documentation

## 2022-09-27 DIAGNOSIS — Z79899 Other long term (current) drug therapy: Secondary | ICD-10-CM | POA: Insufficient documentation

## 2022-09-27 LAB — RESP PANEL BY RT-PCR (FLU A&B, COVID) ARPGX2
Influenza A by PCR: NEGATIVE
Influenza B by PCR: NEGATIVE
SARS Coronavirus 2 by RT PCR: NEGATIVE

## 2022-09-27 LAB — POCT RAPID STREP A (OFFICE): Rapid Strep A Screen: NEGATIVE

## 2022-09-27 MED ORDER — FLUTICASONE PROPIONATE 50 MCG/ACT NA SUSP: 2.0000 | Freq: Every day | NASAL | 0 refills | Status: DC

## 2022-09-27 MED ORDER — KETOROLAC TROMETHAMINE 30 MG/ML IJ SOLN
30.0000 mg | Freq: Once | INTRAMUSCULAR | Status: AC
  Administered 2022-09-27: 30 mg via INTRAMUSCULAR

## 2022-09-27 MED ORDER — DEXAMETHASONE SODIUM PHOSPHATE 10 MG/ML IJ SOLN
10.0000 mg | INTRAMUSCULAR | Status: AC
  Administered 2022-09-27: 10 mg via INTRAMUSCULAR

## 2022-09-27 NOTE — Discharge Instructions (Addendum)
Rapid strep test is negative, COVID/flu and throat culture are pending.  If the pending test results are positive, you will be contacted.  If your COVID test result is positive, you are a candidate to receive Paxlovid as antiviral therapy. Take medication as prescribed. Increase fluids and allow for plenty of rest. Recommend Tylenol or ibuprofen as needed for pain, fever, or general discomfort. Recommend throat lozenges, Chloraseptic or honey to help with throat pain. Warm salt water gargles 3-4 times daily to help with throat pain or discomfort. Recommend a diet with soft foods to include soups, broths, puddings, yogurt, Jell-O's, or popsicles until symptoms improve.  For your headache: You have been given a medication called Toradol for your headache.  This is in the same class of medication as the ibuprofen you are currently taking.  Do not take any more ibuprofen today because you have had this medication. Increase fluids and allow for plenty of rest. Continue ibuprofen.  Make sure you are taking medication with food and water.  Do not exceed more than 800 mg at each dose every 8 hours. Recommend sleeping in a dimly lit room with decreased sound. May use a cool cough to the forehead to help with headache pain. As discussed, if you develop lightheadedness, dizziness, blurred vision, weakness, slurred speech, or other concerns, please go to the emergency department. If symptoms do not improve, please follow-up with your primary care physician for further headache prophylaxis. Follow-up as needed. Follow-up if symptoms do not improve.

## 2022-09-27 NOTE — ED Provider Notes (Signed)
[facility]    CSN: [id number] Arrival date & time: 09/27/22  1525      History   Chief Complaint Chief Complaint  Patient presents with   Sore Throat    HPI Bianca Hayes is a 31 y.o. female.   The history is provided by the patient.   Patient presents for complaints of headache and sore throat.  Symptoms started approximately 24 to 48 hours ago.  Patient states that her throat pain has worsened since it started.  She denies fever, chills, ear pain, cough, abdominal pain, vomiting, or diarrhea.  She also complains of a migraine headache that started around the same time.  She states that she has photophobia and nausea.  Denies phonophobia, lightheadedness, dizziness, or blurred vision.  Patient states that her headaches are triggered when she does not eat appropriately.  She states that she does not have a neurologist at this time.  Headache is located at her bilateral temporal regions.  She has been taking ibuprofen for her headache pain with minimal relief.  Past Medical History:  Diagnosis Date   Anemia    Asthma    GERD (gastroesophageal reflux disease)    H/O cervical fracture    Ovarian cyst    Skull fracture (HCC)     There are no problems to display for this patient.   Past Surgical History:  Procedure Laterality Date   BIOPSY THYROID     DENTAL SURGERY      OB History   No obstetric history on file.      Home Medications    Prior to Admission medications   Medication Sig Start Date End Date Taking? Authorizing Provider  etonogestrel (NEXPLANON) 68 MG IMPL implant 1 each by Subdermal route once.   Yes [provider]  fluticasone (FLONASE) 50 MCG/ACT nasal spray Place 2 sprays into both nostrils daily. 09/27/22  Yes Mitchell Iwanicki-Warren, Sadie Haber, NP  Pseudoeph-Doxylamine-DM-APAP (NYQUIL PO) Take by mouth.   Yes [provider]  acetaminophen (TYLENOL) 325 MG tablet Take 2 tablets (650 mg total) by mouth every 6 (six) hours  as needed. Patient taking differently: Take 650 mg by mouth every 6 (six) hours as needed for mild pain. 02/18/20   Sharman Cheek, MD  brompheniramine-pseudoephedrine-DM 30-2-10 MG/5ML syrup Take 5 mLs by mouth 4 (four) times daily as needed. 07/05/22   Mady Oubre-Warren, Sadie Haber, NP  etonogestrel (NEXPLANON) 68 MG IMPL implant 65 mg by Subdermal route once. 03/21/16   [provider]  ibuprofen (ADVIL) 800 MG tablet Take 1 tablet (800 mg total) by mouth 3 (three) times daily. 04/30/22   Trish Mancinelli-Warren, Sadie Haber, NP  omeprazole (PRILOSEC) 20 MG capsule Take 20 mg by mouth daily as needed.    [provider]  ondansetron (ZOFRAN-ODT) 4 MG disintegrating tablet Take 1 tablet (4 mg total) by mouth every 8 (eight) hours as needed for nausea or vomiting. 07/05/22   Thien Berka-Warren, Sadie Haber, NP  predniSONE (DELTASONE) 10 MG tablet Take 1 tablet (10 mg total) by mouth daily with breakfast. 07/25/22   Dani Gobble, NP    Family History Family History  Problem Relation Age of Onset   Thyroid disease Mother    Hypertension Mother    Congestive Heart Failure Mother    COPD Mother    Hyperlipidemia Mother    CAD Mother    Stroke Mother    Heart failure Mother    Heart failure Father    CAD Father  Hyperlipidemia Father    Hypertension Father    Immunodeficiency Father    Hypertension Maternal Grandmother    Diabetes Maternal Grandmother    Hypertension Maternal Grandfather    Diabetes Maternal Grandfather    Hypertension Paternal Grandmother    Diabetes Paternal Grandmother    Hypertension Paternal Grandfather    Diabetes Paternal Grandfather     Social History Social History   Tobacco Use   Smoking status: Never   Smokeless tobacco: Never  Vaping Use   Vaping Use: Never used  Substance Use Topics   Alcohol use: No   Drug use: No     Allergies   Coconut flavor, Iodine, Bactrim [sulfamethoxazole-trimethoprim], and Penicillins   Review of Systems Review of  Systems Per HPI  Physical Exam Triage Vital Signs ED Triage Vitals  Enc Vitals Group     BP 09/27/22 1548 (!) 152/93     Pulse Rate 09/27/22 1548 83     Resp 09/27/22 1548 20     Temp 09/27/22 1548 98.6 F (37 C)     Temp Source 09/27/22 1548 Oral     SpO2 09/27/22 1548 97 %     Weight --      Height --      Head Circumference --      Peak Flow --      Pain Score 09/27/22 1550 10     Pain Loc --      Pain Edu? --      Excl. in GC? --    No data found.  Updated Vital Signs BP (!) 152/93 (BP Location: Right Wrist)   Pulse 83   Temp 98.6 F (37 C) (Oral)   Resp 20   SpO2 97%   Visual Acuity Right Eye Distance:   Left Eye Distance:   Bilateral Distance:    Right Eye Near:   Left Eye Near:    Bilateral Near:     Physical Exam Vitals and nursing note reviewed.  Constitutional:      General: She is not in acute distress.    Appearance: She is well-developed.  HENT:     Head: Normocephalic.     Right Ear: Tympanic membrane and ear canal normal.     Left Ear: Tympanic membrane and ear canal normal.     Nose: No congestion.     Mouth/Throat:     Pharynx: Pharyngeal swelling and posterior oropharyngeal erythema present.     Tonsils: No tonsillar exudate. 1+ on the right. 1+ on the left.  Eyes:     Conjunctiva/sclera: Conjunctivae normal.     Pupils: Pupils are equal, round, and reactive to light.  Cardiovascular:     Rate and Rhythm: Normal rate and regular rhythm.     Heart sounds: Normal heart sounds.  Pulmonary:     Effort: Pulmonary effort is normal. No respiratory distress.     Breath sounds: Normal breath sounds. No stridor. No wheezing, rhonchi or rales.  Abdominal:     General: Bowel sounds are normal.     Palpations: Abdomen is soft.     Tenderness: There is no abdominal tenderness.  Musculoskeletal:     Cervical back: Normal range of motion.  Lymphadenopathy:     Cervical: No cervical adenopathy.  Skin:    General: Skin is warm and dry.   Neurological:     General: No focal deficit present.     Mental Status: She is alert and oriented to person, place, and time.  GCS: GCS eye subscore is 4. GCS verbal subscore is 5. GCS motor subscore is 6.     Cranial Nerves: Cranial nerves 2-12 are intact.     Sensory: Sensation is intact.     Motor: Motor function is intact.     Coordination: Coordination is intact.  Psychiatric:        Mood and Affect: Mood normal.        Behavior: Behavior normal.      UC Treatments / Results  Labs (all labs ordered are listed, but only abnormal results are displayed) Labs Reviewed  CULTURE, GROUP A STREP ([facility])  RESP PANEL BY RT-PCR (FLU A&B, COVID) ARPGX2  POCT RAPID STREP A (OFFICE)    EKG   Radiology No results found.  Procedures Procedures (including critical care time)  Medications Ordered in UC Medications  ketorolac (TORADOL) 30 MG/ML injection 30 mg (30 mg Intramuscular Given 09/27/22 1622)  dexamethasone (DECADRON) injection 10 mg (10 mg Intramuscular Given 09/27/22 1623)    Initial Impression / Assessment and Plan / UC Course  I have reviewed the triage vital signs and the nursing notes.  Pertinent labs & imaging results that were available during my care of the patient were reviewed by me and considered in my medical decision making (see chart for details).  Patient presents for complaints of sore throat and headache.  Patient is well-appearing, she is in no acute distress, she is mildly hypertensive.  Rapid strep test is negative, throat culture and COVID/flu test are pending.  Patient is a candidate to receive Paxlovid if the COVID test is positive.  Symptoms are consistent with acute pharyngitis, nasal congestion and headache.  Difficult to ascertain if this is a true migraine based on the presentation, but given the patient's report, we will proceed with this diagnosis.  Patient was administered Toradol 30 mg and Decadron 10 mg IM in the clinic today.  Patient was  advised to continue ibuprofen that she is currently taking, but given strict precautions with taking more than the intended amount.  For her nasal congestion, patient was prescribed fluticasone 50 mcg nasal spray.  Patient was advised to refrain from ibuprofen for the remainder of the day as she has received Toradol today.  Supportive care recommendations were provided to the patient.  Strict ER precautions were provided to the patient.  Patient was advised that it may be helpful for her to establish care with her primary care physician she may allow referral to a neurologist for better management and prophylaxis of her headaches.  Patient verbalizes understanding.  All questions were answered.  Patient is stable for discharge. Final Clinical Impressions(s) / UC Diagnoses   Final diagnoses:  Acute pharyngitis, unspecified etiology  Nonintractable headache, unspecified chronicity pattern, unspecified headache type     Discharge Instructions      Rapid strep test is negative, COVID/flu and throat culture are pending.  If the pending test results are positive, you will be contacted.  If your COVID test result is positive, you are a candidate to receive Paxlovid as antiviral therapy. Take medication as prescribed. Increase fluids and allow for plenty of rest. Recommend Tylenol or ibuprofen as needed for pain, fever, or general discomfort. Recommend throat lozenges, Chloraseptic or honey to help with throat pain. Warm salt water gargles 3-4 times daily to help with throat pain or discomfort. Recommend a diet with soft foods to include soups, broths, puddings, yogurt, Jell-O's, or popsicles until symptoms improve.  For your headache: You have  been given a medication called Toradol for your headache.  This is in the same class of medication as the ibuprofen you are currently taking.  Do not take any more ibuprofen today because you have had this medication. Increase fluids and allow for plenty of  rest. Continue ibuprofen.  Make sure you are taking medication with food and water.  Do not exceed more than 800 mg at each dose every 8 hours. Recommend sleeping in a dimly lit room with decreased sound. May use a cool cough to the forehead to help with headache pain. As discussed, if you develop lightheadedness, dizziness, blurred vision, weakness, slurred speech, or other concerns, please go to the emergency department. If symptoms do not improve, please follow-up with your primary care physician for further headache prophylaxis. Follow-up as needed. Follow-up if symptoms do not improve.       ED Prescriptions     Medication Sig Dispense Auth. Provider   fluticasone (FLONASE) 50 MCG/ACT nasal spray Place 2 sprays into both nostrils daily. 16 g Evolet Salminen-Warren, Sadie Haberhristie J, NP      PDMP not reviewed this encounter.   Abran CantorLeath-Warren, Jason Hauge J, NP 09/27/22 1636

## 2022-09-27 NOTE — ED Triage Notes (Signed)
Pt reports headache and sore throat x 2 days. NyQuil gives some relief.

## 2022-09-30 LAB — CULTURE, GROUP A STREP (THRC)

## 2022-10-15 ENCOUNTER — Ambulatory Visit: Payer: Medicaid Other | Admitting: Nurse Practitioner

## 2023-02-22 ENCOUNTER — Ambulatory Visit
Admission: EM | Admit: 2023-02-22 | Discharge: 2023-02-22 | Disposition: A | Discharge status: Home / Self Care | Payer: Self-pay | Attending: Nurse Practitioner | Admitting: Nurse Practitioner

## 2023-02-22 DIAGNOSIS — R1032 Left lower quadrant pain: Secondary | ICD-10-CM | POA: Insufficient documentation

## 2023-02-22 DIAGNOSIS — R112 Nausea with vomiting, unspecified: Secondary | ICD-10-CM | POA: Insufficient documentation

## 2023-02-22 LAB — POCT URINALYSIS DIP (MANUAL ENTRY)
Bilirubin, UA: NEGATIVE
Glucose, UA: NEGATIVE mg/dL
Ketones, POC UA: NEGATIVE mg/dL
Nitrite, UA: NEGATIVE
Protein Ur, POC: 30 mg/dL — AB
Spec Grav, UA: 1.025 (ref 1.010–1.025)
Urobilinogen, UA: 2 E.U./dL — AB
pH, UA: 7 (ref 5.0–8.0)

## 2023-02-22 LAB — POCT URINE PREGNANCY: Preg Test, Ur: NEGATIVE

## 2023-02-22 MED ORDER — ONDANSETRON 4 MG PO TBDP
4.0000 mg | ORAL_TABLET | Freq: Once | ORAL | Status: AC
  Administered 2023-02-22: 4 mg via ORAL

## 2023-02-22 NOTE — ED Triage Notes (Signed)
Pt c/o nausea and vomiting, headache, tenderness in abdomen, onset this morning

## 2023-02-22 NOTE — ED Provider Notes (Signed)
[facility]    CSN: [id number] Arrival date & time: 02/22/23  1551      History   Chief Complaint No chief complaint on file.   HPI Bianca Hayes is a 32 y.o. female.   The history is provided by the patient.   The patient presents for complaints of nausea, vomiting, headache, left lower quadrant abdominal tenderness, and fatigue.  Symptoms started this morning.  She states today, she has vomited approximately 5 times.  She vomited during this appointment.  Patient denies fever, chills, upper respiratory symptoms, diarrhea, constipation, or urinary symptoms.  Patient states that she has not had any new foods or tried anything that may cause her stomach to be upset.  She reports that she did start her menstrual cycle today.  Patient states that the pain in her lower abdomen cramping, she feels it may be associated with her menstrual cycle.  Patient states that she does have a Nexplanon that expired approximately 1 year ago.  She states since that time, she has had irregular pulse. Past Medical History:  Diagnosis Date   Anemia    Asthma    GERD (gastroesophageal reflux disease)    H/O cervical fracture    Ovarian cyst    Skull fracture (HCC)     There are no problems to display for this patient.   Past Surgical History:  Procedure Laterality Date   BIOPSY THYROID     DENTAL SURGERY      OB History   No obstetric history on file.      Home Medications    Prior to Admission medications   Medication Sig Start Date End Date Taking? Authorizing Provider  acetaminophen (TYLENOL) 325 MG tablet Take 2 tablets (650 mg total) by mouth every 6 (six) hours as needed. Patient taking differently: Take 650 mg by mouth every 6 (six) hours as needed for mild pain. 02/18/20   Sharman Cheek, MD  brompheniramine-pseudoephedrine-DM 30-2-10 MG/5ML syrup Take 5 mLs by mouth 4 (four) times daily as needed. 07/05/22   Mignonne Afonso-Warren, Sadie Haber, NP  etonogestrel (NEXPLANON)  68 MG IMPL implant 65 mg by Subdermal route once. 03/21/16   [provider]  etonogestrel (NEXPLANON) 68 MG IMPL implant 1 each by Subdermal route once.    [provider]  fluticasone (FLONASE) 50 MCG/ACT nasal spray Place 2 sprays into both nostrils daily. 09/27/22   Jericho Cieslik-Warren, Sadie Haber, NP  ibuprofen (ADVIL) 800 MG tablet Take 1 tablet (800 mg total) by mouth 3 (three) times daily. 04/30/22   Ameet Sandy-Warren, Sadie Haber, NP  omeprazole (PRILOSEC) 20 MG capsule Take 20 mg by mouth daily as needed.    [provider]  ondansetron (ZOFRAN-ODT) 4 MG disintegrating tablet Take 1 tablet (4 mg total) by mouth every 8 (eight) hours as needed for nausea or vomiting. 07/05/22   Keiasha Diep-Warren, Sadie Haber, NP  predniSONE (DELTASONE) 10 MG tablet Take 1 tablet (10 mg total) by mouth daily with breakfast. 07/25/22   Dani Gobble, NP  Pseudoeph-Doxylamine-DM-APAP (NYQUIL PO) Take by mouth.    [provider]    Family History Family History  Problem Relation Age of Onset   Thyroid disease Mother    Hypertension Mother    Congestive Heart Failure Mother    COPD Mother    Hyperlipidemia Mother    CAD Mother    Stroke Mother    Heart failure Mother    Heart failure Father    CAD Father  Hyperlipidemia Father    Hypertension Father    Immunodeficiency Father    Hypertension Maternal Grandmother    Diabetes Maternal Grandmother    Hypertension Maternal Grandfather    Diabetes Maternal Grandfather    Hypertension Paternal Grandmother    Diabetes Paternal Grandmother    Hypertension Paternal Grandfather    Diabetes Paternal Grandfather     Social History Social History   Tobacco Use   Smoking status: Never   Smokeless tobacco: Never  Vaping Use   Vaping Use: Never used  Substance Use Topics   Alcohol use: No   Drug use: No     Allergies   Coconut flavor, Iodine, Bactrim [sulfamethoxazole-trimethoprim], and Penicillins   Review of  Systems Review of Systems Per HPI  Physical Exam Triage Vital Signs ED Triage Vitals  Enc Vitals Group     BP 02/22/23 1756 (!) 153/90     Pulse Rate 02/22/23 1756 87     Resp 02/22/23 1756 15     Temp 02/22/23 1756 98.5 F (36.9 C)     Temp Source 02/22/23 1756 Oral     SpO2 02/22/23 1756 97 %     Weight --      Height --      Head Circumference --      Peak Flow --      Pain Score 02/22/23 1758 10     Pain Loc --      Pain Edu? --      Excl. in GC? --    No data found.  Updated Vital Signs BP (!) 153/90 (BP Location: Right Arm)   Pulse 87   Temp 98.5 F (36.9 C) (Oral)   Resp 15   LMP 02/22/2023 (Exact Date)   SpO2 97%   Visual Acuity Right Eye Distance:   Left Eye Distance:   Bilateral Distance:    Right Eye Near:   Left Eye Near:    Bilateral Near:     Physical Exam Vitals and nursing note reviewed.  Constitutional:      General: She is not in acute distress.    Appearance: Normal appearance.  HENT:     Head: Normocephalic.  Eyes:     Extraocular Movements: Extraocular movements intact.     Conjunctiva/sclera: Conjunctivae normal.     Pupils: Pupils are equal, round, and reactive to light.  Cardiovascular:     Rate and Rhythm: Normal rate and regular rhythm.     Pulses: Normal pulses.     Heart sounds: Normal heart sounds.  Pulmonary:     Effort: Pulmonary effort is normal. No respiratory distress.     Breath sounds: Normal breath sounds. No stridor. No wheezing, rhonchi or rales.  Abdominal:     General: Bowel sounds are normal.     Palpations: Abdomen is soft.     Tenderness: There is abdominal tenderness.  Musculoskeletal:     Cervical back: Normal range of motion.  Lymphadenopathy:     Cervical: No cervical adenopathy.  Skin:    General: Skin is warm and dry.  Neurological:     General: No focal deficit present.     Mental Status: She is alert and oriented to person, place, and time.     GCS: GCS eye subscore is 4. GCS verbal subscore  is 5. GCS motor subscore is 6.  Psychiatric:        Mood and Affect: Mood normal.        Behavior: Behavior normal.  UC Treatments / Results  Labs (all labs ordered are listed, but only abnormal results are displayed) Labs Reviewed  POCT URINALYSIS DIP (MANUAL ENTRY) - Abnormal; Notable for the following components:      Result Value   Color, UA brown (*)    Clarity, UA hazy (*)    Blood, UA large (*)    Protein Ur, POC =30 (*)    Urobilinogen, UA 2.0 (*)    Leukocytes, UA Trace (*)    All other components within normal limits  URINE CULTURE  POCT URINE PREGNANCY    EKG   Radiology No results found.  Procedures Procedures (including critical care time)  Medications Ordered in UC Medications  ondansetron (ZOFRAN-ODT) disintegrating tablet 4 mg (4 mg Oral Given 02/22/23 1808)    Initial Impression / Assessment and Plan / UC Course  I have reviewed the triage vital signs and the nursing notes.  Pertinent labs & imaging results that were available during my care of the patient were reviewed by me and considered in my medical decision making (see chart for details).  The patient's vital signs show that she is hypertensive, she is otherwise stable.  Urinalysis does not show an obvious UTI, urine culture has been ordered.  Urine pregnancy test is negative.  Suspect a viral illness at this time.  Patient was treated with Zofran 4 mg during her appointment, which has helped with her nausea and vomiting.  Zofran 4 mg every 8 hours will be prescribed for continued nausea and vomiting.  Supportive care recommendations were provided and discussed with the patient to include over-the-counter Tylenol or ibuprofen as needed for pain or discomfort, recommending a step up diet, and use of Pedialyte or Gatorolyte to prevent dehydration.  Patient was advised that if symptoms do not improve within the next 24 hours, or if they suddenly worsen, would like for her to follow-up in the  emergency department for further evaluation.  Patient is in agreement with this plan of care and verbalizes understanding.  All questions were answered.  Patient stable for discharge.  Work note was provided.   Final Clinical Impressions(s) / UC Diagnoses   Final diagnoses:  Left lower quadrant abdominal pain  Nausea and vomiting, unspecified vomiting type     Discharge Instructions      Take medication as prescribed. Increase fluids and allow for plenty of rest.  Try to drink at least 8-10 8 ounce glasses of water if you are able.  If not, recommend the use of Pedialyte or Gatorolyte to prevent dehydration. May take over-the-counter Tylenol as needed for pain, fever, or general discomfort. Recommend a step up diet until your nausea and vomiting improved.  You would start with a clear liquid diet to include soup, broth, popsicles, Jell-O, Sprite, or ginger ale.  Once you are able to tolerate that, you can advance to a soft diet such as soft vegetables, chicken, or fish.  If you are able to tolerate that, you can advance to regular diet. If your symptoms do not improve over the next 24 hours, or if you develop new symptoms such as fever, worsening abdominal pain, chills, and continue to have nausea and vomiting, please follow-up in the emergency department for further evaluation. Follow-up as needed.     ED Prescriptions   None    PDMP not reviewed this encounter.   Abran Cantor, NP 02/22/23 1825

## 2023-02-22 NOTE — Discharge Instructions (Signed)
Take medication as prescribed. Increase fluids and allow for plenty of rest.  Try to drink at least 8-10 8 ounce glasses of water if you are able.  If not, recommend the use of Pedialyte or Gatorolyte to prevent dehydration. May take over-the-counter Tylenol as needed for pain, fever, or general discomfort. Recommend a step up diet until your nausea and vomiting improved.  You would start with a clear liquid diet to include soup, broth, popsicles, Jell-O, Sprite, or ginger ale.  Once you are able to tolerate that, you can advance to a soft diet such as soft vegetables, chicken, or fish.  If you are able to tolerate that, you can advance to regular diet. If your symptoms do not improve over the next 24 hours, or if you develop new symptoms such as fever, worsening abdominal pain, chills, and continue to have nausea and vomiting, please follow-up in the emergency department for further evaluation. Follow-up as needed.

## 2023-02-23 LAB — URINE CULTURE

## 2023-04-19 ENCOUNTER — Encounter (HOSPITAL_COMMUNITY): Payer: Self-pay | Admitting: Emergency Medicine

## 2023-04-19 ENCOUNTER — Ambulatory Visit
Admission: RE | Admit: 2023-04-19 | Discharge: 2023-04-19 | Disposition: A | Discharge status: Home / Self Care | Payer: Medicaid Other | Source: Ambulatory Visit | Attending: Nurse Practitioner

## 2023-04-19 ENCOUNTER — Other Ambulatory Visit: Payer: Self-pay

## 2023-04-19 ENCOUNTER — Emergency Department (HOSPITAL_COMMUNITY)
Admission: EM | Admit: 2023-04-19 | Discharge: 2023-04-19 | Disposition: A | Discharge status: Home / Self Care | Payer: Medicaid Other | Attending: Emergency Medicine | Admitting: Emergency Medicine

## 2023-04-19 VITALS — BP 151/89 | HR 80 | Temp 98.3°F | Resp 20

## 2023-04-19 DIAGNOSIS — J45909 Unspecified asthma, uncomplicated: Secondary | ICD-10-CM | POA: Insufficient documentation

## 2023-04-19 DIAGNOSIS — Z7951 Long term (current) use of inhaled steroids: Secondary | ICD-10-CM | POA: Insufficient documentation

## 2023-04-19 DIAGNOSIS — R112 Nausea with vomiting, unspecified: Secondary | ICD-10-CM

## 2023-04-19 DIAGNOSIS — M545 Low back pain, unspecified: Secondary | ICD-10-CM

## 2023-04-19 DIAGNOSIS — R109 Unspecified abdominal pain: Secondary | ICD-10-CM

## 2023-04-19 DIAGNOSIS — N3001 Acute cystitis with hematuria: Secondary | ICD-10-CM

## 2023-04-19 DIAGNOSIS — R1084 Generalized abdominal pain: Secondary | ICD-10-CM

## 2023-04-19 DIAGNOSIS — R1032 Left lower quadrant pain: Secondary | ICD-10-CM

## 2023-04-19 LAB — URINALYSIS, ROUTINE W REFLEX MICROSCOPIC
Bilirubin Urine: NEGATIVE
Glucose, UA: NEGATIVE mg/dL
Ketones, ur: NEGATIVE mg/dL
Nitrite: NEGATIVE
Protein, ur: NEGATIVE mg/dL
Specific Gravity, Urine: 1.021 (ref 1.005–1.030)
pH: 6 (ref 5.0–8.0)

## 2023-04-19 LAB — POCT URINALYSIS DIP (MANUAL ENTRY)
Bilirubin, UA: NEGATIVE
Glucose, UA: NEGATIVE mg/dL
Ketones, POC UA: NEGATIVE mg/dL
Nitrite, UA: POSITIVE — AB
Protein Ur, POC: NEGATIVE mg/dL
Spec Grav, UA: >=1.03 — AB (ref 1.010–1.025)
Urobilinogen, UA: 0.2 E.U./dL
pH, UA: 6 (ref 5.0–8.0)

## 2023-04-19 LAB — COMPREHENSIVE METABOLIC PANEL
ALT: 22 U/L (ref 0–44)
AST: 20 U/L (ref 15–41)
Albumin: 3.4 g/dL — ABNORMAL LOW (ref 3.5–5.0)
Alkaline Phosphatase: 78 U/L (ref 38–126)
Anion gap: 7 (ref 5–15)
BUN: 13 mg/dL (ref 6–20)
CO2: 27 mmol/L (ref 22–32)
Calcium: 8.2 mg/dL — ABNORMAL LOW (ref 8.9–10.3)
Chloride: 105 mmol/L (ref 98–111)
Creatinine, Ser: 0.66 mg/dL (ref 0.44–1.00)
GFR, Estimated: >60 mL/min (ref >60)
Glucose, Bld: 89 mg/dL (ref 70–99)
Potassium: 3.8 mmol/L (ref 3.5–5.1)
Sodium: 139 mmol/L (ref 135–145)
Total Bilirubin: 0.9 mg/dL (ref 0.3–1.2)
Total Protein: 6.5 g/dL (ref 6.5–8.1)

## 2023-04-19 LAB — CBC
HCT: 35.9 % — ABNORMAL LOW (ref 36.0–46.0)
Hemoglobin: 11.7 g/dL — ABNORMAL LOW (ref 12.0–15.0)
MCH: 28.6 pg (ref 26.0–34.0)
MCHC: 32.6 g/dL (ref 30.0–36.0)
MCV: 87.8 fL (ref 80.0–100.0)
Platelets: 205 10*3/uL (ref 150–400)
RBC: 4.09 MIL/uL (ref 3.87–5.11)
RDW: 14.9 % (ref 11.5–15.5)
WBC: 4.7 10*3/uL (ref 4.0–10.5)
nRBC: 0 % (ref 0.0–0.2)

## 2023-04-19 LAB — LIPASE, BLOOD: Lipase: 22 U/L (ref 11–51)

## 2023-04-19 LAB — POCT URINE PREGNANCY: Preg Test, Ur: NEGATIVE

## 2023-04-19 LAB — HCG, QUANTITATIVE, PREGNANCY: hCG, Beta Chain, Quant, S: <1 m[IU]/mL (ref <5)

## 2023-04-19 MED ORDER — CEPHALEXIN 500 MG PO CAPS: 500.0000 mg | ORAL_CAPSULE | Freq: Four times a day (QID) | ORAL | 0 refills | Status: DC

## 2023-04-19 MED ORDER — ONDANSETRON 8 MG PO TBDP
8.0000 mg | ORAL_TABLET | Freq: Once | ORAL | Status: AC
  Administered 2023-04-19: 8 mg via ORAL

## 2023-04-19 MED ORDER — SODIUM CHLORIDE 0.9 % IV BOLUS
1000.0000 mL | Freq: Once | INTRAVENOUS | Status: AC
  Administered 2023-04-19: 1000 mL via INTRAVENOUS

## 2023-04-19 MED ORDER — ONDANSETRON HCL 4 MG/2ML IJ SOLN: 4.0000 mg | Freq: Once | INTRAMUSCULAR | Status: DC | PRN

## 2023-04-19 MED ORDER — ONDANSETRON 4 MG PO TBDP: 4.0000 mg | ORAL_TABLET | Freq: Three times a day (TID) | ORAL | 0 refills | Status: DC | PRN

## 2023-04-19 MED ORDER — KETOROLAC TROMETHAMINE 15 MG/ML IJ SOLN
15.0000 mg | Freq: Once | INTRAMUSCULAR | Status: AC
  Administered 2023-04-19: 15 mg via INTRAVENOUS
  Filled 2023-04-19: qty 1

## 2023-04-19 MED ORDER — PHENAZOPYRIDINE HCL 200 MG PO TABS: 200.0000 mg | ORAL_TABLET | Freq: Three times a day (TID) | ORAL | 0 refills | Status: DC

## 2023-04-19 MED ORDER — SODIUM CHLORIDE 0.9 % IV SOLN
2.0000 g | Freq: Once | INTRAVENOUS | Status: AC
  Administered 2023-04-19: 2 g via INTRAVENOUS
  Filled 2023-04-19: qty 20

## 2023-04-19 NOTE — ED Triage Notes (Signed)
Pt via POV c/o emesis x 3 days with estimated 15 episodes of vomiting. Pt went to UC and was dx with UTI and was advised to come to ED for further eval. Pt reports n/v, lower abdominal pain, left flank pain, frequency, burning urination.

## 2023-04-19 NOTE — ED Provider Notes (Signed)
[facility]    CSN: [id number] Arrival date & time: 04/19/23  0865      History   Chief Complaint Chief Complaint  Patient presents with   Nausea    Entered by patient    HPI Bianca Hayes is a 32 y.o. female.   Patient presents today with 3-day history of left-sided abdominal pain, multiple episodes of vomiting, and fevers/body aches/chills at home.  She reports she is unable to keep anything down.  Reports the abdominal pain is constant and is a 10 out of 10 and states it is worse than labor pain.  Reports appetite has been decreased for the past few days and she has been very tired.  No dysuria, urinary frequency or urgency.  No foul urinary odor, or hematuria.  Reports he has not had a menstrual cycle in the past 2 months and Nexplanon is 1 year out of date.  No diarrhea or blood in the vomit/stool.    Past Medical History:  Diagnosis Date   Anemia    Asthma    GERD (gastroesophageal reflux disease)    H/O cervical fracture    Ovarian cyst    Skull fracture (HCC)     There are no problems to display for this patient.   Past Surgical History:  Procedure Laterality Date   BIOPSY THYROID     DENTAL SURGERY      OB History   No obstetric history on file.      Home Medications    Prior to Admission medications   Medication Sig Start Date End Date Taking? Authorizing Provider  acetaminophen (TYLENOL) 325 MG tablet Take 2 tablets (650 mg total) by mouth every 6 (six) hours as needed. Patient taking differently: Take 650 mg by mouth every 6 (six) hours as needed for mild pain. 02/18/20   Sharman Cheek, MD  brompheniramine-pseudoephedrine-DM 30-2-10 MG/5ML syrup Take 5 mLs by mouth 4 (four) times daily as needed. 07/05/22   Leath-Warren, Sadie Haber, NP  etonogestrel (NEXPLANON) 68 MG IMPL implant 65 mg by Subdermal route once. 03/21/16   [provider]  etonogestrel (NEXPLANON) 68 MG IMPL implant 1 each by Subdermal route once.     [provider]  fluticasone (FLONASE) 50 MCG/ACT nasal spray Place 2 sprays into both nostrils daily. 09/27/22   Leath-Warren, Sadie Haber, NP  ibuprofen (ADVIL) 800 MG tablet Take 1 tablet (800 mg total) by mouth 3 (three) times daily. 04/30/22   Leath-Warren, Sadie Haber, NP  omeprazole (PRILOSEC) 20 MG capsule Take 20 mg by mouth daily as needed.    [provider]  ondansetron (ZOFRAN-ODT) 4 MG disintegrating tablet Take 1 tablet (4 mg total) by mouth every 8 (eight) hours as needed for nausea or vomiting. 07/05/22   Leath-Warren, Sadie Haber, NP  predniSONE (DELTASONE) 10 MG tablet Take 1 tablet (10 mg total) by mouth daily with breakfast. 07/25/22   Dani Gobble, NP  Pseudoeph-Doxylamine-DM-APAP (NYQUIL PO) Take by mouth.    [provider]    Family History Family History  Problem Relation Age of Onset   Thyroid disease Mother    Hypertension Mother    Congestive Heart Failure Mother    COPD Mother    Hyperlipidemia Mother    CAD Mother    Stroke Mother    Heart failure Mother    Heart failure Father    CAD Father    Hyperlipidemia Father    Hypertension Father    Immunodeficiency Father  Hypertension Maternal Grandmother    Diabetes Maternal Grandmother    Hypertension Maternal Grandfather    Diabetes Maternal Grandfather    Hypertension Paternal Grandmother    Diabetes Paternal Grandmother    Hypertension Paternal Grandfather    Diabetes Paternal Grandfather     Social History Social History   Tobacco Use   Smoking status: Never   Smokeless tobacco: Never  Vaping Use   Vaping Use: Never used  Substance Use Topics   Alcohol use: No   Drug use: No     Allergies   Coconut flavor, Iodine, Bactrim [sulfamethoxazole-trimethoprim], and Penicillins   Review of Systems Review of Systems Per HPI  Physical Exam Triage Vital Signs ED Triage Vitals [04/19/23 1011]  Enc Vitals Group     BP (!) 151/89     Pulse Rate 80     Resp 20      Temp 98.3 F (36.8 C)     Temp Source Oral     SpO2 98 %     Weight      Height      Head Circumference      Peak Flow      Pain Score 10     Pain Loc      Pain Edu?      Excl. in GC?    No data found.  Updated Vital Signs BP (!) 151/89 (BP Location: Right Arm)   Pulse 80   Temp 98.3 F (36.8 C) (Oral)   Resp 20   LMP 02/10/2023 (Approximate)   SpO2 98%   Visual Acuity Right Eye Distance:   Left Eye Distance:   Bilateral Distance:    Right Eye Near:   Left Eye Near:    Bilateral Near:     Physical Exam Vitals and nursing note reviewed.  Constitutional:      General: She is not in acute distress.    Appearance: Normal appearance. She is not ill-appearing, toxic-appearing or diaphoretic.  HENT:     Head: Normocephalic and atraumatic.     Mouth/Throat:     Mouth: Mucous membranes are moist.     Pharynx: Oropharynx is clear. No oropharyngeal exudate or posterior oropharyngeal erythema.  Cardiovascular:     Rate and Rhythm: Normal rate and regular rhythm.  Pulmonary:     Effort: Pulmonary effort is normal. No respiratory distress.     Breath sounds: Normal breath sounds. No wheezing, rhonchi or rales.  Abdominal:     General: Abdomen is flat. Bowel sounds are normal. There is no distension.     Palpations: Abdomen is soft.     Tenderness: There is abdominal tenderness. There is left CVA tenderness and guarding (RUQ).  Musculoskeletal:     Cervical back: Normal range of motion.  Lymphadenopathy:     Cervical: No cervical adenopathy.  Skin:    General: Skin is warm and dry.     Capillary Refill: Capillary refill takes less than 2 seconds.     Coloration: Skin is not jaundiced or pale.     Findings: No erythema.  Neurological:     Mental Status: She is alert and oriented to person, place, and time.  Psychiatric:        Behavior: Behavior is cooperative.      UC Treatments / Results  Labs (all labs ordered are listed, but only abnormal results are  displayed) Labs Reviewed  POCT URINALYSIS DIP (MANUAL ENTRY) - Abnormal; Notable for the following components:  Result Value   Clarity, UA hazy (*)    Spec Grav, UA >=1.030 (*)    Blood, UA trace-lysed (*)    Nitrite, UA Positive (*)    Leukocytes, UA Small (1+) (*)    All other components within normal limits  POCT URINE PREGNANCY    EKG   Radiology No results found.  Procedures Procedures (including critical care time)  Medications Ordered in UC Medications  ondansetron (ZOFRAN-ODT) disintegrating tablet 8 mg (8 mg Oral Given 04/19/23 1049)    Initial Impression / Assessment and Plan / UC Course  I have reviewed the triage vital signs and the nursing notes.  Pertinent labs & imaging results that were available during my care of the patient were reviewed by me and considered in my medical decision making (see chart for details).   Patient is hypertensive in triage, otherwise vital signs are stable.  Patient is not ill-appearing.  1. Nausea and vomiting, unspecified vomiting type 2. Generalized abdominal pain 3. Acute left-sided low back pain without sciatica Urinalysis today is negative for pregnancy, positive nitrites Patient has left-sided CVA tenderness, severe abdominal pain I am concerned for pyelonephritis Patient has been unable to tolerate liquids today secondary to vomiting Nausea treated with Zofran 8 mg ODT in urgent care today Given presentation, severe pain, and unable to keep fluids down, I recommended further evaluation and management in emergency room Patient is stable to transport via private vehicle Patient is in agreement to go directly to the emergency room and reports her ride will take her directly there  The patient was given the opportunity to ask questions.  All questions answered to their satisfaction.  The patient is in agreement to this plan.    Final Clinical Impressions(s) / UC Diagnoses   Final diagnoses:  Nausea and vomiting,  unspecified vomiting type  Generalized abdominal pain  Acute left-sided low back pain without sciatica     Discharge Instructions      I am concerned you may have a kidney infection.  Please go directly to the ER for further evaluation and management of your abdominal pain.      ED Prescriptions   None    PDMP not reviewed this encounter.   Valentino Nose, NP 04/19/23 1140

## 2023-04-19 NOTE — ED Notes (Signed)
Patient is being discharged from the Urgent Care and sent to the Emergency Department via POV . Per NP, patient is in need of higher level of care due to CVA tenderness/emesis/acute abd pain. Patient is aware and verbalizes understanding of plan of care.  Vitals:   04/19/23 1011  BP: (!) 151/89  Pulse: 80  Resp: 20  Temp: 98.3 F (36.8 C)  SpO2: 98%   Pt to wait in room until ride arrives per NP.

## 2023-04-19 NOTE — Discharge Instructions (Addendum)
As we discussed, your labs show that you have a urinary tract infection.  Unfortunately, we were unable to rule out a kidney infection or infected kidney stone or any other acute intra-abdominal pathology of symptoms given that our CT scanner has broken.  I did offer transport to [facility] in [place] to have this scan done, however you have opted to defer this treatment and try antibiotics instead.  I have given you a prescription for Keflex which is an antibiotic for you to take as prescribed in its entirety for management of your symptoms.  Have also given you 1 round of IV antibiotics in the emergency department today.  Please complete your course of antibiotics in its entirety.  I have given you a prescription for Zofran for you to take as prescribed as needed for any nausea or vomiting.  Call your primary care doctor to schedule follow-up appointment.  If your pain does not get better or worsens or you develop a fever or unable to keep down your medications, you need to return immediately for further workup.  Return if development of any new or worsening symptoms.

## 2023-04-19 NOTE — Discharge Instructions (Addendum)
I am concerned you may have a kidney infection.  Please go directly to the ER for further evaluation and management of your abdominal pain.

## 2023-04-19 NOTE — ED Triage Notes (Signed)
Pt reports nausea, emesis, generalized body aches, abd pain, fatigue x3 days.

## 2023-04-19 NOTE — ED Provider Notes (Signed)
EMERGENCY DEPARTMENT AT [facility] Provider Note   CSN: [id number] Arrival date & time: 04/19/23  1125     History  Chief Complaint  Patient presents with   Emesis    Bianca Hayes is a 32 y.o. female.  Patient with history of anemia, asthma, GERD presents today with complaints of abdominal pain. She states that same has been ongoing for the past 3 days. She is having associated nausea and vomiting. Pain is in the left lower quadrant and radiates into her left flank. She is having dysuria without hematuria. No diarrhea, she is having regular bowel movements without hematochezia or melena. Denies any history of similar symptoms previously, no history of abdominal surgeries. Originally went to urgent care and had a negative pregnancy test, UA showed infection. Sent here with concern for pyelonephritis. She denies fevers or chills. No vaginal discharge.   The history is provided by the patient. No language interpreter was used.  Emesis Associated symptoms: abdominal pain        Home Medications Prior to Admission medications   Medication Sig Start Date End Date Taking? Authorizing Provider  acetaminophen (TYLENOL) 325 MG tablet Take 2 tablets (650 mg total) by mouth every 6 (six) hours as needed. Patient taking differently: Take 650 mg by mouth every 6 (six) hours as needed for mild pain. 02/18/20   Sharman Cheek, MD  brompheniramine-pseudoephedrine-DM 30-2-10 MG/5ML syrup Take 5 mLs by mouth 4 (four) times daily as needed. 07/05/22   Leath-Warren, Sadie Haber, NP  etonogestrel (NEXPLANON) 68 MG IMPL implant 65 mg by Subdermal route once. 03/21/16   [provider]  etonogestrel (NEXPLANON) 68 MG IMPL implant 1 each by Subdermal route once.    [provider]  fluticasone (FLONASE) 50 MCG/ACT nasal spray Place 2 sprays into both nostrils daily. 09/27/22   Leath-Warren, Sadie Haber, NP  ibuprofen (ADVIL) 800 MG tablet Take 1 tablet (800 mg  total) by mouth 3 (three) times daily. 04/30/22   Leath-Warren, Sadie Haber, NP  omeprazole (PRILOSEC) 20 MG capsule Take 20 mg by mouth daily as needed.    [provider]  ondansetron (ZOFRAN-ODT) 4 MG disintegrating tablet Take 1 tablet (4 mg total) by mouth every 8 (eight) hours as needed for nausea or vomiting. 07/05/22   Leath-Warren, Sadie Haber, NP  predniSONE (DELTASONE) 10 MG tablet Take 1 tablet (10 mg total) by mouth daily with breakfast. 07/25/22   Dani Gobble, NP  Pseudoeph-Doxylamine-DM-APAP (NYQUIL PO) Take by mouth.    [provider]      Allergies    Coconut flavor, Iodine, Bactrim [sulfamethoxazole-trimethoprim], and Penicillins    Review of Systems   Review of Systems  Gastrointestinal:  Positive for abdominal pain, nausea and vomiting.  Genitourinary:  Positive for flank pain.  All other systems reviewed and are negative.   Physical Exam Updated Vital Signs BP 129/72 (BP Location: Right Arm)   Pulse 76   Temp 98.7 F (37.1 C) (Oral)   Resp 18   Ht 5\' 10"  (1.778 m)   Wt (!) 168.1 kg   LMP 02/10/2023 (Approximate)   SpO2 100%   BMI 53.16 kg/m  Physical Exam Vitals and nursing note reviewed.  Constitutional:      General: She is not in acute distress.    Appearance: Normal appearance. She is normal weight. She is not ill-appearing, toxic-appearing or diaphoretic.  HENT:     Head: Normocephalic and atraumatic.  Cardiovascular:     Rate  and Rhythm: Normal rate.  Pulmonary:     Effort: Pulmonary effort is normal. No respiratory distress.  Abdominal:     General: Abdomen is flat.     Palpations: Abdomen is soft.     Tenderness: There is abdominal tenderness. There is left CVA tenderness. There is no guarding or rebound.     Comments: LLQ TTP  Musculoskeletal:        General: Normal range of motion.     Cervical back: Normal range of motion.  Skin:    General: Skin is warm and dry.  Neurological:     General: No focal deficit  present.     Mental Status: She is alert.  Psychiatric:        Mood and Affect: Mood normal.        Behavior: Behavior normal.     ED Results / Procedures / Treatments   Labs (all labs ordered are listed, but only abnormal results are displayed) Labs Reviewed  COMPREHENSIVE METABOLIC PANEL - Abnormal; Notable for the following components:      Result Value   Calcium 8.2 (*)    Albumin 3.4 (*)    All other components within normal limits  CBC - Abnormal; Notable for the following components:   Hemoglobin 11.7 (*)    HCT 35.9 (*)    All other components within normal limits  URINALYSIS, ROUTINE W REFLEX MICROSCOPIC - Abnormal; Notable for the following components:   APPearance CLOUDY (*)    Hgb urine dipstick LARGE (*)    Leukocytes,Ua MODERATE (*)    Bacteria, UA FEW (*)    All other components within normal limits  LIPASE, BLOOD  HCG, QUANTITATIVE, PREGNANCY    EKG None  Radiology No results found.  Procedures Procedures    Medications Ordered in ED Medications  ondansetron (ZOFRAN) injection 4 mg (has no administration in time range)  cefTRIAXone (ROCEPHIN) 2 g in sodium chloride 0.9 % 100 mL IVPB (has no administration in time range)  sodium chloride 0.9 % bolus 1,000 mL (1,000 mLs Intravenous New Bag/Given 04/19/23 1310)  ketorolac (TORADOL) 15 MG/ML injection 15 mg (15 mg Intravenous Given 04/19/23 1310)    ED Course/ Medical Decision Making/ A&P                             Medical Decision Making Amount and/or Complexity of Data Reviewed Labs: ordered. Radiology: ordered.  Risk Prescription drug management.   This patient is a 32 y.o. female who presents to the ED for concern of abdominal pain, flank pain, this involves an extensive number of treatment options, and is a complaint that carries with it a high risk of complications and morbidity. The emergent differential diagnosis prior to evaluation includes, but is not limited to,  renal artery/vein  embolism/thrombosis, mesenteric ischemia, pyelonephritis, nephrolithiasis, cystitis, biliary colic, pancreatitis, perforated peptic ulcer, appendicitis, diverticulitis, bowel obstruction, Ectopic Pregnancy, PID/TOA, Ovarian cyst, Ovarian torsion  This is not an exhaustive differential.   Past Medical History / Co-morbidities / Social History: history of anemia, asthma, GERD  Additional history: Chart reviewed. Pertinent results include: sent from urgent care after UA was infectious, concern for pyelonephritis  Physical Exam: Physical exam performed. The pertinent findings include: LLQ TTP with left flank TTP.  No rebound or guarding.  Lab Tests: I ordered, and personally interpreted labs.  The pertinent results include:  no leukocytosis, UA infectious. No other acute laboratory findings   Medications:  I ordered medication including rocephin, toradol, fluids, zofran  for UTI, pain, nausea, dehydration. Reevaluation of the patient after these medicines showed that the patient improved. I have reviewed the patients home medicines and have made adjustments as needed.   Disposition: After consideration of the diagnostic results and the patients response to treatment, I feel that emergency department workup does not suggest an emergent condition requiring admission or immediate intervention beyond what has been performed at this time. The plan is: discharge with close return precautions and outpatient follow-up.  Patient was sent here to rule out pyelonephritis versus infected kidney stone, however unfortunately soon after her arrival her CT scanner went down.  I discussed this with the patient and did offer transport to [facility] to have a scan done, however she has refused this.  Patient shows no signs of sepsis, she does not have a white count or any changes in kidney or liver function.  After 1 round of Zofran, she no longer is feeling nauseous. Will send prescription for same.  I have  given her 1 round of IV antibiotics.  Urine culture added as well. Plan to send home with Keflex for UTI with close return precautions given incomplete work-up.  Patient expressed understanding and is in agreement with plan.  Evaluation and diagnostic testing in the emergency department does not suggest an emergent condition requiring admission or immediate intervention beyond what has been performed at this time.  Plan for discharge with close PCP follow-up.  Patient is understanding and amenable with plan, educated on red flag symptoms that would prompt immediate return.  Patient discharged in stable condition.   I discussed this case with my attending physician Dr. Deretha Emory who cosigned this note including patient's presenting symptoms, physical exam, and planned diagnostics and interventions. Attending physician stated agreement with plan or made changes to plan which were implemented.    Final Clinical Impression(s) / ED Diagnoses Final diagnoses:  Left lower quadrant abdominal pain  Acute left flank pain  Nausea and vomiting, unspecified vomiting type  Acute cystitis with hematuria    Rx / DC Orders ED Discharge Orders          Ordered    cephALEXin (KEFLEX) 500 MG capsule  4 times daily        04/19/23 1644    phenazopyridine (PYRIDIUM) 200 MG tablet  3 times daily        04/19/23 1644    ondansetron (ZOFRAN-ODT) 4 MG disintegrating tablet  Every 8 hours PRN        04/19/23 1646          An After Visit Summary was printed and given to the patient.     Vear Clock 04/19/23 1648    Vanetta Mulders, MD 04/21/23 1311

## 2023-04-21 LAB — URINE CULTURE

## 2023-05-04 ENCOUNTER — Emergency Department (HOSPITAL_COMMUNITY): Payer: Self-pay

## 2023-05-04 ENCOUNTER — Emergency Department (HOSPITAL_COMMUNITY)
Admission: EM | Admit: 2023-05-04 | Discharge: 2023-05-05 | Disposition: A | Discharge status: Home / Self Care | Payer: Self-pay | Attending: Emergency Medicine | Admitting: Emergency Medicine

## 2023-05-04 ENCOUNTER — Encounter (HOSPITAL_COMMUNITY): Payer: Self-pay

## 2023-05-04 ENCOUNTER — Other Ambulatory Visit: Payer: Self-pay

## 2023-05-04 DIAGNOSIS — N83202 Unspecified ovarian cyst, left side: Secondary | ICD-10-CM | POA: Insufficient documentation

## 2023-05-04 DIAGNOSIS — J45909 Unspecified asthma, uncomplicated: Secondary | ICD-10-CM | POA: Insufficient documentation

## 2023-05-04 LAB — CBC
HCT: 36.9 % (ref 36.0–46.0)
Hemoglobin: 12 g/dL (ref 12.0–15.0)
MCH: 28 pg (ref 26.0–34.0)
MCHC: 32.5 g/dL (ref 30.0–36.0)
MCV: 86.2 fL (ref 80.0–100.0)
Platelets: 223 10*3/uL (ref 150–400)
RBC: 4.28 MIL/uL (ref 3.87–5.11)
RDW: 14.3 % (ref 11.5–15.5)
WBC: 6.3 10*3/uL (ref 4.0–10.5)
nRBC: 0 % (ref 0.0–0.2)

## 2023-05-04 LAB — LIPASE, BLOOD: Lipase: 22 U/L (ref 11–51)

## 2023-05-04 LAB — COMPREHENSIVE METABOLIC PANEL
ALT: 22 U/L (ref 0–44)
AST: 20 U/L (ref 15–41)
Albumin: 3.7 g/dL (ref 3.5–5.0)
Alkaline Phosphatase: 76 U/L (ref 38–126)
Anion gap: 12 (ref 5–15)
BUN: 9 mg/dL (ref 6–20)
CO2: 25 mmol/L (ref 22–32)
Calcium: 8.4 mg/dL — ABNORMAL LOW (ref 8.9–10.3)
Chloride: 101 mmol/L (ref 98–111)
Creatinine, Ser: 0.76 mg/dL (ref 0.44–1.00)
GFR, Estimated: >60 mL/min (ref >60)
Glucose, Bld: 87 mg/dL (ref 70–99)
Potassium: 3.1 mmol/L — ABNORMAL LOW (ref 3.5–5.1)
Sodium: 138 mmol/L (ref 135–145)
Total Bilirubin: 1 mg/dL (ref 0.3–1.2)
Total Protein: 6.6 g/dL (ref 6.5–8.1)

## 2023-05-04 LAB — URINALYSIS, ROUTINE W REFLEX MICROSCOPIC
Bilirubin Urine: NEGATIVE
Glucose, UA: NEGATIVE mg/dL
Hgb urine dipstick: NEGATIVE
Ketones, ur: NEGATIVE mg/dL
Leukocytes,Ua: NEGATIVE
Nitrite: NEGATIVE
Protein, ur: NEGATIVE mg/dL
Specific Gravity, Urine: 1.018 (ref 1.005–1.030)
pH: 5 (ref 5.0–8.0)

## 2023-05-04 LAB — HCG, SERUM, QUALITATIVE: Preg, Serum: NEGATIVE

## 2023-05-04 LAB — PREGNANCY, URINE: Preg Test, Ur: NEGATIVE

## 2023-05-04 MED ORDER — HYDROCODONE-ACETAMINOPHEN 5-325 MG PO TABS: 1.0000 | ORAL_TABLET | Freq: Once | ORAL | Status: DC

## 2023-05-04 MED ORDER — POTASSIUM CHLORIDE CRYS ER 20 MEQ PO TBCR
40.0000 meq | EXTENDED_RELEASE_TABLET | Freq: Once | ORAL | Status: AC
  Administered 2023-05-04: 40 meq via ORAL
  Filled 2023-05-04: qty 2

## 2023-05-04 MED ORDER — ONDANSETRON 4 MG PO TBDP
8.0000 mg | ORAL_TABLET | Freq: Once | ORAL | Status: AC
  Administered 2023-05-04: 8 mg via ORAL
  Filled 2023-05-04: qty 2

## 2023-05-04 MED ORDER — KETOROLAC TROMETHAMINE 30 MG/ML IJ SOLN
30.0000 mg | Freq: Once | INTRAMUSCULAR | Status: AC
  Administered 2023-05-04: 30 mg via INTRAVENOUS
  Filled 2023-05-04: qty 1

## 2023-05-04 NOTE — ED Notes (Signed)
Patient transported to CT 

## 2023-05-04 NOTE — ED Provider Notes (Signed)
[place] EMERGENCY DEPARTMENT AT [facility] Provider Note   CSN: [id number] Arrival date & time: 05/04/23  1234     History  Chief Complaint  Patient presents with   Nausea   Emesis   Headache   Back Pain   Abdominal Pain   Sore Throat    Bianca Hayes is a 32 y.o. female with a history of goiter, anemia, and GERD presents the ED today with abdominal pain.  She reports that she went to [facility], ED several weeks ago for the same complaint but their CT machine was down so could not get any imaging at the time.  She was not able to go to a different location to get imaging at the time so she was treated for UTI based on her left-sided flank pain symptoms.  At this time, patient reports that her flank pain has been persistent, despite completing the medication.  She denies fever but endorses nausea and vomiting.  She was given a prescription for Zofran when she was last seen in the ED but she reports that this medication is expensive so she only takes it when her nausea is severe.  She denies any pain or difficulty with urination, diarrhea, or constipation. Subsequently, she reports pain to her goiter for the past several days.  She was seen by endocrinology in the past and was told that the mass is benign.  She has not followed up with them since due to not having insurance.  Patient does not have any new or worsening symptoms associated with goiter.  No additional complaints or concerns at this time.    Home Medications Prior to Admission medications   Medication Sig Start Date End Date Taking? Authorizing Provider  acetaminophen (TYLENOL) 325 MG tablet Take 2 tablets (650 mg total) by mouth every 6 (six) hours as needed. Patient taking differently: Take 650 mg by mouth every 6 (six) hours as needed for mild pain. 02/18/20   Sharman Cheek, MD  brompheniramine-pseudoephedrine-DM 30-2-10 MG/5ML syrup Take 5 mLs by mouth 4 (four) times daily as needed. 07/05/22    Leath-Warren, Sadie Haber, NP  cephALEXin (KEFLEX) 500 MG capsule Take 1 capsule (500 mg total) by mouth 4 (four) times daily. 04/19/23   Smoot, Shawn Route, PA-C  etonogestrel (NEXPLANON) 68 MG IMPL implant 65 mg by Subdermal route once. 03/21/16   [provider]  etonogestrel (NEXPLANON) 68 MG IMPL implant 1 each by Subdermal route once.    [provider]  fluticasone (FLONASE) 50 MCG/ACT nasal spray Place 2 sprays into both nostrils daily. 09/27/22   Leath-Warren, Sadie Haber, NP  ibuprofen (ADVIL) 800 MG tablet Take 1 tablet (800 mg total) by mouth 3 (three) times daily. 04/30/22   Leath-Warren, Sadie Haber, NP  omeprazole (PRILOSEC) 20 MG capsule Take 20 mg by mouth daily as needed.    [provider]  ondansetron (ZOFRAN-ODT) 4 MG disintegrating tablet Take 1 tablet (4 mg total) by mouth every 8 (eight) hours as needed for nausea or vomiting. 04/19/23   Smoot, Shawn Route, PA-C  phenazopyridine (PYRIDIUM) 200 MG tablet Take 1 tablet (200 mg total) by mouth 3 (three) times daily. 04/19/23   Smoot, Shawn Route, PA-C  predniSONE (DELTASONE) 10 MG tablet Take 1 tablet (10 mg total) by mouth daily with breakfast. 07/25/22   Dani Gobble, NP  Pseudoeph-Doxylamine-DM-APAP (NYQUIL PO) Take by mouth.    [provider]      Allergies    Coconut flavor,  Iodine, Bactrim [sulfamethoxazole-trimethoprim], and Penicillins    Review of Systems   Review of Systems  Gastrointestinal:  Positive for abdominal pain, nausea and vomiting.  Genitourinary:  Positive for flank pain.  All other systems reviewed and are negative.   Physical Exam Updated Vital Signs BP (!) 102/91 (BP Location: Right Arm)   Pulse 76   Temp 98.8 F (37.1 C)   Resp 16   LMP 02/10/2023 (Approximate)   SpO2 100%  Physical Exam Vitals and nursing note reviewed.  Constitutional:      General: She is not in acute distress.    Appearance: Normal appearance.  HENT:     Head: Normocephalic and atraumatic.      Nose: Nose normal.     Mouth/Throat:     Mouth: Mucous membranes are moist.     Pharynx: Oropharynx is clear. No oropharyngeal exudate or posterior oropharyngeal erythema.  Eyes:     Conjunctiva/sclera: Conjunctivae normal.     Pupils: Pupils are equal, round, and reactive to light.  Neck:     Comments: Goiter present at the right side of the neck Cardiovascular:     Rate and Rhythm: Normal rate and regular rhythm.     Pulses: Normal pulses.  Pulmonary:     Effort: Pulmonary effort is normal.     Breath sounds: Normal breath sounds.  Abdominal:     Palpations: Abdomen is soft.     Tenderness: There is abdominal tenderness. There is left CVA tenderness.     Comments: LLQ tenderness  Musculoskeletal:     Cervical back: Normal range of motion. No rigidity.  Lymphadenopathy:     Cervical: No cervical adenopathy.  Skin:    General: Skin is warm and dry.     Findings: No rash.  Neurological:     General: No focal deficit present.     Mental Status: She is alert.  Psychiatric:        Mood and Affect: Mood normal.        Behavior: Behavior normal.     ED Results / Procedures / Treatments   Labs (all labs ordered are listed, but only abnormal results are displayed) Labs Reviewed  COMPREHENSIVE METABOLIC PANEL - Abnormal; Notable for the following components:      Result Value   Potassium 3.1 (*)    Calcium 8.4 (*)    All other components within normal limits  LIPASE, BLOOD  CBC  URINALYSIS, ROUTINE W REFLEX MICROSCOPIC  HCG, SERUM, QUALITATIVE  PREGNANCY, URINE    EKG None  Radiology US PELVIC COMPLETE W TRANSVAGINAL AND TORSION R/O  Result Date: 05/04/2023 CLINICAL DATA:  Left lower quadrant pain x2 weeks. EXAM: TRANSABDOMINAL AND TRANSVAGINAL ULTRASOUND OF PELVIS DOPPLER ULTRASOUND OF OVARIES TECHNIQUE: Both transabdominal and transvaginal ultrasound examinations of the pelvis were performed. Transabdominal technique was performed for global imaging of the pelvis  including uterus, ovaries, adnexal regions, and pelvic cul-de-sac. It was necessary to proceed with endovaginal exam following the transabdominal exam to visualize the uterus, endometrium, bilateral ovaries and bilateral adnexa. Color and duplex Doppler ultrasound was utilized to evaluate blood flow to the ovaries. COMPARISON:  September 01, 2021 FINDINGS: Uterus Measurements: 8.7 cm x 4.6 cm x 5.5 cm = volume: 114.5 mL. No fibroids or other mass visualized. Endometrium Thickness: 6.6 mm.  No focal abnormality visualized. Right ovary Normal right ovarian tissue is not clearly identified. A 16.9 cm x 11.4 cm x 19.2 cm thin walled right adnexal cyst is seen. This is  increased in size and similar in appearance to the cystic structure seen within this region on the prior study. Some internal blood flow is again noted within the peripheral wall of this lesion. Left ovary Measurements: 4.2 cm x 2.7 cm x 4.0 cm = volume: 23.6 mL. Small simple cysts are seen within the left ovary (ultrasonographic measurements not provided). Pulsed Doppler evaluation of the LEFT ovary demonstrates normal low-resistance arterial and venous waveforms. Other findings A trace amount of left adnexal free fluid is seen. IMPRESSION: 1. Large, likely benign, thin walled right adnexal cyst, increased in size when compared to the prior study. 2. Simple left ovarian cysts. Electronically Signed   By: Aram Candela M.D.   On: 05/04/2023 22:54   CT Renal Stone Study  Result Date: 05/04/2023 CLINICAL DATA:  Abdominal and flank pain, stone suspected * Tracking Code: BO * EXAM: CT ABDOMEN AND PELVIS WITHOUT CONTRAST TECHNIQUE: Multidetector CT imaging of the abdomen and pelvis was performed following the standard protocol without IV contrast. RADIATION DOSE REDUCTION: This exam was performed according to the departmental dose-optimization program which includes automated exposure control, adjustment of the mA and/or kV according to patient size and/or  use of iterative reconstruction technique. COMPARISON:  02/18/2020 FINDINGS: Lower chest: No acute abnormality. Hepatobiliary: No solid liver abnormality is seen. Hepatomegaly, maximum coronal span 20.6 cm. Hepatic steatosis. No gallstones, gallbladder wall thickening, or biliary dilatation. Pancreas: Unremarkable. No pancreatic ductal dilatation or surrounding inflammatory changes. Spleen: Splenomegaly, maximum coronal span 17.0 cm. Adrenals/Urinary Tract: Adrenal glands are unremarkable. Kidneys are normal, without renal calculi, solid lesion, or hydronephrosis. Bladder is unremarkable. Stomach/Bowel: Stomach is within normal limits. Appendix appears normal. No evidence of bowel wall thickening, distention, or inflammatory changes. Vascular/Lymphatic: No significant vascular findings are present. Unchanged, enlarged right retroperitoneal lymph nodes, measuring up to 1.3 x 1.2 cm (series 5, image 55). Unchanged prominent bilateral inguinal lymph nodes Reproductive: Large intermediate attenuation multiloculated cystic lesion which appears to arise from the left ovary, measuring at least 18.8 x 17.5 x 12.4 cm in total (series 5, image 87, series 7, image 158). Dominant component, extending well into the low abdomen, appears to contain a solid mass of the left aspect, measuring 5.0 x 4.5 cm (series 5, image 88). Right ovary is not clearly visualized. Other: No abdominal wall hernia or abnormality. No ascites. Musculoskeletal: No acute or significant osseous findings. IMPRESSION: 1. Large intermediate attenuation multiloculated cystic lesion which appears to arise from the left ovary, measuring at least 18.8 x 17.5 x 12.4 cm in total. Dominant component, extending well into the low abdomen, appears to contain a solid mass at the left aspect. Findings are highly concerning for ovarian neoplasm. Normal right ovary is not visualized. 2. Unchanged, enlarged right retroperitoneal lymph nodes. Although location is generally  suspicious for nodal metastatic disease arising from a right ovarian origin, these are nonetheless most likely benign and incidental given stability over a long period of time. No other evidence of lymphadenopathy in the abdomen or pelvis. 3. Hepatomegaly and hepatic steatosis.  Splenomegaly. Electronically Signed   By: Jearld Lesch M.D.   On: 05/04/2023 18:59    Procedures Procedures: not indicated.   Medications Ordered in ED Medications  ondansetron (ZOFRAN-ODT) disintegrating tablet 8 mg (8 mg Oral Given 05/04/23 1848)  ketorolac (TORADOL) 30 MG/ML injection 30 mg (30 mg Intravenous Given 05/04/23 2043)  potassium chloride SA (KLOR-CON M) CR tablet 40 mEq (40 mEq Oral Given 05/04/23 2343)    ED Course/ Medical  Decision Making/ A&P                             Medical Decision Making Amount and/or Complexity of Data Reviewed Labs: ordered. Radiology: ordered.  Risk Prescription drug management.   This patient presents to the ED for concern of abdominal pain, this involves an extensive number of treatment options, and is a complaint that carries with it a high risk of complications and morbidity.   Differential diagnosis includes: Kidney stone versus pyelonephritis versus UTI versus IUP versus ectopic pregnancy, etc.  Co morbidities that complicate the patient evaluation  [facility] Goiter Asthma GERD   Additional history obtained:  Additional history obtained from patient's records.   Lab Tests:  I ordered and personally interpreted labs.  The pertinent results include:   Potassium of 3.1 otherwise CMP within normal limits CBC, lipase, and UA are unremarkable Urine pregnancy is negative.   Imaging Studies ordered:  I ordered imaging studies including CT renal stone study, which showed: 1. Large intermediate attenuation multiloculated cystic lesion which appears to arise from the left ovary, measuring at least 18.8 x 17.5 x 12.4 cm in total. Dominant component, extending  well into the low abdomen, appears to contain a solid mass at the left aspect. Findings are highly concerning for ovarian neoplasm. Normal right ovary is not visualized. 2. Unchanged, enlarged right retroperitoneal lymph nodes. Although location is generally suspicious for nodal metastatic disease arising from a right ovarian origin, these are nonetheless most likely benign and incidental given stability over a long period of time. No other evidence of LAD in the abdomen or pelvis. 3. Hepatomegaly and hepatic steatosis.  Splenomegaly. I ordered a pelvic ultrasound which showed: 1. Large, likely benign, thin walled right adnexal cyst, increased in size when compared to the prior study. 2. Simple left ovarian cysts. I agree with the radiologist interpretation   Problem List / ED Course / Critical interventions / Medication management  Abdominal pain, headache, nausea vomiting, neck pain I ordered medications including: Zofran for N/V Toradol for pain Oral potassium for hypokalemia  Reevaluation of the patient after these medicines showed that the patient improved I have reviewed the patients home medicines and have made adjustments as needed   Social Determinants of Health:  Access to healthcare   Test / Admission - Considered:  Discussed results with patient.  She is stable and safe for discharge home. Referral for OB/GYN provided for further evaluation and management.  Take Ibuprofen OTC for pain. Advised to follow up with endocrinology for concern of new onset goiter pain. Return precautions given.       Final Clinical Impression(s) / ED Diagnoses Final diagnoses:  Cyst of left ovary    Rx / DC Orders ED Discharge Orders          Ordered    Ambulatory referral to Obstetrics / Gynecology       Comments: Large left sided ovarian cyst   05/04/23 2333              Maxwell Marion, PA-C 05/04/23 2350    Pricilla Loveless, MD 05/05/23 2137

## 2023-05-04 NOTE — ED Triage Notes (Signed)
Pt. Stated, Bianca Hayes had stomach pain with N/V ,headache, and sore throat for 2 weeks. I went to [facility] and they gave me antibiotic and Zofran. My symptoms are not any better and I need something to help me.

## 2023-05-04 NOTE — Discharge Instructions (Addendum)
As discussed you have a large left ovarian cyst that is pressing on your lower abdomen which is most likely causing your pain.  Take Ibuprofen 800 mg as needed for pain. You can also use Lidoderm patches for pain relief.  Referral has been sent to the OB/GYN in [place] to follow-up with.  They should call you within the next several days schedule an appointment.  Also, as I mentioned. You can try OTC Pantoprazole or Omeprazole to see if this helps with your nausea and vomiting. Take the medication once a day in the morning for 2 weeks.  Get help right away if: You have pain in your belly that is very bad or gets worse. You have pain in the area between your hip bones, and the pain is very bad or gets worse. You cannot eat or drink without vomiting. You get a fever or chills all of a sudden. Your period is a lot heavier than usual.

## 2023-05-04 NOTE — ED Provider Triage Note (Signed)
Emergency Medicine Provider Triage Evaluation Note  Bianca Hayes , a 32 y.o. female  was evaluated in triage.  Pt complains of left lower quadrant abdominal pain.  Symptoms have been present for the past 2 to 3 weeks.  She initially presented to an urgent care in [place] and was referred to the emergency department.  She states the emergency department at [facility] did not have CT imaging capabilities at that time.  She was diagnosed with a kidney infection and was given antibiotics and Zofran.  She states her urinary symptoms have resolved but the pain is still present.  She denies any fevers or chills.  She is also complaining of a mass to her thyroid.  This is been present for some time.  She has a burning sensation when she swallows  Review of Systems  Positive: As above Negative: As above  Physical Exam  BP (!) 142/91   Pulse 90   Temp 98.3 F (36.8 C) (Oral)   Resp 18   LMP 02/10/2023 (Approximate)   SpO2 99%  Gen:   Awake, no distress   Resp:  Normal effort  MSK:   Moves extremities without difficulty  Other:  Mild left CVA tenderness  Medical Decision Making  Medically screening exam initiated at 1:56 PM.  Appropriate orders placed.  Bianca Hayes was informed that the remainder of the evaluation will be completed by another provider, this initial triage assessment does not replace that evaluation, and the importance of remaining in the ED until their evaluation is complete.  Workup initiated   Michelle Piper, Cordelia Poche 05/04/23 1357

## 2023-05-05 NOTE — ED Notes (Signed)
Discharge instructions reviewed with patient. Patient questions answered and opportunity for education reviewed. Patient voices understanding of discharge instructions with no further questions. Patient ambulatory with steady gait to lobby.  

## 2023-05-06 ENCOUNTER — Telehealth: Payer: Self-pay | Admitting: Women's Health

## 2023-05-14 ENCOUNTER — Encounter: Payer: Medicaid Other | Admitting: Adult Health

## 2023-05-30 DIAGNOSIS — Z419 Encounter for procedure for purposes other than remedying health state, unspecified: Secondary | ICD-10-CM | POA: Diagnosis not present

## 2023-06-05 ENCOUNTER — Encounter: Payer: Self-pay | Admitting: Obstetrics & Gynecology

## 2023-06-05 ENCOUNTER — Ambulatory Visit (INDEPENDENT_AMBULATORY_CARE_PROVIDER_SITE_OTHER): Payer: Medicaid Other | Admitting: Obstetrics & Gynecology

## 2023-06-05 ENCOUNTER — Other Ambulatory Visit: Payer: Self-pay | Admitting: Nurse Practitioner

## 2023-06-05 ENCOUNTER — Telehealth: Payer: Self-pay | Admitting: Nurse Practitioner

## 2023-06-05 ENCOUNTER — Other Ambulatory Visit (HOSPITAL_COMMUNITY)
Admission: RE | Admit: 2023-06-05 | Discharge: 2023-06-05 | Disposition: A | Discharge status: Home / Self Care | Payer: Medicaid Other | Source: Ambulatory Visit | Attending: Adult Health | Admitting: Adult Health

## 2023-06-05 VITALS — BP 139/85 | HR 86 | Ht 69.0 in | Wt 368.0 lb

## 2023-06-05 DIAGNOSIS — E049 Nontoxic goiter, unspecified: Secondary | ICD-10-CM

## 2023-06-05 DIAGNOSIS — Z01411 Encounter for gynecological examination (general) (routine) with abnormal findings: Secondary | ICD-10-CM

## 2023-06-05 DIAGNOSIS — Z30016 Encounter for initial prescription of transdermal patch hormonal contraceptive device: Secondary | ICD-10-CM | POA: Diagnosis not present

## 2023-06-05 DIAGNOSIS — E041 Nontoxic single thyroid nodule: Secondary | ICD-10-CM

## 2023-06-05 DIAGNOSIS — Z124 Encounter for screening for malignant neoplasm of cervix: Secondary | ICD-10-CM | POA: Insufficient documentation

## 2023-06-05 DIAGNOSIS — Z3046 Encounter for surveillance of implantable subdermal contraceptive: Secondary | ICD-10-CM

## 2023-06-05 DIAGNOSIS — Z6841 Body Mass Index (BMI) 40.0 and over, adult: Secondary | ICD-10-CM | POA: Diagnosis not present

## 2023-06-05 DIAGNOSIS — N83201 Unspecified ovarian cyst, right side: Secondary | ICD-10-CM

## 2023-06-05 DIAGNOSIS — R7989 Other specified abnormal findings of blood chemistry: Secondary | ICD-10-CM

## 2023-06-05 MED ORDER — NORELGESTROMIN-ETH ESTRADIOL 150-35 MCG/24HR TD PTWK
1.0000 | MEDICATED_PATCH | TRANSDERMAL | 12 refills | Status: DC
Start: 2023-06-05 — End: 2024-03-18

## 2023-06-05 NOTE — Telephone Encounter (Signed)
ok 

## 2023-06-05 NOTE — Telephone Encounter (Signed)
Pt said she did not have insurance when they called, she has medicaid now. She will see you Friday at 11:30

## 2023-06-05 NOTE — Telephone Encounter (Signed)
I had referred her to Dr. Gerrit Friends to consult on surgical removal of her thyroid due to the size and compressive symptoms she was experiencing.  Did she ever go through with the consult?  I can order labs to be done in the interim.

## 2023-06-05 NOTE — Telephone Encounter (Signed)
I called pt to see, no answer I left a VM. If she calls back, do you want to see her with labs or her call Gerkin's office?

## 2023-06-05 NOTE — Telephone Encounter (Signed)
It would cut out the middle man by going straight to them, but if she prefers, we can see here first.

## 2023-06-05 NOTE — Telephone Encounter (Signed)
Pt's provider sent a new referral to [place], she needs lab orders placed then I will make her a f/u

## 2023-06-05 NOTE — Progress Notes (Signed)
WELL-WOMAN EXAMINATION Patient name: Bianca Hayes MRN [id number]  Date of birth: 22-Feb-1991 Chief Complaint:   Contraception (Nexplanon insertion, needs pap)  History of Present Illness:   Bianca Hayes is a 32 y.o. G30P3 female being seen today for a routine well-woman exam and the following concerns:  -Right ovarian cyst: This has been an ongoing issue for a few yrs; however, due to insurance she was not able to follow up sooner.  Recent US 04/2023: A 16.9 cm x 11.4 cm x 19.2 cm thin walled right adnexal cyst is seen. This is increased in size and similar in appearance to the cystic structure seen within this region on the prior study.  Prior study 2022.  She does note some lower abdominal pain- intermittent, most bothersome the past few mos.  Denies nausea/vomiting.  No acute changes in her pain.  Menses are irregular typically last 3-4 days and not every month- maybe every few months.  The months she does not have a period still has dysmenorrhea.  Nexplanon place >51yrs ago- will plan to remove today- device expired Does not want a pregnancy currently, but maybe in the future.  Wishes to review contraceptive options  No LMP recorded. Patient has had an implant.  The current method of family planning is  Nexplanon .    Last pap unsure.  Last mammogram: NA. Last colonoscopy: NA      No data to display            Review of Systems:   Pertinent items are noted in HPI Denies any headaches, blurred vision, fatigue, shortness of breath, chest pain.  Pertinent History Reviewed:  Reviewed past medical,surgical, social and family history.  Reviewed problem list, medications and allergies. Physical Assessment:   Vitals:   06/05/23 1045  BP: 139/85  Pulse: 86  Weight: (!) 368 lb (166.9 kg)  Height: 5\' 9"  (1.753 m)  Body mass index is 54.34 kg/m.        Physical Examination:   General appearance - well appearing, and in no distress  Mental status - alert,  oriented to person, place, and time  Psych:  She has a normal mood and affect  Skin - warm and dry, normal color, no suspicious lesions noted  Chest - effort normal, all lung fields clear to auscultation bilaterally  Heart - normal rate and regular rhythm  Neck:  enlarge thyroid mass- most noticeable on right side  Breasts - breasts appear normal, no suspicious masses, under right breast irregular slightly thickened pink rash noted- suggestive of intertrigo  Abdomen - obese, soft, some tenderness noted R>L, no rebound or guarding  Pelvic - VULVA: normal appearing vulva with no masses, tenderness or lesions  VAGINA: normal appearing vagina with normal color and discharge, no lesions  CERVIX: normal appearing cervix without discharge or lesions, no CMT  Thin prep pap is done with HR HPV cotesting Bimanual exam limited due to body habitus- fullness appreciated with some tenderness on right side  Extremities:  No swelling or varicosities noted  Chaperone: Faith Rogue     Assessment & Plan:  1) Well-Woman Exam -pap collected, reviewed screening guidelines  2) Ovarian cyst -reassured pt and family that this is likely a benign finding -discussed conservative vs surgical management -reviewed risk/benefit of surgical intervention and discussed surgical process -at this time, pt desires to monitor -plan to repeat US in 2mos -reviewed ovarian torsion precautions and discussed that based on size (though slow growing) may not decrease/resolve  on its own -questions/concerns were addressed  3) Contraceptive management, Nexplanon removal OCP risk assessment: Pt denies personal history of VTE, stroke or heart attack.  Denies personal h/o breast cancer.  Pt is either a non-smoker or smoker under the age of 32yo.  Denies h/o migraines with aura -plan to start patch, f/u in 2-3 mos -due to BMI, weight >300lbs advised patch + condoms -Nexplanon device removed as above  4) Enlarged thyroid -pt is  aware and plans to follow up -referral created -last lab work 2023- suggestive of hyperthyroid  -Morbid obesity -Intertrigo  Orders Placed This Encounter  Procedures   US PELVIC COMPLETE WITH TRANSVAGINAL   Ambulatory referral to Endocrinology    Meds:  Meds ordered this encounter  Medications   norelgestromin-ethinyl estradiol [facility]) 150-35 MCG/24HR transdermal patch    Sig: Place 1 patch onto the skin once a week.    Dispense:  3 patch    Refill:  12    Follow-up: Return in about 2 months (around 08/05/2023) for ovarian cyst follow, med follow, [place] in 2 mos.   Myna Hidalgo, DO Attending Obstetrician & Gynecologist, [facility] for [employer], [facility]

## 2023-06-06 DIAGNOSIS — R7989 Other specified abnormal findings of blood chemistry: Secondary | ICD-10-CM | POA: Diagnosis not present

## 2023-06-06 DIAGNOSIS — E041 Nontoxic single thyroid nodule: Secondary | ICD-10-CM | POA: Diagnosis not present

## 2023-06-06 DIAGNOSIS — E049 Nontoxic goiter, unspecified: Secondary | ICD-10-CM | POA: Diagnosis not present

## 2023-06-07 ENCOUNTER — Encounter: Payer: Self-pay | Admitting: Nurse Practitioner

## 2023-06-07 ENCOUNTER — Ambulatory Visit (INDEPENDENT_AMBULATORY_CARE_PROVIDER_SITE_OTHER): Payer: Medicaid Other | Admitting: Nurse Practitioner

## 2023-06-07 VITALS — BP 126/84 | HR 85 | Ht 69.0 in | Wt 370.6 lb

## 2023-06-07 DIAGNOSIS — R7989 Other specified abnormal findings of blood chemistry: Secondary | ICD-10-CM | POA: Diagnosis not present

## 2023-06-07 DIAGNOSIS — E041 Nontoxic single thyroid nodule: Secondary | ICD-10-CM

## 2023-06-07 DIAGNOSIS — J398 Other specified diseases of upper respiratory tract: Secondary | ICD-10-CM

## 2023-06-07 DIAGNOSIS — E049 Nontoxic goiter, unspecified: Secondary | ICD-10-CM | POA: Diagnosis not present

## 2023-06-07 NOTE — Progress Notes (Signed)
Endocrinology Follow Up Note 06/07/23    ---------------------------------------------------------------------------------------------------------------------- Subjective    Past Medical History:  Diagnosis Date   Anemia    Asthma    GERD (gastroesophageal reflux disease)    H/O cervical fracture    Ovarian cyst    Skull fracture (HCC)     Past Surgical History:  Procedure Laterality Date   BIOPSY THYROID     DENTAL SURGERY      Social History   Socioeconomic History   Marital status: Divorced    Spouse name: Not on file   Number of children: Not on file   Years of education: Not on file   Highest education level: Not on file  Occupational History   Not on file  Tobacco Use   Smoking status: Never   Smokeless tobacco: Never  Vaping Use   Vaping status: Never Used  Substance and Sexual Activity   Alcohol use: No   Drug use: No   Sexual activity: Yes    Birth control/protection: None    Comment: has expired Nexplanon in place  Other Topics Concern   Not on file  Social History Narrative   Not on file   Social Determinants of Health   Financial Resource Strain: Not on file  Food Insecurity: Not on file  Transportation Needs: Not on file  Physical Activity: Not on file  Stress: Not on file  Social Connections: Not on file  Intimate Partner Violence: Not on file    Current Outpatient Medications on File Prior to Visit  Medication Sig Dispense Refill   acetaminophen (TYLENOL) 500 MG tablet Take 500 mg by mouth every 6 (six) hours as needed. As needed     ibuprofen (ADVIL) 200 MG tablet Take 200 mg by mouth every 6 (six) hours as needed. Patient will take 2 by mouth as needed for pain     omeprazole (PRILOSEC) 20 MG capsule Take 20 mg by mouth daily as needed.     ondansetron (ZOFRAN-ODT) 4 MG disintegrating tablet Take 1 tablet (4 mg total) by mouth every 8 (eight) hours as needed for nausea or vomiting. 20 tablet 0   norelgestromin-ethinyl estradiol  [employer]r [employer]ico) 150-35 MCG/24HR transdermal patch Place 1 patch onto the skin once a week. (Patient not taking: Reported on 06/07/2023) 3 patch 12   No current facility-administered medications on file prior to visit.      HPI   Bianca Hayes is a 32 y.o.-year-old female, referred by the hospitalist, for evaluation for thyroid nodule found after CT neck with contrast during recent hospitalization.  They originally thought it was oral infection but that was ruled out.    I reviewed pt's thyroid tests: Lab Results  Component Value Date   TSH 0.048 (L) 06/06/2023   TSH 0.013 (L) 07/25/2022   TSH 0.008 (L) 06/11/2022   TSH 0.010 (L) 05/23/2022   FREET4 1.07 06/06/2023   FREET4 1.32 07/25/2022   FREET4 1.45 06/11/2022   FREET4 1.05 05/23/2022     Pt complains of: - feeling nodules in neck - hoarseness - dysphagia - choking - SOB with lying down - anxiety - palpitations - nausea  Pt denies: - fatigue - heat intolerance/cold intolerance - tremors - dry skin - hair loss   She does have family history of hypothyroidism in her mother.  No FH of thyroid cancer. No h/o radiation tx to head or neck.  No seaweed or kelp. No recent contrast studies. No steroid use. No herbal supplements. No Biotin supplements  or Hair, Skin and Nails vitamins.    Review of systems  Constitutional: + steadily increasing body weight,  current Body mass index is 54.73 kg/m. , + fatigue, no subjective hyperthermia, no subjective hypothermia Eyes: no blurry vision, no xerophthalmia ENT: + sore throat, + nodules palpated in throat, + dysphagia/odynophagia, + hoarseness Cardiovascular: no chest pain, no shortness of breath, + palpitations, no leg swelling Respiratory: no cough, no shortness of breath Gastrointestinal: no vomiting/diarrhea, + nausea Musculoskeletal: no muscle/joint aches Skin: + rash under bilateral breasts, no hyperemia Neurological: no tremors, no numbness, no tingling, no  dizziness Psychiatric: no depression, + anxiety  ---------------------------------------------------------------------------------------------------------------------- Objective    BP 126/84 (BP Location: Left Arm, Patient Position: Sitting, Cuff Size: Large)   Pulse 85   Ht 5\' 9"  (1.753 m)   Wt (!) 370 lb 9.6 oz (168.1 kg)   BMI 54.73 kg/m    BP Readings from Last 3 Encounters:  06/07/23 126/84  06/05/23 139/85  05/05/23 111/74    Wt Readings from Last 3 Encounters:  06/07/23 (!) 370 lb 9.6 oz (168.1 kg)  06/05/23 (!) 368 lb (166.9 kg)  04/19/23 (!) 370 lb 8 oz (168.1 kg)     Physical Exam- Limited  Constitutional:  Body mass index is 54.73 kg/m. , not in acute distress, normal state of mind Eyes:  EOMI, no exophthalmos Neck: Supple Thyroid: ++ right thyroid mass, tender to palpation Cardiovascular: RRR, no murmurs, rubs, or gallops, no edema Respiratory: Adequate breathing efforts, no crackles, rales, rhonchi, or wheezing Musculoskeletal: no gross deformities, strength intact in all four extremities, no gross restriction of joint movements Skin:  + rash under bilateral breasts (yeast), no hyperemia Neurological: no tremor with outstretched hands    CT neck with contrast 05/23/22 CLINICAL DATA:  Initial evaluation for soft tissue swelling, infection suspected.   EXAM: CT NECK WITH CONTRAST   TECHNIQUE: Multidetector CT imaging of the neck was performed using the standard protocol following the bolus administration of intravenous contrast.   RADIATION DOSE REDUCTION: This exam was performed according to the departmental dose-optimization program which includes automated exposure control, adjustment of the mA and/or kV according to patient size and/or use of iterative reconstruction technique.   CONTRAST:  75mL OMNIPAQUE IOHEXOL 300 MG/ML  SOLN   COMPARISON:  Prior radiograph from earlier the same day.   FINDINGS: Pharynx and larynx: Oral cavity within  normal limits. Oropharynx and nasopharynx within normal limits. No retropharyngeal collection or swelling. Negative epiglottis. Hypopharynx and supraglottic larynx within normal limits. Glottis within normal limits. Subglottic airway deviated to the left but remains patent.   Salivary glands: Salivary glands including the parotid glands are within normal limits. Submandibular glands are either hypoplastic or absent, neither which is well visualized.   Thyroid: Heterogeneous predominantly hypodense mass positioned along the lateral margin of the right lobe of thyroid measures approximately 4.5 x 3.1 x 5.5 cm (series 2, image 84). This is favored to reflect an enlarged heterogeneous thyroid nodule. Mild mass effect on the adjacent trachea which is deviated to the left but remains patent. Contralateral left thyroid lobe within normal limits.   Lymph nodes: Mildly prominent level 2 lymph nodes measure at the upper limits of normal at 1 cm in short axis. No overtly enlarged or pathologic adenopathy within the neck.   Vascular: Normal intravascular enhancement seen throughout the neck. Right CCA and right IJ are deviated laterally by the large right thyroid nodule.   Limited intracranial: Unremarkable.   Visualized orbits: Unremarkable.  Mastoids and visualized paranasal sinuses: Visualized paranasal sinuses are clear. Visualized mastoids and middle ear cavities are well pneumatized and free of fluid.   Skeleton: No discrete or worrisome osseous lesions.   Upper chest: Unremarkable.   Other: None.   IMPRESSION: 1. 4.5 x 3.1 x 5.5 cm heterogeneous mass positioned along the lateral margin of the right lobe of thyroid, favored to reflect an enlarged heterogeneous thyroid nodule. Further evaluation with dedicated thyroid ultrasound recommended (ref: J Am Coll Radiol. 2015 Feb;12(2): 143-50). 2. No other acute abnormality within the neck.     Electronically Signed   By:  Rise Mu M.D.   On: 05/23/2022 23:08       Thyroid US from 06/19/22 CLINICAL DATA:  Right thyroid mass   EXAM: THYROID ULTRASOUND   TECHNIQUE: Ultrasound examination of the thyroid gland and adjacent soft tissues was performed.   COMPARISON:  CT soft tissue neck 05/23/2022   FINDINGS: Parenchymal Echotexture: Mildly heterogenous   Isthmus: 0.2 cm   Right lobe: 6.9 x 4.3 x 4.2 cm   Left lobe: 4.3 x 2.2 x 2.0 cm   _________________________________________________________   Estimated total number of nodules >/= 1 cm: 2   Number of spongiform nodules >/=  2 cm not described below (TR1): 0   Number of mixed cystic and solid nodules >/= 1.5 cm not described below (TR2): 0   _________________________________________________________   Nodule # 1:   Location: Right; mid   Maximum size: 5.9 cm; Other 2 dimensions: 4.2 x 4.5 cm   Composition: mixed cystic and solid (1)   Echogenicity: isoechoic (1)   Shape: not taller-than-wide (0)   Margins: smooth (0)   Echogenic foci: none (0)   ACR TI-RADS total points: 2.   ACR TI-RADS risk category: TR2 (2 points).   ACR TI-RADS recommendations:   This nodule does NOT meet TI-RADS criteria for biopsy or dedicated follow-up.   _________________________________________________________   Nodule # 2:   Location: Left; mid   Maximum size: 3.5 cm; Other 2 dimensions: 2.1 x 1.9 cm   Composition: solid/almost completely solid (2)   Echogenicity: hypoechoic (2)   Shape: not taller-than-wide (0)   Margins: ill-defined (0)   Echogenic foci: none (0)   ACR TI-RADS total points: 4.   ACR TI-RADS risk category: TR4 (4-6 points).   ACR TI-RADS recommendations:   **Given size (>/= 1.5 cm) and appearance, fine needle aspiration of this moderately suspicious nodule should be considered based on TI-RADS criteria.   IMPRESSION: 1. Nodule 2 (TI-RADS 4), measuring 3.5 cm, located in the mid left thyroid  lobe, meets criteria for FNA. 2. Abnormality seen on recent soft tissue neck CT corresponds to mixed solid cystic right mid thyroid nodule. If this nodule is causing significant symptoms, it is amenable to ultrasound-guided aspiration.   The above is in keeping with the ACR TI-RADS recommendations - J Am Coll Radiol 2017;14:587-595.     Electronically Signed   By: Acquanetta Belling M.D.   On: 06/20/2022 11:16        Latest Reference Range & Units 05/23/22 20:46 05/23/22 23:50 06/11/22 13:54 07/25/22 14:34 06/06/23 08:42  TSH 0.450 - 4.500 uIU/mL 0.010 (L)  0.008 (L) 0.013 (L) 0.048 (L)  Triiodothyronine,Free,Serum 2.0 - 4.4 pg/mL  4.8 (H) 5.3 (H) 4.1 4.2  Triiodothyronine (T3) 71 - 180 ng/dL 161 (H)      W9,UEAV(WUJWJX) 0.82 - 1.77 ng/dL  9.14 7.82 9.56 2.13  Thyroxine (T4) 4.5 - 12.0 ug/dL 10.9  Thyroperoxidase Ab SerPl-aCnc 0 - 34 IU/mL   11    Thyroglobulin Antibody 0.0 - 0.9 IU/mL   <1.0    (L): Data is abnormally low (H): Data is abnormally high  ----------------------------------------------------------------------------------------------------------------------  ASSESSMENT / PLAN:  1. Goiter 2. Subclinical Hyperthyroidism 3. Thyroid nodule  She was seen in the ED for mass on her right side of her neck and a thyroid nodule was incidentally seen on the CT scan of her neck.  Her TSH was also slightly suppressed at that time and had mildly elevated Free T3.  Her repeat thyroid function tests show mildly suppressed TSH and normal FT4 and FT3 values.  She does not require any thyroid hormone replacement or antithyroid treatment at this time.  Her thyroid ultrasound from 06/11/22 shows MNG recommending FNA of moderately suspicious nodule.  This was done and sent to [facility] for further testing.  Pathology results favor benignity.    She does have significant neck compression symptoms and pain related to the sheer size of her thyroid gland, therefore I did recommend surgical  consult for total thyroidectomy.  She did not follow through with the surgical consult due to financial restraints but now that she has health insurance she is willing to proceed with surgical consult for total thyroidectomy given her significant compressive symptoms.  I am hoping that she will not need another [place] prior to surgery, trying to conserve cost for patient moving forward, but patient knows I will reach out if the surgeon will require it.  - I did explain that, while thyroid surgery is not a complicated one, it still can have side effects and also she will mostly likely become hypothyroidism s/p surgery which would require thyroid hormone supplementation for the rest of her life.   - I did recommend she use [place] medicated powder under her breasts multiple times per day to help take care of the yeast she has there as a result of too much moisture.  Follow Up Plan: Return will follow up after surgical consult.      I spent  43  minutes in the care of the patient today including review of labs from Thyroid Function, CMP, and other relevant labs ; imaging/biopsy records (current and previous including abstractions from other facilities); face-to-face time discussing  her lab results and symptoms, medications doses, her options of short and long term treatment based on the latest standards of care / guidelines;   and documenting the encounter.  Bianca Hayes  participated in the discussions, expressed understanding, and voiced agreement with the above plans.  All questions were answered to her satisfaction. she is encouraged to contact clinic should she have any questions or concerns prior to her return visit.    Ronny Bacon, [facility] [address] [place], [place] [address] Phone: [phone] Fax: [phone]

## 2023-06-13 ENCOUNTER — Encounter: Payer: Self-pay | Admitting: Nurse Practitioner

## 2023-06-15 IMAGING — US US PELVIS COMPLETE TRANSABD/TRANSVAG W DUPLEX
1 series · 13 of 25 positions shown · non-contrast
Comparison: None.

CLINICAL DATA: Pelvic pain for 2 days.

EXAM:
TRANSABDOMINAL AND TRANSVAGINAL ULTRASOUND OF PELVIS
DOPPLER ULTRASOUND OF OVARIES
TECHNIQUE: Both transabdominal and transvaginal ultrasound examinations of the
pelvis were performed. Transabdominal technique was performed for
global imaging of the pelvis including uterus, ovaries, adnexal
regions, and pelvic cul-de-sac.
It was necessary to proceed with endovaginal exam following the
transabdominal exam to visualize the endometrium and ovaries. Color
and duplex Doppler ultrasound was utilized to evaluate blood flow to
the ovaries.

[Series 1: us pelvic complete w transvaginal and torsion righ · 13 of 96 slices shown]
[im 1/96]
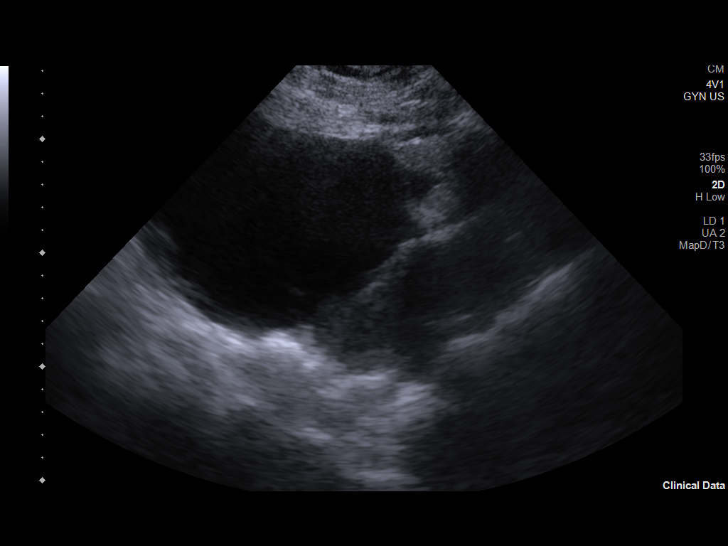
[im 8/96]
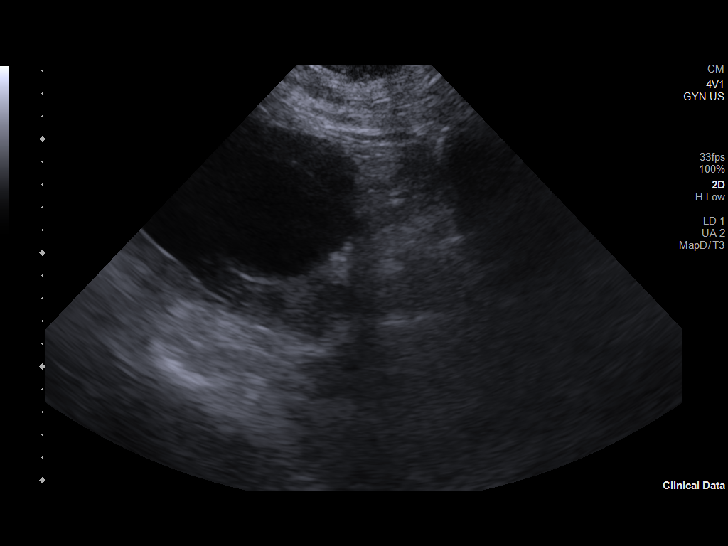
[im 16/96]
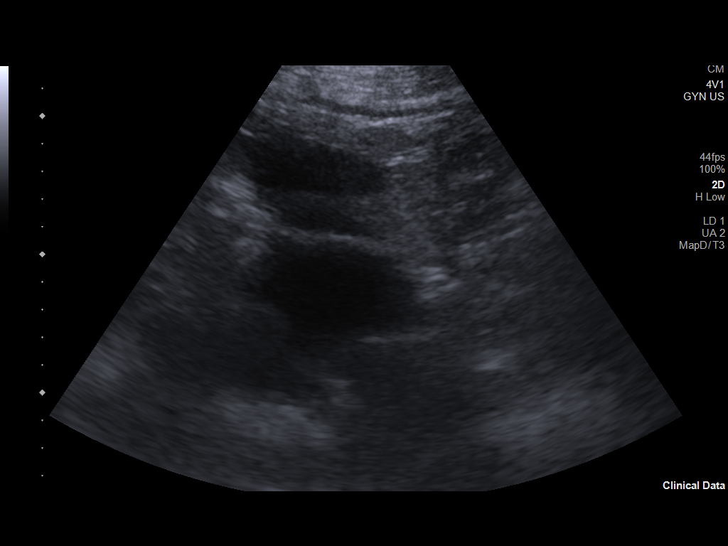
[im 24/96]
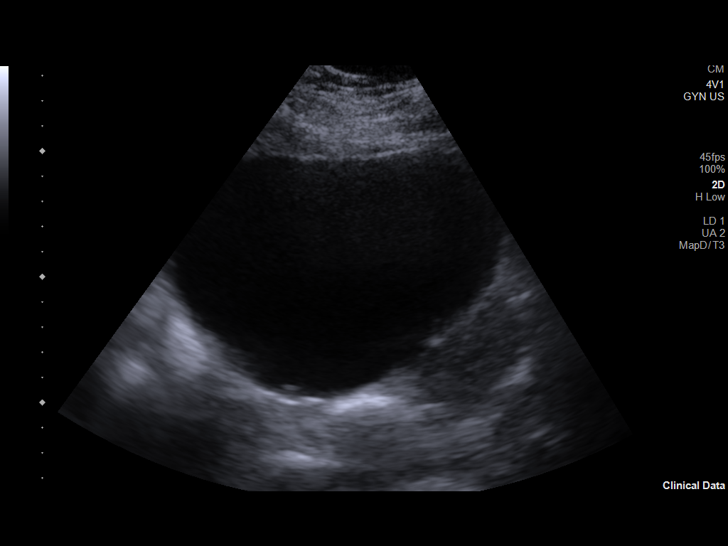
[im 32/96]
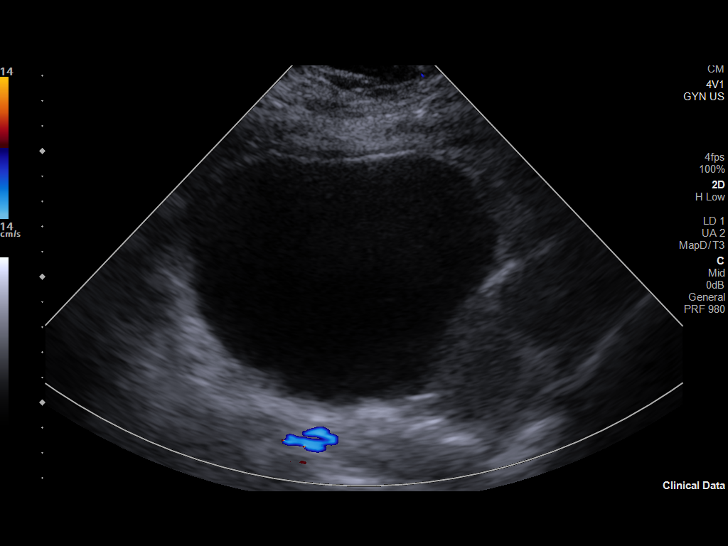
[im 40/96]
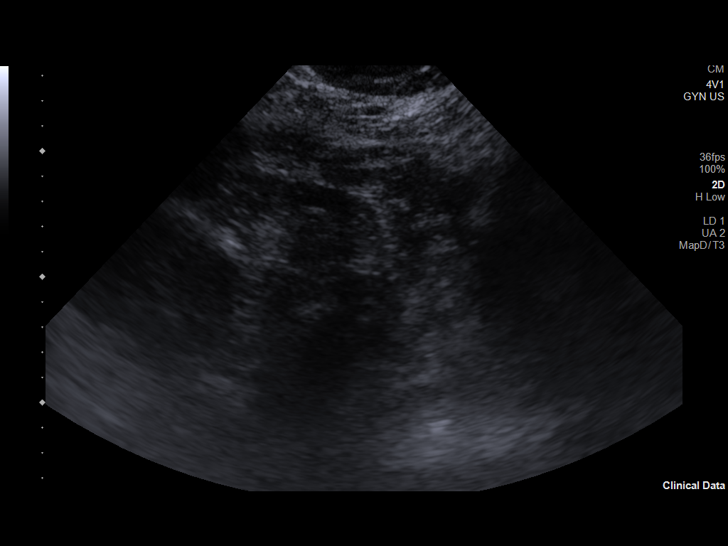
[im 48/96]
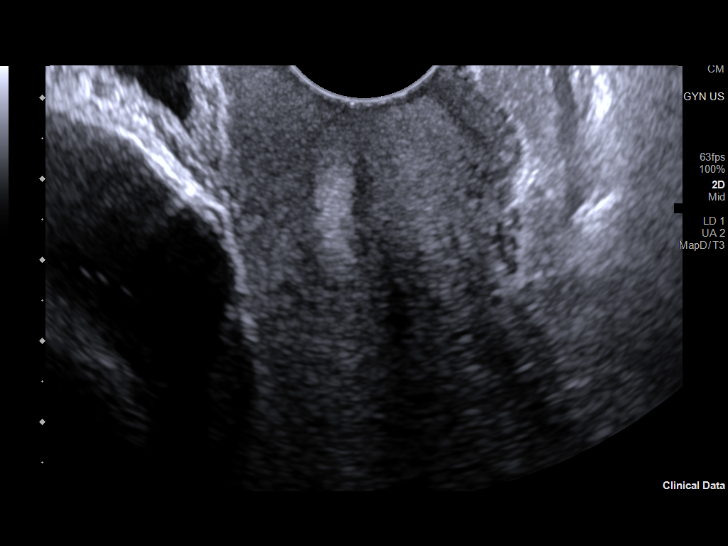
[im 56/96]
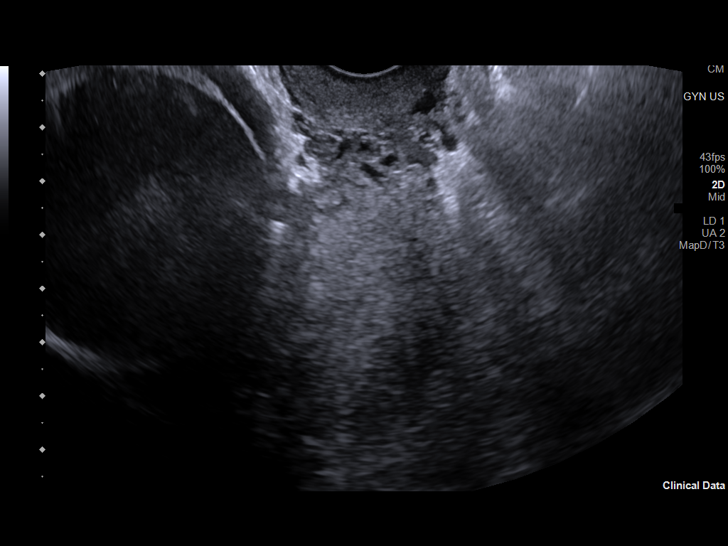
[im 64/96]
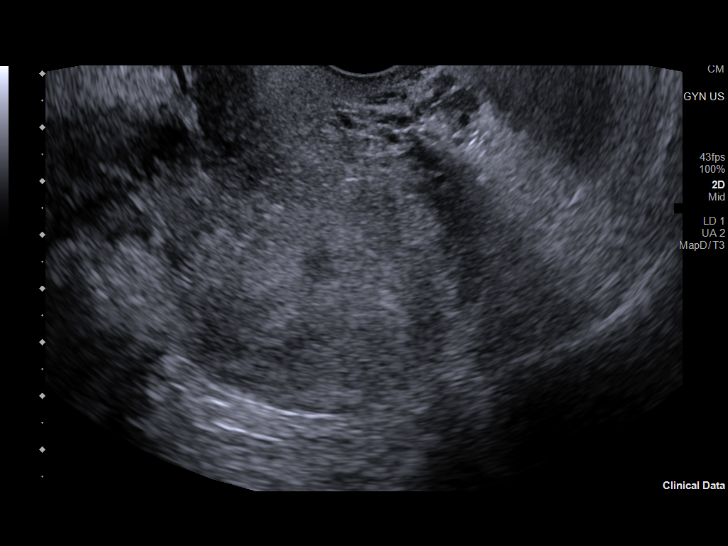
[im 72/96]
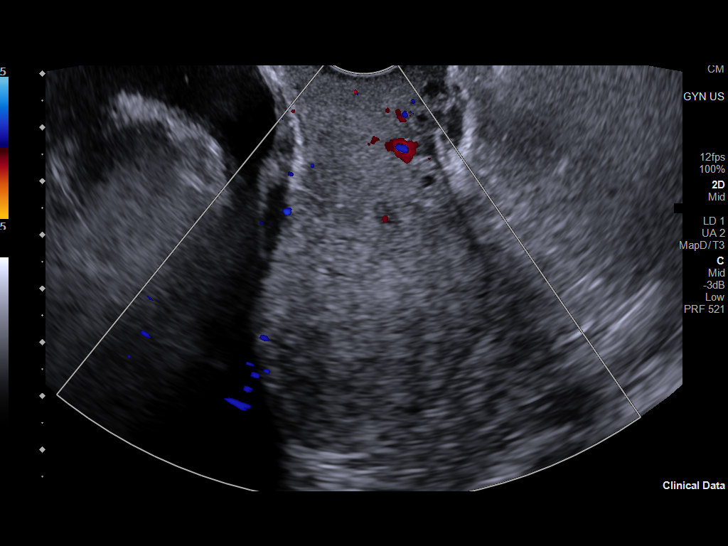
[im 80/96]
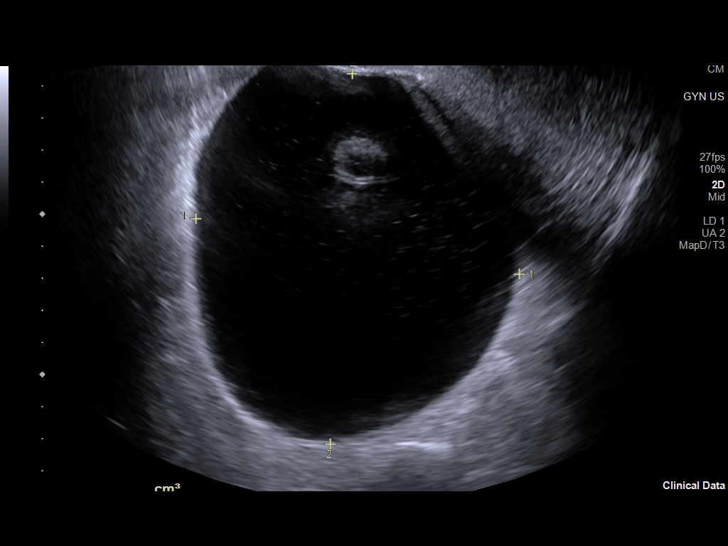
[im 88/96]
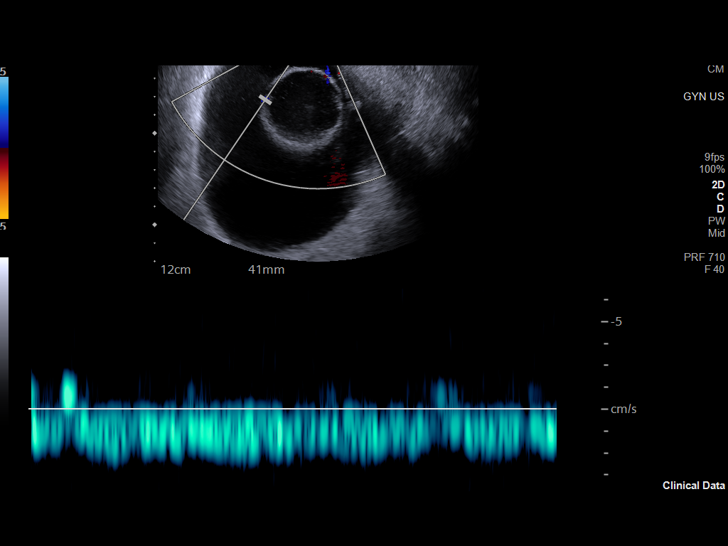
[im 96/96]
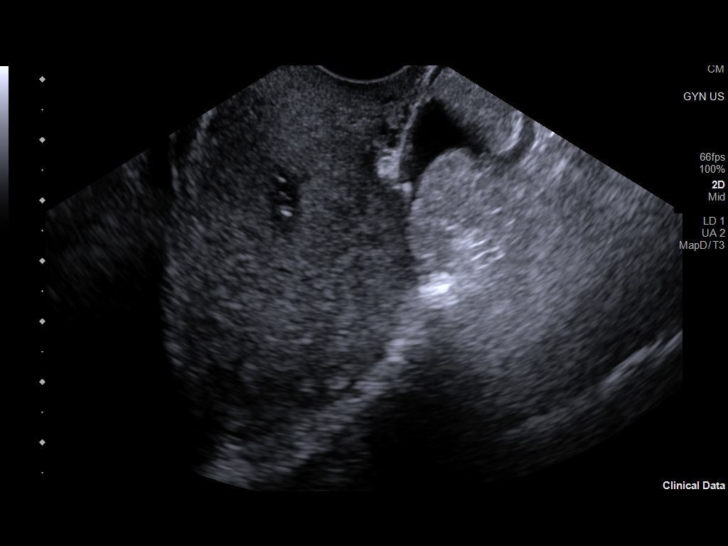

[13 of 25 positions shown; findings below may reference images not displayed]

FINDINGS: Uterus

Measurements: 7.3 x 4.2 x 5.6 cm = volume: 89 mL. No fibroids or
other mass visualized.

Endometrium

Thickness: 7 mm.  No focal abnormality visualized.

Right ovary

Measurements: A large thin walled cystic lesion is seen which
contains an internal cyst, which resembles a mature graafian
follicle. This measures 10.4 x 11.6 x 10.2 cm. No solid component
visualized. No ovarian tissue is seen, however shows some internal
blood flow is seen within the peripheral wall and internal cyst of
this lesion.

Left ovary

Measurements: Not visualized, however no adnexal mass identified.

Pulsed Doppler evaluation of the ovaries could not be directly
performed due to nonvisualization of the left ovary and the right
adnexal cyst described above. Low-resistance arterial and venous
waveforms were obtained from within the thin walls of the right
adnexal lesion.

Other findings

No abnormal free fluid.
IMPRESSION: 11.6 cm probably benign cystic lesion in the right adnexa, which has
features of a mature graafian follicle.

Nonvisualization of left ovary.

Normal appearance of uterus.

## 2023-06-25 ENCOUNTER — Encounter: Payer: Self-pay | Admitting: *Deleted

## 2023-06-25 ENCOUNTER — Encounter: Payer: Medicaid Other | Admitting: Adult Health

## 2023-06-30 DIAGNOSIS — Z419 Encounter for procedure for purposes other than remedying health state, unspecified: Secondary | ICD-10-CM | POA: Diagnosis not present

## 2023-07-10 ENCOUNTER — Encounter: Payer: Medicaid Other | Admitting: Obstetrics & Gynecology

## 2023-07-19 ENCOUNTER — Encounter: Payer: Medicaid Other | Admitting: Obstetrics & Gynecology

## 2023-07-30 DIAGNOSIS — Z419 Encounter for procedure for purposes other than remedying health state, unspecified: Secondary | ICD-10-CM | POA: Diagnosis not present

## 2023-08-19 ENCOUNTER — Encounter: Payer: Medicaid Other | Admitting: Obstetrics & Gynecology

## 2023-08-28 DIAGNOSIS — H5213 Myopia, bilateral: Secondary | ICD-10-CM | POA: Diagnosis not present

## 2023-08-30 DIAGNOSIS — Z419 Encounter for procedure for purposes other than remedying health state, unspecified: Secondary | ICD-10-CM | POA: Diagnosis not present

## 2023-09-29 DIAGNOSIS — Z419 Encounter for procedure for purposes other than remedying health state, unspecified: Secondary | ICD-10-CM | POA: Diagnosis not present

## 2023-10-30 DIAGNOSIS — Z419 Encounter for procedure for purposes other than remedying health state, unspecified: Secondary | ICD-10-CM | POA: Diagnosis not present

## 2023-11-10 DIAGNOSIS — Z419 Encounter for procedure for purposes other than remedying health state, unspecified: Secondary | ICD-10-CM | POA: Diagnosis not present

## 2023-11-30 DIAGNOSIS — Z419 Encounter for procedure for purposes other than remedying health state, unspecified: Secondary | ICD-10-CM | POA: Diagnosis not present

## 2023-12-11 DIAGNOSIS — Z419 Encounter for procedure for purposes other than remedying health state, unspecified: Secondary | ICD-10-CM | POA: Diagnosis not present

## 2023-12-28 DIAGNOSIS — Z419 Encounter for procedure for purposes other than remedying health state, unspecified: Secondary | ICD-10-CM | POA: Diagnosis not present

## 2024-02-08 DIAGNOSIS — Z419 Encounter for procedure for purposes other than remedying health state, unspecified: Secondary | ICD-10-CM | POA: Diagnosis not present

## 2024-03-09 DIAGNOSIS — Z419 Encounter for procedure for purposes other than remedying health state, unspecified: Secondary | ICD-10-CM | POA: Diagnosis not present

## 2024-03-09 DIAGNOSIS — Z3201 Encounter for pregnancy test, result positive: Secondary | ICD-10-CM | POA: Diagnosis not present

## 2024-03-18 ENCOUNTER — Encounter: Admitting: Certified Nurse Midwife

## 2024-03-18 ENCOUNTER — Ambulatory Visit (INDEPENDENT_AMBULATORY_CARE_PROVIDER_SITE_OTHER)

## 2024-03-18 VITALS — BP 128/78 | HR 102 | Wt 372.0 lb

## 2024-03-18 DIAGNOSIS — Z8759 Personal history of other complications of pregnancy, childbirth and the puerperium: Secondary | ICD-10-CM | POA: Insufficient documentation

## 2024-03-18 DIAGNOSIS — Z3481 Encounter for supervision of other normal pregnancy, first trimester: Secondary | ICD-10-CM

## 2024-03-18 DIAGNOSIS — Z3480 Encounter for supervision of other normal pregnancy, unspecified trimester: Secondary | ICD-10-CM | POA: Insufficient documentation

## 2024-03-18 DIAGNOSIS — O09299 Supervision of pregnancy with other poor reproductive or obstetric history, unspecified trimester: Secondary | ICD-10-CM | POA: Insufficient documentation

## 2024-03-18 DIAGNOSIS — O0990 Supervision of high risk pregnancy, unspecified, unspecified trimester: Secondary | ICD-10-CM | POA: Insufficient documentation

## 2024-03-18 DIAGNOSIS — Z3A01 Less than 8 weeks gestation of pregnancy: Secondary | ICD-10-CM | POA: Diagnosis not present

## 2024-03-18 DIAGNOSIS — Z3687 Encounter for antenatal screening for uncertain dates: Secondary | ICD-10-CM

## 2024-03-18 NOTE — Progress Notes (Addendum)
 New OB Intake  I connected with  Bianca Hayes on 03/18/24 at 10:15 AM EDT by in office visit.  I discussed the limitations, risks, security and privacy concerns of performing an evaluation and management service by telephone and the availability of in person appointments. I also discussed with the patient that there may be a patient responsible charge related to this service. The patient expressed understanding and agreed to proceed.  I explained I am completing New OB Intake today. We discussed her EDD of 09/19/24 that is based on LMP of 12/14/23. Pt is G6/P3. I reviewed her allergies, medications, Medical/Surgical/OB history, and appropriate screenings. There are cats in the home: no. Based on history, this is a/an pregnancy uncomplicated . Her obstetrical history is significant for N/A.  Patient Active Problem List   Diagnosis Date Noted   Supervision of other normal pregnancy, antepartum 03/18/2024   History of gestational diabetes mellitus (GDM) in prior pregnancy, currently pregnant 03/18/2024   History of gestational hypertension 03/18/2024   Morbid obesity (HCC) 02/05/2014    Concerns addressed today: None  Delivery Plans:  Plans to deliver at [facility].  Anatomy US  Explained first scheduled US  will be soon. Anatomy US  will be scheduled around [redacted] weeks gestational age.  Labs Discussed genetic screening with patient. Patient desires genetic testing to be drawn at new OB visit. Discussed possible labs to be drawn at new OB appointment.  COVID Vaccine Patient has had 1 COVID vaccine.   Social Determinants of Health Food Insecurity: expresses food insecurity. Information given on local food banks. Transportation: Patient denies transportation needs. Childcare: Discussed no children allowed at ultrasound appointments.   First visit review I reviewed new OB appt with pt. I explained she will have blood work and pap smear/pelvic exam if indicated. Explained  pt will be seen by Alise Appl, CNM at first visit; encounter routed to appropriate provider.   Bianca Hayes, CMA 03/18/2024  10:49 AM

## 2024-03-18 NOTE — Patient Instructions (Signed)
 First Trimester of Pregnancy  The first trimester of pregnancy starts on the first day of your last monthly period until the end of week 13. This is months 1 through 3 of pregnancy. A week after a sperm fertilizes an egg, the egg will implant into the wall of the uterus and begin to develop into a baby. Body changes during your first trimester Your body goes through many changes during pregnancy. The changes usually return to normal after your baby is born. Physical changes Your breasts may grow larger and may hurt. The area around your nipples may get darker. Your periods will stop. Your hair and nails may grow faster. You may pee more often. Health changes You may tire easily. Your gums may bleed and may be sensitive when you brush and floss. You may not feel hungry. You may have heartburn. You may throw up or feel like you may throw up. You may want to eat some foods, but not others. You may have headaches. You may have trouble pooping (constipation). Other changes Your emotions may change from day to day. You may have more dreams. Follow these instructions at home: Medicines Talk to your health care provider if you're taking medicines. Ask if the medicines are safe to take during pregnancy. Your provider may change the medicines that you take. Do not take any medicines unless told to by your provider. Take a prenatal vitamin that has at least 600 micrograms (mcg) of folic acid. Do not use herbal medicines, illegal substances, or medicines that are not approved by your provider. Eating and drinking While you're pregnant your body needs extra food for your growing baby. Talk with your provider about what to eat while pregnant. Activity Most women are able to exercise during pregnancy. Exercises may need to change as your pregnancy goes on. Talk to your provider about your activities and exercise routines. Relieving pain and discomfort Wear a good, supportive bra if your breasts  hurt. Rest with your legs raised if you have leg cramps or low back pain. Safety Wear your seatbelt at all times when you're in a car. Talk to your provider if someone hits you, hurts you, or yells at you. Talk with your provider if you're feeling sad or have thoughts of hurting yourself. Lifestyle Certain things can be harmful while you're pregnant. Follow these rules: Do not use hot tubs, steam rooms, or saunas. Do not douche. Do not use tampons or scented pads. Do not drink alcohol,smoke, vape, or use products with nicotine or tobacco in them. If you need help quitting, talk with your provider. Avoid cat litter boxes and soil used by cats. These things carry germs that can cause harm to your pregnancy and your baby. General instructions Keep all follow-up visits. It helps you and your unborn baby stay as healthy as possible. Write down your questions. Take them to your visits. Your provider will: Talk with you about your overall health. Give you advice or refer you to specialists who can help with different needs, including: Prenatal education classes. Mental health and counseling. Foods and healthy eating. Ask for help if you need help with food. Call your dentist and ask to be seen. Brush your teeth with a soft toothbrush. Floss gently. Where to find more information American Pregnancy Association: americanpregnancy.org [employer]ians [employer]necologists: acog.org Office on [employer] Health: [employer] Contact a health care provider if: You feel dizzy, faint, or have a fever. You vomit or have watery poop (diarrhea) for 2  days or more. You have abnormal discharge or bleeding from your vagina. You have pain when you pee or your pee smells bad. You have cramps, pain, or pressure in your belly area. Get help right away if: You have trouble breathing or chest pain. You have any kind of injury, such as from a fall or a car crash. These symptoms may be an  emergency. Get help right away. Call 911. Do not wait to see if the symptoms will go away. Do not drive yourself to the hospital. This information is not intended to replace advice given to you by your health care provider. Make sure you discuss any questions you have with your health care provider. Document Revised: 07/18/2023 Document Reviewed: 02/15/2023 Elsevier Patient Education  2024 Elsevier Inc.   Common Medications Safe in Pregnancy  Acne:      Constipation:  Benzoyl Peroxide     Colace  Clindamycin      Dulcolax Suppository  Topica Erythromycin     Fibercon  Salicylic Acid      Metamucil         Miralax AVOID:        Senakot   Accutane    Cough:  Retin-A       Cough Drops  Tetracycline      Phenergan w/ Codeine if Rx  Minocycline      Robitussin (Plain & DM)  Antibiotics:     Crabs/Lice:  Ceclor       RID  Cephalosporins    AVOID:  E-Mycins      Kwell  Keflex  Macrobid/Macrodantin   Diarrhea:  Penicillin      Kao-Pectate  Zithromax      Imodium AD         PUSH FLUIDS AVOID:       Cipro     Fever:  Tetracycline      Tylenol (Regular or Extra  Minocycline       Strength)  Levaquin      Extra Strength-Do not          Exceed 8 tabs/24 hrs Caffeine:        200mg /day (equiv. To 1 cup of coffee or  approx. 3 12 oz sodas)         Gas: Cold/Hayfever:       Gas-X  Benadryl      Mylicon  Claritin       Phazyme  **Claritin-D        Chlor-Trimeton    Headaches:  Dimetapp      ASA-Free Excedrin  Drixoral-Non-Drowsy     Cold Compress  Mucinex (Guaifenasin)     Tylenol (Regular or Extra  Sudafed/Sudafed-12 Hour     Strength)  **Sudafed PE Pseudoephedrine   Tylenol Cold & Sinus     Vicks Vapor Rub  Zyrtec  **AVOID if Problems With Blood Pressure         Heartburn: Avoid lying down for at least 1 hour after meals  Aciphex      Maalox     Rash:  Milk of Magnesia     Benadryl    Mylanta       1% Hydrocortisone Cream  Pepcid  Pepcid Complete   Sleep  Aids:  Prevacid      Ambien   Prilosec       Benadryl  Rolaids       Chamomile Tea  Tums (Limit 4/day)     Unisom  Tylenol PM         Warm milk-add vanilla or  Hemorrhoids:       Sugar for taste  Anusol/Anusol H.C.  (RX: Analapram 2.5%)  Sugar Substitutes:  Hydrocortisone OTC     Ok in moderation  Preparation H      Tucks        Vaseline lotion applied to tissue with wiping    Herpes:     Throat:  Acyclovir      Oragel  Famvir  Valtrex     Vaccines:         Flu Shot Leg Cramps:       *Gardasil  Benadryl      Hepatitis A         Hepatitis B Nasal Spray:       Pneumovax  Saline Nasal Spray     Polio Booster         Tetanus Nausea:       Tuberculosis test or PPD  Vitamin B6 25 mg TID   AVOID:    Dramamine      *Gardasil  Emetrol       Live Poliovirus  Ginger Root 250 mg QID    MMR (measles, mumps &  High Complex Carbs @ Bedtime    rebella)  Sea Bands-Accupressure    Varicella (Chickenpox)  Unisom 1/2 tab TID     *No known complications           If received before Pain:         Known pregnancy;   Darvocet       Resume series after  Lortab        Delivery  Percocet    Yeast:   Tramadol      Femstat  Tylenol 3      Gyne-lotrimin  Ultram       Monistat  Vicodin           MISC:         All Sunscreens           Hair Coloring/highlights          Insect Repellant's          (Including DEET)         Mystic Tans   Commonly Asked Questions During Pregnancy   Cats: A parasite can be excreted in cat feces.  To avoid exposure you need to have another person empty the little box.  If you must empty the litter box you will need to wear gloves.  Wash your hands after handling your cat.  This parasite can also be found in raw or undercooked meat so this should also be avoided.  Colds, Sore Throats, Flu: Please check your medication sheet to see what you can take for symptoms.  If your symptoms are unrelieved by these medications please call the office.  Dental Work: Most  any dental work Agricultural consultant recommends is permitted.  X-rays should only be taken during the first trimester if absolutely necessary.  Your abdomen should be shielded with a lead apron during all x-rays.  Please notify your provider prior to receiving any x-rays.  Novocaine is fine; gas is not recommended.  If your dentist requires a note from [place] prior to dental work please call the office and we will provide one for you.  Exercise: Exercise is an important part of staying healthy during your pregnancy.  You may continue most exercises you were accustomed to prior to pregnancy.  Later in your pregnancy you will most likely notice you have difficulty with activities requiring balance like riding a bicycle.  It is important that you listen to your body and avoid activities that put you at a higher risk of falling.  Adequate rest and staying well hydrated are a must!  If you have questions about the safety of specific activities ask your provider.    Exposure to Children with illness: Try to avoid obvious exposure; report any symptoms to [place] when noted,  If you have chicken pos, red measles or mumps, you should be immune to these diseases.   Please do not take any vaccines while pregnant unless you have checked with your OB provider.  Fetal Movement: After 28 weeks we recommend you do "kick counts" twice daily.  Lie or sit down in a calm quiet environment and count your baby movements "kicks".  You should feel your baby at least 10 times per hour.  If you have not felt 10 kicks within the first hour get up, walk around and have something sweet to eat or drink then repeat for an additional hour.  If count remains less than 10 per hour notify your provider.  Fumigating: Follow your pest control agent's advice as to how long to stay out of your home.  Ventilate the area well before re-entering.  Hemorrhoids:   Most over-the-counter preparations can be used during pregnancy.  Check your medication to see what is  safe to use.  It is important to use a stool softener or fiber in your diet and to drink lots of liquids.  If hemorrhoids seem to be getting worse please call the office.   Hot Tubs:  Hot tubs Jacuzzis and saunas are not recommended while pregnant.  These increase your internal body temperature and should be avoided.  Intercourse:  Sexual intercourse is safe during pregnancy as long as you are comfortable, unless otherwise advised by your provider.  Spotting may occur after intercourse; report any bright red bleeding that is heavier than spotting.  Labor:  If you know that you are in labor, please go to the hospital.  If you are unsure, please call the office and let us help you decide what to do.  Lifting, straining, etc:  If your job requires heavy lifting or straining please check with your provider for any limitations.  Generally, you should not lift items heavier than that you can lift simply with your hands and arms (no back muscles)  Painting:  Paint fumes do not harm your pregnancy, but may make you ill and should be avoided if possible.  Latex or water based paints have less odor than oils.  Use adequate ventilation while painting.  Permanents & Hair Color:  Chemicals in hair dyes are not recommended as they cause increase hair dryness which can increase hair loss during pregnancy.  " Highlighting" and permanents are allowed.  Dye may be absorbed differently and permanents may not hold as well during pregnancy.  Sunbathing:  Use a sunscreen, as skin burns easily during pregnancy.  Drink plenty of fluids; avoid over heating.  Tanning Beds:  Because their possible side effects are still unknown, tanning beds are not recommended.  Ultrasound Scans:  Routine ultrasounds are performed at approximately 20 weeks.  You will be able to see your baby's general anatomy an if you would like to know the gender this can usually be determined as well.  If it is questionable when you conceived you may  also  receive an ultrasound early in your pregnancy for dating purposes.  Otherwise ultrasound exams are not routinely performed unless there is a medical necessity.  Although you can request a scan we ask that you pay for it when conducted because insurance does not cover " patient request" scans.  Work: If your pregnancy proceeds without complications you may work until your due date, unless your physician or employer advises otherwise.  Round Ligament Pain/Pelvic Discomfort:  Sharp, shooting pains not associated with bleeding are fairly common, usually occurring in the second trimester of pregnancy.  They tend to be worse when standing up or when you remain standing for long periods of time.  These are the result of pressure of certain pelvic ligaments called "round ligaments".  Rest, Tylenol and heat seem to be the most effective relief.  As the womb and fetus grow, they rise out of the pelvis and the discomfort improves.  Please notify the office if your pain seems different than that described.  It may represent a more serious condition.

## 2024-03-24 ENCOUNTER — Other Ambulatory Visit: Payer: Self-pay | Admitting: Obstetrics

## 2024-03-24 ENCOUNTER — Ambulatory Visit
Admission: RE | Admit: 2024-03-24 | Discharge: 2024-03-24 | Disposition: A | Discharge status: Home / Self Care | Source: Ambulatory Visit | Attending: Obstetrics | Admitting: Obstetrics

## 2024-03-24 DIAGNOSIS — Z8759 Personal history of other complications of pregnancy, childbirth and the puerperium: Secondary | ICD-10-CM

## 2024-03-24 DIAGNOSIS — Z3687 Encounter for antenatal screening for uncertain dates: Secondary | ICD-10-CM

## 2024-03-24 DIAGNOSIS — O09299 Supervision of pregnancy with other poor reproductive or obstetric history, unspecified trimester: Secondary | ICD-10-CM

## 2024-03-24 DIAGNOSIS — Z3480 Encounter for supervision of other normal pregnancy, unspecified trimester: Secondary | ICD-10-CM

## 2024-03-24 DIAGNOSIS — N83201 Unspecified ovarian cyst, right side: Secondary | ICD-10-CM | POA: Diagnosis not present

## 2024-03-24 DIAGNOSIS — O3482 Maternal care for other abnormalities of pelvic organs, second trimester: Secondary | ICD-10-CM | POA: Diagnosis not present

## 2024-03-24 DIAGNOSIS — Z3A16 16 weeks gestation of pregnancy: Secondary | ICD-10-CM | POA: Diagnosis not present

## 2024-03-25 ENCOUNTER — Ambulatory Visit (INDEPENDENT_AMBULATORY_CARE_PROVIDER_SITE_OTHER): Admitting: Certified Nurse Midwife

## 2024-03-25 ENCOUNTER — Other Ambulatory Visit (HOSPITAL_COMMUNITY)
Admission: RE | Admit: 2024-03-25 | Discharge: 2024-03-25 | Disposition: A | Discharge status: Home / Self Care | Source: Ambulatory Visit | Attending: Certified Nurse Midwife | Admitting: Certified Nurse Midwife

## 2024-03-25 ENCOUNTER — Other Ambulatory Visit: Payer: Self-pay

## 2024-03-25 ENCOUNTER — Encounter: Payer: Self-pay | Admitting: Certified Nurse Midwife

## 2024-03-25 VITALS — BP 138/80 | HR 103 | Wt 375.0 lb

## 2024-03-25 DIAGNOSIS — O09299 Supervision of pregnancy with other poor reproductive or obstetric history, unspecified trimester: Secondary | ICD-10-CM

## 2024-03-25 DIAGNOSIS — Z8759 Personal history of other complications of pregnancy, childbirth and the puerperium: Secondary | ICD-10-CM

## 2024-03-25 DIAGNOSIS — Z3492 Encounter for supervision of normal pregnancy, unspecified, second trimester: Secondary | ICD-10-CM | POA: Diagnosis not present

## 2024-03-25 DIAGNOSIS — Z131 Encounter for screening for diabetes mellitus: Secondary | ICD-10-CM

## 2024-03-25 DIAGNOSIS — Z124 Encounter for screening for malignant neoplasm of cervix: Secondary | ICD-10-CM | POA: Insufficient documentation

## 2024-03-25 DIAGNOSIS — Z113 Encounter for screening for infections with a predominantly sexual mode of transmission: Secondary | ICD-10-CM | POA: Insufficient documentation

## 2024-03-25 DIAGNOSIS — O0990 Supervision of high risk pregnancy, unspecified, unspecified trimester: Secondary | ICD-10-CM

## 2024-03-25 DIAGNOSIS — Z3A14 14 weeks gestation of pregnancy: Secondary | ICD-10-CM

## 2024-03-25 DIAGNOSIS — O0992 Supervision of high risk pregnancy, unspecified, second trimester: Secondary | ICD-10-CM | POA: Diagnosis not present

## 2024-03-25 DIAGNOSIS — Z130 Encounter for screening for diseases of the blood and blood-forming organs and certain disorders involving the immune mechanism: Secondary | ICD-10-CM

## 2024-03-25 DIAGNOSIS — Z1379 Encounter for other screening for genetic and chromosomal anomalies: Secondary | ICD-10-CM

## 2024-03-25 DIAGNOSIS — Z0283 Encounter for blood-alcohol and blood-drug test: Secondary | ICD-10-CM

## 2024-03-25 DIAGNOSIS — Z0184 Encounter for antibody response examination: Secondary | ICD-10-CM

## 2024-03-25 MED ORDER — ASPIRIN 81 MG PO TBEC: 162.0000 mg | DELAYED_RELEASE_TABLET | Freq: Every day | ORAL | 12 refills | Status: AC

## 2024-03-25 NOTE — Progress Notes (Signed)
 NEW OB HISTORY AND PHYSICAL  SUBJECTIVE:       Bianca Hayes is a 33 y.o. [phone] female, Patient's last menstrual period was 12/14/2023., Estimated Date of Delivery: 09/19/24, [redacted]w[redacted]d, presents today for establishment of Prenatal Care. She has no unusual complaints  Relationship: single/dating FOB Living: with her children Work: fast food  Exercise "tries"  Alcohol/drugs/smoke/vape: denies use    Gynecologic History Patient's last menstrual period was 12/14/2023. Normal Contraception: none Last Pap: 06/05/2023. Results were: LSIL/+HPV 16  Obstetric History OB History  Gravida Para Term Preterm AB Living  6 3 3  2 3   SAB IAB Ectopic Multiple Live Births  2    3    # Outcome Date GA Lbr Len/2nd Weight Sex Type Anes PTL Lv  6 Current           5 SAB 2022          4 SAB 2020          3 Term 07/17/14    F Vag-Spont   LIV  2 Term 11/18/10    F Vag-Spont   LIV  1 Term 12/05/09    F Vag-Spont   LIV    Past Medical History:  Diagnosis Date   Anemia    Asthma    GERD (gastroesophageal reflux disease)    H/O cervical fracture    Ovarian cyst    Skull fracture (HCC)     Past Surgical History:  Procedure Laterality Date   BIOPSY THYROID      DENTAL SURGERY      Current Outpatient Medications on File Prior to Visit  Medication Sig Dispense Refill   Prenatal Vit-Fe Fumarate-FA (PRENATAL PO) Take by mouth daily.     No current facility-administered medications on file prior to visit.    Allergies  Allergen Reactions   Coconut Flavoring Agent (Non-Screening) Anaphylaxis   Iodine Hives and Shortness Of Breath   Bactrim [Sulfamethoxazole-Trimethoprim] Rash   Penicillins Rash    Social History   Socioeconomic History   Marital status: Single    Spouse name: Not on file   Number of children: 3   Years of education: Not on file   Highest education level: Not on file  Occupational History    Employer: [employer]  Tobacco Use   Smoking status: Never   Smokeless  tobacco: Never  Vaping Use   Vaping status: Never Used  Substance and Sexual Activity   Alcohol use: No   Drug use: No   Sexual activity: Yes    Partners: Male    Birth control/protection: None  Other Topics Concern   Not on file  Social History Narrative   Not on file   Social Drivers of Health   Financial Resource Strain: Medium Risk (03/18/2024)   Overall Financial Resource Strain (CARDIA)    Difficulty of Paying Living Expenses: Somewhat hard  Food Insecurity: Food Insecurity Present (03/18/2024)   Hunger Vital Sign    Worried About Running Out of Food in the Last Year: Sometimes true    Ran Out of Food in the Last Year: Sometimes true  Transportation Needs: No Transportation Needs (03/18/2024)   PRAPARE - Administrator, Civil Service (Medical): No    Lack of Transportation (Non-Medical): No  Physical Activity: Insufficiently Active (03/18/2024)   Exercise Vital Sign    Days of Exercise per Week: 2 days    Minutes of Exercise per Session: 30 min  Stress: No Stress Concern Present (03/18/2024)  [employer] of Occupational Health - Occupational Stress Questionnaire    Feeling of Stress : Only a little  Social Connections: Moderately Isolated (03/18/2024)   Social Connection and Isolation Panel [NHANES]    Frequency of Communication with Friends and Family: More than three times a week    Frequency of Social Gatherings with Friends and Family: More than three times a week    Attends Religious Services: More than 4 times per year    Active Member of [employer] or Organizations: No    Attends Banker Meetings: Never    Marital Status: Divorced  Catering manager Violence: Not At Risk (03/18/2024)   Humiliation, Afraid, Rape, and Kick questionnaire    Fear of Current or Ex-Partner: No    Emotionally Abused: No    Physically Abused: No    Sexually Abused: No    Family History  Problem Relation Age of Onset   Thyroid  disease Mother    Hypertension  Mother    Congestive Heart Failure Mother    COPD Mother    Hyperlipidemia Mother    CAD Mother    Stroke Mother    Heart failure Mother    Heart failure Father    CAD Father    Hyperlipidemia Father    Hypertension Father    Immunodeficiency Father    Hypertension Maternal Grandmother    Diabetes Maternal Grandmother    Hypertension Maternal Grandfather    Diabetes Maternal Grandfather    Hypertension Paternal Grandmother    Diabetes Paternal Grandmother    Hypertension Paternal Grandfather    Diabetes Paternal Grandfather     The following portions of the patient's history were reviewed and updated as appropriate: allergies, current medications, past OB history, past medical history, past surgical history, past family history, past social history, and problem list.    OBJECTIVE: Initial Physical Exam (New OB)  GENERAL APPEARANCE: alert, well appearing, in no apparent distress, oriented to person, place and time, mrobidly obese HEAD: normocephalic, atraumatic MOUTH: mucous membranes moist, pharynx normal without lesions THYROID : enlarged and pt has had u/s , was told they want to remove mass. She has declined during pregnancy BREASTS: no masses noted, no significant tenderness, no palpable axillary nodes, no skin changes LUNGS: clear to auscultation, no wheezes, rales or rhonchi, symmetric air entry HEART: regular rate and rhythm, no murmurs ABDOMEN: obese, fundus not palpable, and FHT difficult to ascultate due to Elevated BMI. Beside u/s confirms fetal Movement HR154 EXTREMITIES: no redness or tenderness in the calves or thighs, no edema, no limitation in range of motion, intact peripheral pulses SKIN: normal coloration and turgor, no rashes LYMPH NODES: no adenopathy palpable NEUROLOGIC: alert, oriented, normal speech, no focal findings or movement disorder noted  PELVIC EXAM EXTERNAL GENITALIA: normal appearing vulva with no masses, tenderness or lesions VAGINA: no  abnormal discharge or lesions CERVIX: no lesions or cervical motion tenderness UTERUS: gravid ADNEXA: no masses palpable and nontender OB EXAM PELVIMETRY: appears adequate RECTUM: exam not indicated  ASSESSMENT: High Risk Pregnancy  PLAN: Prenatal care See ordersNew OB counseling: The patient has been given an overview regarding routine prenatal care. Recommendations regarding diet, weight gain ( 10 lbs), and exercise in pregnancy were given. Prenatal testing, optional genetic testing, carrier screening, and ultrasound use in pregnancy were reviewed. Benefits of Breast Feeding were discussed. The patient is encouraged to consider nursing her baby post partum. Discussed anesthesia consult, early glucose, Asprin use. Early Pre e laps completed today. Pt request Maternit 21,  drawn today. Follow up 4 weeks.   Alise Appl, CNM

## 2024-03-25 NOTE — Addendum Note (Signed)
 Addended by: Jenise Iannelli on: 03/25/2024 11:39 AM   Modules accepted: Orders

## 2024-03-26 ENCOUNTER — Other Ambulatory Visit: Payer: Self-pay | Admitting: Certified Nurse Midwife

## 2024-03-26 ENCOUNTER — Emergency Department

## 2024-03-26 ENCOUNTER — Emergency Department
Admission: EM | Admit: 2024-03-26 | Discharge: 2024-03-26 | Disposition: A | Discharge status: Home / Self Care | Attending: Emergency Medicine | Admitting: Emergency Medicine

## 2024-03-26 ENCOUNTER — Other Ambulatory Visit: Payer: Self-pay

## 2024-03-26 ENCOUNTER — Encounter: Payer: Self-pay | Admitting: Certified Nurse Midwife

## 2024-03-26 DIAGNOSIS — R519 Headache, unspecified: Secondary | ICD-10-CM | POA: Insufficient documentation

## 2024-03-26 DIAGNOSIS — Z3A16 16 weeks gestation of pregnancy: Secondary | ICD-10-CM | POA: Diagnosis not present

## 2024-03-26 DIAGNOSIS — J45909 Unspecified asthma, uncomplicated: Secondary | ICD-10-CM | POA: Insufficient documentation

## 2024-03-26 DIAGNOSIS — O99512 Diseases of the respiratory system complicating pregnancy, second trimester: Secondary | ICD-10-CM | POA: Insufficient documentation

## 2024-03-26 DIAGNOSIS — O132 Gestational [pregnancy-induced] hypertension without significant proteinuria, second trimester: Secondary | ICD-10-CM | POA: Insufficient documentation

## 2024-03-26 DIAGNOSIS — O99352 Diseases of the nervous system complicating pregnancy, second trimester: Secondary | ICD-10-CM | POA: Diagnosis not present

## 2024-03-26 DIAGNOSIS — O26892 Other specified pregnancy related conditions, second trimester: Secondary | ICD-10-CM | POA: Diagnosis not present

## 2024-03-26 LAB — URINALYSIS, ROUTINE W REFLEX MICROSCOPIC
Bilirubin Urine: NEGATIVE
Bilirubin, UA: NEGATIVE
Glucose, UA: NEGATIVE
Glucose, UA: NEGATIVE mg/dL
Ketones, UA: NEGATIVE
Ketones, ur: NEGATIVE mg/dL
Leukocytes,UA: NEGATIVE
Nitrite, UA: NEGATIVE
Nitrite: NEGATIVE
Protein, ur: NEGATIVE mg/dL
Protein,UA: NEGATIVE
RBC, UA: NEGATIVE
Specific Gravity, UA: 1.015 (ref 1.005–1.030)
Specific Gravity, Urine: 1.023 (ref 1.005–1.030)
Urobilinogen, Ur: 1 mg/dL (ref 0.2–1.0)
pH, UA: 7 (ref 5.0–7.5)
pH: 5 (ref 5.0–8.0)

## 2024-03-26 LAB — CERVICOVAGINAL ANCILLARY ONLY
Bacterial Vaginitis (gardnerella): NEGATIVE
Candida Glabrata: NEGATIVE
Candida Vaginitis: NEGATIVE
Chlamydia: NEGATIVE
Comment: NEGATIVE
Comment: NEGATIVE
Comment: NEGATIVE
Comment: NEGATIVE
Comment: NEGATIVE
Comment: NORMAL
Neisseria Gonorrhea: NEGATIVE
Trichomonas: POSITIVE — AB

## 2024-03-26 LAB — CBC WITH DIFFERENTIAL/PLATELET
Abs Immature Granulocytes: 0.02 10*3/uL (ref 0.00–0.07)
Basophils Absolute: 0 10*3/uL (ref 0.0–0.1)
Basophils Relative: 0 %
Eosinophils Absolute: 0 10*3/uL (ref 0.0–0.5)
Eosinophils Relative: 1 %
HCT: 33.1 % — ABNORMAL LOW (ref 36.0–46.0)
Hemoglobin: 10.9 g/dL — ABNORMAL LOW (ref 12.0–15.0)
Immature Granulocytes: 0 %
Lymphocytes Relative: 17 %
Lymphs Abs: 1 10*3/uL (ref 0.7–4.0)
MCH: 28.8 pg (ref 26.0–34.0)
MCHC: 32.9 g/dL (ref 30.0–36.0)
MCV: 87.6 fL (ref 80.0–100.0)
Monocytes Absolute: 0.3 10*3/uL (ref 0.1–1.0)
Monocytes Relative: 4 %
Neutro Abs: 4.4 10*3/uL (ref 1.7–7.7)
Neutrophils Relative %: 78 %
Platelets: 149 10*3/uL — ABNORMAL LOW (ref 150–400)
RBC: 3.78 MIL/uL — ABNORMAL LOW (ref 3.87–5.11)
RDW: 15.8 % — ABNORMAL HIGH (ref 11.5–15.5)
WBC: 5.7 10*3/uL (ref 4.0–10.5)
nRBC: 0 % (ref 0.0–0.2)

## 2024-03-26 LAB — COMPREHENSIVE METABOLIC PANEL WITH GFR
ALT: 15 U/L (ref 0–44)
AST: 18 U/L (ref 15–41)
Albumin: 3 g/dL — ABNORMAL LOW (ref 3.5–5.0)
Alkaline Phosphatase: 67 U/L (ref 38–126)
Anion gap: 8 (ref 5–15)
BUN: 9 mg/dL (ref 6–20)
CO2: 23 mmol/L (ref 22–32)
Calcium: 8.2 mg/dL — ABNORMAL LOW (ref 8.9–10.3)
Chloride: 105 mmol/L (ref 98–111)
Creatinine, Ser: 0.63 mg/dL (ref 0.44–1.00)
GFR, Estimated: >60 mL/min (ref >60)
Glucose, Bld: 113 mg/dL — ABNORMAL HIGH (ref 70–99)
Potassium: 3.8 mmol/L (ref 3.5–5.1)
Sodium: 136 mmol/L (ref 135–145)
Total Bilirubin: 0.7 mg/dL (ref 0.0–1.2)
Total Protein: 6 g/dL — ABNORMAL LOW (ref 6.5–8.1)

## 2024-03-26 LAB — PROTEIN / CREATININE RATIO, URINE
Creatinine, Urine: 62.2 mg/dL
Protein, Ur: 8.7 mg/dL
Protein/Creat Ratio: 140 mg/g{creat} (ref 0–200)

## 2024-03-26 MED ORDER — METRONIDAZOLE 500 MG PO TABS: 500.0000 mg | ORAL_TABLET | Freq: Two times a day (BID) | ORAL | 0 refills | Status: AC

## 2024-03-26 MED ORDER — ACETAMINOPHEN 500 MG PO TABS
1000.0000 mg | ORAL_TABLET | Freq: Once | ORAL | Status: AC
  Administered 2024-03-26: 1000 mg via ORAL
  Filled 2024-03-26: qty 2

## 2024-03-26 NOTE — ED Provider Notes (Signed)
 [facility] Provider Note    Event Date/Time   First MD Initiated Contact with Patient 03/26/24 1548     (approximate)   History   Chief Complaint Hypertension   HPI  Bianca Hayes is a 33 y.o. female [id number] at approximately 16 weeks of pregnancy and with past medical history of gestational hypertension, GERD, asthma, and anemia who presents to the ED complaining of headache.  Patient reports that she has been dealing with increasing headache over the past 3 days, particularly over the left side of her head.  She does report issues with headaches in the past, but states today's headache has been more severe.  She has been feeling lightheaded and like she is going to pass out at times, denies any fevers or neck stiffness.  She has not had any vision changes, speech changes, numbness, or weakness.  She does report being seen by her OB/GYN yesterday, told that her blood pressure was elevated and that she needed to start on a daily baby aspirin .  She does not currently take any blood pressure medications.     Physical Exam   Triage Vital Signs: ED Triage Vitals  Encounter Vitals Group     BP 03/26/24 1332 (!) 135/104     Systolic BP Percentile --      Diastolic BP Percentile --      Pulse Rate 03/26/24 1332 (!) 107     Resp 03/26/24 1332 18     Temp 03/26/24 1332 97.9 F (36.6 C)     Temp Source 03/26/24 1332 Oral     SpO2 03/26/24 1332 100 %     Weight 03/26/24 1333 (!) 375 lb (170.1 kg)     Height 03/26/24 1333 5\' 9"  (1.753 m)     Head Circumference --      Peak Flow --      Pain Score 03/26/24 1333 10     Pain Loc --      Pain Education --      Exclude from Growth Chart --     Most recent vital signs: Vitals:   03/26/24 1332 03/26/24 1755  BP: (!) 135/104 128/80  Pulse: (!) 107 90  Resp: 18 18  Temp: 97.9 F (36.6 C) 98 F (36.7 C)  SpO2: 100% 100%    Constitutional: Alert and oriented. Eyes: Conjunctivae are normal.  Pupils  equal, round, and reactive to light bilaterally. Head: Atraumatic. Nose: No congestion/rhinnorhea. Mouth/Throat: Mucous membranes are moist.  Neck: Supple with no meningismus. Cardiovascular: Normal rate, regular rhythm. Grossly normal heart sounds.  2+ radial pulses bilaterally. Respiratory: Normal respiratory effort.  No retractions. Lungs CTAB. Gastrointestinal: Gravid abdomen, soft and nontender. No distention. Musculoskeletal: No lower extremity tenderness nor edema.  Neurologic:  Normal speech and language. No gross focal neurologic deficits are appreciated.    ED Results / Procedures / Treatments   Labs (all labs ordered are listed, but only abnormal results are displayed) Labs Reviewed  COMPREHENSIVE METABOLIC PANEL WITH GFR - Abnormal; Notable for the following components:      Result Value   Glucose, Bld 113 (*)    Calcium 8.2 (*)    Total Protein 6.0 (*)    Albumin 3.0 (*)    All other components within normal limits  CBC WITH DIFFERENTIAL/PLATELET - Abnormal; Notable for the following components:   RBC 3.78 (*)    Hemoglobin 10.9 (*)    HCT 33.1 (*)    RDW 15.8 (*)  Platelets 149 (*)    All other components within normal limits  URINALYSIS, ROUTINE W REFLEX MICROSCOPIC - Abnormal; Notable for the following components:   Color, Urine YELLOW (*)    APPearance HAZY (*)    Hgb urine dipstick SMALL (*)    Leukocytes,Ua TRACE (*)    Bacteria, UA RARE (*)    All other components within normal limits    RADIOLOGY CT head reviewed and interpreted by me with no hemorrhage or midline shift.  PROCEDURES:  Critical Care performed: No  Procedures   MEDICATIONS ORDERED IN ED: Medications  acetaminophen  (TYLENOL ) tablet 1,000 mg (1,000 mg Oral Given 03/26/24 1617)     IMPRESSION / MDM / ASSESSMENT AND PLAN / ED COURSE  I reviewed the triage vital signs and the nursing notes.                              33 y.o. female [phone] at approximately 16 weeks of  pregnancy with gestational hypertension, GERD, asthma, and anemia who presents to the ED complaining of increasing headache over the past 3 days associated with lightheadedness and near syncope.  Patient's presentation is most consistent with acute presentation with potential threat to life or bodily function.  Differential diagnosis includes, but is not limited to, SAH, meningitis, tension headache, migraine headache, venous sinus thrombosis, anemia, electrolyte abnormality, AKI, gestational hypertension, preeclampsia.  Patient nontoxic-appearing and in no acute distress, vital signs remarkable for mildly elevated blood pressure but are otherwise reassuring.  She has a nonfocal neurologic exam, however describes increasing left-sided headache with near syncope.  She was offered MRI and MRV to assess for venous sinus thrombosis or other acute pathology, but declines given claustrophobia and need to leave in 1 hour to pick up her child.  She expresses understanding that this will limit our ability to assess for venous sinus thrombosis, and she prefers to proceed with CT head.  No findings concerning for meningitis and very low suspicion for [facility].  We will treat with Tylenol  and reassess.  Labs without significant anemia, leukocytosis, lactate abnormality, or AKI.  Repeat BP within normal limits and given she is less than [redacted] weeks pregnant with no protein in her urine, does not meet criteria for preeclampsia.  Patient now stating that she needs to leave to pick up her daughter, is unwilling to stay for CT results.  She does state her headache is improved, but explained we cannot exclude intracranial pathology at this time.  She expresses understanding, assures me she will return to the hospital for new or worsening symptoms.      FINAL CLINICAL IMPRESSION(S) / ED DIAGNOSES   Final diagnoses:  Acute nonintractable headache, unspecified headache type     Rx / DC Orders   ED Discharge Orders      None        Note:  This document was prepared using Dragon voice recognition software and may include unintentional dictation errors.   Twilla Galea, MD 03/26/24 [phone]

## 2024-03-26 NOTE — ED Triage Notes (Signed)
 Patient states she is approximately [redacted] weeks pregnant and complaining of elevated BP, dizziness and high blood pressure x 2 days.

## 2024-03-26 NOTE — ED Notes (Signed)
 See triage note  Presents with some slight abd cramping  States she did became dizzy and had near syncopal episode

## 2024-03-27 LAB — COMPREHENSIVE METABOLIC PANEL WITH GFR
ALT: 15 IU/L (ref 0–32)
AST: 13 IU/L (ref 0–40)
Albumin: 3.5 g/dL — ABNORMAL LOW (ref 3.9–4.9)
Alkaline Phosphatase: 95 IU/L (ref 44–121)
BUN/Creatinine Ratio: 16 (ref 9–23)
BUN: 9 mg/dL (ref 6–20)
Bilirubin Total: 0.5 mg/dL (ref 0.0–1.2)
CO2: 20 mmol/L (ref 20–29)
Calcium: 8.5 mg/dL — ABNORMAL LOW (ref 8.7–10.2)
Chloride: 101 mmol/L (ref 96–106)
Creatinine, Ser: 0.56 mg/dL — ABNORMAL LOW (ref 0.57–1.00)
Globulin, Total: 2.2 g/dL (ref 1.5–4.5)
Glucose: 96 mg/dL (ref 70–99)
Potassium: 3.9 mmol/L (ref 3.5–5.2)
Sodium: 137 mmol/L (ref 134–144)
Total Protein: 5.7 g/dL — ABNORMAL LOW (ref 6.0–8.5)
eGFR: 124 mL/min/{1.73_m2} (ref >59)

## 2024-03-27 LAB — HCV INTERPRETATION

## 2024-03-27 LAB — CBC/D/PLT+RPR+RH+ABO+RUBIGG...
Antibody Screen: NEGATIVE
Basophils Absolute: 0 10*3/uL (ref 0.0–0.2)
Basos: 0 %
EOS (ABSOLUTE): 0.1 10*3/uL (ref 0.0–0.4)
Eos: 1 %
HCV Ab: NONREACTIVE
HIV Screen 4th Generation wRfx: NONREACTIVE
Hematocrit: 33.8 % — ABNORMAL LOW (ref 34.0–46.6)
Hemoglobin: 11.3 g/dL (ref 11.1–15.9)
Hepatitis B Surface Ag: NEGATIVE
Immature Grans (Abs): 0 10*3/uL (ref 0.0–0.1)
Immature Granulocytes: 0 %
Lymphocytes Absolute: 0.9 10*3/uL (ref 0.7–3.1)
Lymphs: 17 %
MCH: 29.3 pg (ref 26.6–33.0)
MCHC: 33.4 g/dL (ref 31.5–35.7)
MCV: 88 fL (ref 79–97)
Monocytes Absolute: 0.3 10*3/uL (ref 0.1–0.9)
Monocytes: 5 %
Neutrophils Absolute: 3.9 10*3/uL (ref 1.4–7.0)
Neutrophils: 77 %
Platelets: 172 10*3/uL (ref 150–450)
RBC: 3.86 x10E6/uL (ref 3.77–5.28)
RDW: 15 % (ref 11.7–15.4)
RPR Ser Ql: NONREACTIVE
Rh Factor: POSITIVE
Rubella Antibodies, IGG: 1.52 {index} (ref >0.99)
Varicella zoster IgG: REACTIVE
WBC: 5.1 10*3/uL (ref 3.4–10.8)

## 2024-03-27 LAB — MONITOR DRUG PROFILE 14(MW)
Amphetamine Scrn, Ur: NEGATIVE ng/mL
BARBITURATE SCREEN URINE: NEGATIVE ng/mL
BENZODIAZEPINE SCREEN, URINE: NEGATIVE ng/mL
Buprenorphine, Urine: NEGATIVE ng/mL
CANNABINOIDS UR QL SCN: NEGATIVE ng/mL
Cocaine (Metab) Scrn, Ur: NEGATIVE ng/mL
Creatinine(Crt), U: 68.1 mg/dL (ref 20.0–300.0)
Fentanyl, Urine: NEGATIVE pg/mL
Meperidine Screen, Urine: NEGATIVE ng/mL
Methadone Screen, Urine: NEGATIVE ng/mL
OXYCODONE+OXYMORPHONE UR QL SCN: NEGATIVE ng/mL
Opiate Scrn, Ur: NEGATIVE ng/mL
Ph of Urine: 6.7 (ref 4.5–8.9)
Phencyclidine Qn, Ur: NEGATIVE ng/mL
Propoxyphene Scrn, Ur: NEGATIVE ng/mL
SPECIFIC GRAVITY: 1.012
Tramadol Screen, Urine: NEGATIVE ng/mL

## 2024-03-27 LAB — NICOTINE SCREEN, URINE: Cotinine Ql Scrn, Ur: NEGATIVE ng/mL

## 2024-03-27 LAB — HEMOGLOBIN A1C
Est. average glucose Bld gHb Est-mCnc: 85 mg/dL
Hgb A1c MFr Bld: 4.6 % — ABNORMAL LOW (ref 4.8–5.6)

## 2024-03-27 LAB — TSH+FREE T4
Free T4: 0.84 ng/dL (ref 0.82–1.77)
TSH: 0.018 u[IU]/mL — ABNORMAL LOW (ref 0.450–4.500)

## 2024-03-27 LAB — CULTURE, OB URINE

## 2024-03-27 LAB — URINE CULTURE, OB REFLEX

## 2024-03-30 ENCOUNTER — Telehealth: Payer: Self-pay

## 2024-03-30 LAB — MATERNIT 21 PLUS CORE, BLOOD
Fetal Fraction: 10
Result (T21): NEGATIVE
Trisomy 13 (Patau syndrome): NEGATIVE
Trisomy 18 (Edwards syndrome): NEGATIVE
Trisomy 21 (Down syndrome): NEGATIVE

## 2024-03-30 NOTE — Telephone Encounter (Signed)
 Patient states she has seen her Mat 21 results and just wanted to verify that everything was good as she saw all negative and that the baby is female. Advised correct, there were no increased risk for Downs/Trisomy and baby is female. Patient also states she was advised to schedule anesthesia consult. She has scheduled for 04/01/24 at noon. Patient inquired about this appointment. Advised of  need to assess airway for patency and verify she is a candidate to deliver at [facility].

## 2024-04-01 ENCOUNTER — Institutional Professional Consult (permissible substitution)

## 2024-04-03 ENCOUNTER — Encounter
Admission: RE | Admit: 2024-04-03 | Discharge: 2024-04-03 | Disposition: A | Discharge status: Home / Self Care | Source: Ambulatory Visit | Attending: Anesthesiology | Admitting: Anesthesiology

## 2024-04-03 ENCOUNTER — Ambulatory Visit: Payer: Self-pay | Admitting: Obstetrics

## 2024-04-03 LAB — CYTOLOGY - PAP
Comment: NEGATIVE
Comment: NEGATIVE
HPV 16: NEGATIVE
HPV 18 / 45: NEGATIVE
High risk HPV: POSITIVE — AB

## 2024-04-03 NOTE — Consult Note (Signed)
 [facility] Anesthesia Consultation  Bianca Hayes HYQ:[id number] DOB: [birth year] DOA: 04/03/2024 PCP: Vicente Graham, No   Requesting physician: Alise Appl, CNM Date of consultation: 04/03/24 Reason for consultation: Obesity during pregnancy  CHIEF COMPLAINT:  Obesity during pregnancy  HISTORY OF PRESENT ILLNESS: Bianca Hayes  is a 33 y.o. female with a known history of asthma, recurrent kidney infections, ovarian cyst, goiter causing displaced trachea, class 4 obesity, and hx gDM and gHTN.  PAST MEDICAL HISTORY:   Past Medical History:  Diagnosis Date   Anemia    Asthma    GERD (gastroesophageal reflux disease)    H/O cervical fracture    Ovarian cyst    Skull fracture (HCC)     PAST SURGICAL HISTORY:  Past Surgical History:  Procedure Laterality Date   BIOPSY THYROID      DENTAL SURGERY      SOCIAL HISTORY:  Social History   Tobacco Use   Smoking status: Never   Smokeless tobacco: Never  Substance Use Topics   Alcohol use: No    FAMILY HISTORY:  Family History  Problem Relation Age of Onset   Thyroid  disease Mother    Hypertension Mother    Congestive Heart Failure Mother    COPD Mother    Hyperlipidemia Mother    CAD Mother    Stroke Mother    Heart failure Mother    Heart failure Father    CAD Father    Hyperlipidemia Father    Hypertension Father    Immunodeficiency Father    Hypertension Maternal Grandmother    Diabetes Maternal Grandmother    Hypertension Maternal Grandfather    Diabetes Maternal Grandfather    Hypertension Paternal Grandmother    Diabetes Paternal Grandmother    Hypertension Paternal Grandfather    Diabetes Paternal Grandfather     DRUG ALLERGIES:  Allergies  Allergen Reactions   Coconut Flavoring Agent (Non-Screening) Anaphylaxis   Iodine Hives and Shortness Of Breath   Bactrim [Sulfamethoxazole-Trimethoprim] Rash   Penicillins Rash    REVIEW OF SYSTEMS:   RESPIRATORY: No cough,  shortness of breath, wheezing.  CARDIOVASCULAR: No chest pain, orthopnea, edema.  HEMATOLOGY: No anemia, easy bruising or bleeding SKIN: No rash or lesion. NEUROLOGIC: No tingling, numbness, weakness.  PSYCHIATRY: No anxiety or depression.   MEDICATIONS AT HOME:  Prior to Admission medications   Medication Sig Start Date End Date Taking? Authorizing Provider  aspirin  EC 81 MG tablet Take 2 tablets (162 mg total) by mouth daily. Swallow whole. Start at [redacted] weeks pregnant 03/25/24   Alise Appl, CNM  Prenatal Vit-Fe Fumarate-FA (PRENATAL PO) Take by mouth daily.    [provider]      PHYSICAL EXAMINATION:   VITAL SIGNS: Last menstrual period 12/14/2023.  GENERAL:  33 y.o.-year-old patient no acute distress.  HEENT: Head atraumatic, normocephalic. Oropharynx and nasopharynx clear. MP IV, TM distance >3 cm, normal mouth opening. Poor dentition with multiple missing and broken teeth. LUNGS: No use of accessory muscles of respiration.   EXTREMITIES: No pedal edema, cyanosis, or clubbing.  NEUROLOGIC: normal gait PSYCHIATRIC: The patient is alert and oriented x 3.  SKIN: No obvious rash, lesion, or ulcer.    IMPRESSION AND PLAN:   Bianca Hayes  is a 33 y.o. female presenting with obesity during pregnancy. BMI is currently 54 at 17 3/[redacted] weeks gestation.   We discussed analgesic options during labor including epidural analgesia. Discussed that in obesity there can be increased difficulty with epidural placement or even  failure of successful epidural. We also discussed that even after successful epidural placement there is increased risk of catheter migration out of the epidural space that would require catheter replacement. Discussed use of epidural vs spinal vs GA if cesarean delivery is required. Discussed increased risk of difficult intubation during pregnancy should an emergency cesarean delivery be required. Also discussed the limitations of [facility] as a community hospital  including but not limited to blood products, staffing, and imaging and diagnostic capabilities.  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 Her airway exam is poor.  She appears SOB while sitting for consult.  This patient is at high risk of complications of anesthesia and I strongly recommend transfer of OB care to a facility with a higher maternal level of care designation.  She is not appropriate for OB care at [facility].  I have discussed all above with the patient and her family member Drucie Georgia via phone.  They understand and agree.  All questions answered and concerns addressed.

## 2024-04-06 ENCOUNTER — Ambulatory Visit: Payer: Self-pay | Admitting: Certified Nurse Midwife

## 2024-04-09 DIAGNOSIS — Z3A18 18 weeks gestation of pregnancy: Secondary | ICD-10-CM | POA: Diagnosis not present

## 2024-04-09 DIAGNOSIS — Z419 Encounter for procedure for purposes other than remedying health state, unspecified: Secondary | ICD-10-CM | POA: Diagnosis not present

## 2024-04-09 DIAGNOSIS — S3991XA Unspecified injury of abdomen, initial encounter: Secondary | ICD-10-CM | POA: Diagnosis not present

## 2024-04-09 DIAGNOSIS — O99891 Other specified diseases and conditions complicating pregnancy: Secondary | ICD-10-CM | POA: Diagnosis not present

## 2024-04-22 ENCOUNTER — Other Ambulatory Visit

## 2024-04-22 ENCOUNTER — Other Ambulatory Visit (HOSPITAL_COMMUNITY)
Admission: RE | Admit: 2024-04-22 | Discharge: 2024-04-22 | Disposition: A | Discharge status: Home / Self Care | Source: Ambulatory Visit | Attending: Advanced Practice Midwife | Admitting: Advanced Practice Midwife

## 2024-04-22 ENCOUNTER — Ambulatory Visit (INDEPENDENT_AMBULATORY_CARE_PROVIDER_SITE_OTHER): Admitting: Advanced Practice Midwife

## 2024-04-22 ENCOUNTER — Encounter: Payer: Self-pay | Admitting: Advanced Practice Midwife

## 2024-04-22 VITALS — BP 132/88 | HR 96 | Wt 387.4 lb

## 2024-04-22 DIAGNOSIS — O99212 Obesity complicating pregnancy, second trimester: Secondary | ICD-10-CM | POA: Diagnosis not present

## 2024-04-22 DIAGNOSIS — Z131 Encounter for screening for diabetes mellitus: Secondary | ICD-10-CM

## 2024-04-22 DIAGNOSIS — Z3A20 20 weeks gestation of pregnancy: Secondary | ICD-10-CM | POA: Diagnosis not present

## 2024-04-22 DIAGNOSIS — O09299 Supervision of pregnancy with other poor reproductive or obstetric history, unspecified trimester: Secondary | ICD-10-CM

## 2024-04-22 DIAGNOSIS — Z8619 Personal history of other infectious and parasitic diseases: Secondary | ICD-10-CM

## 2024-04-22 DIAGNOSIS — Z8632 Personal history of gestational diabetes: Secondary | ICD-10-CM | POA: Diagnosis not present

## 2024-04-22 DIAGNOSIS — O0992 Supervision of high risk pregnancy, unspecified, second trimester: Secondary | ICD-10-CM | POA: Diagnosis not present

## 2024-04-22 NOTE — Progress Notes (Signed)
 Routine Prenatal Care Visit  Subjective  Bianca Hayes is a 33 y.o. [id number] at [redacted]w[redacted]d being seen today for ongoing prenatal care.  She is currently monitored for the following issues for this high-risk pregnancy and has Supervision of high risk pregnancy, antepartum; Obesity affecting pregnancy; History of gestational diabetes mellitus (GDM) in prior pregnancy, currently pregnant; and History of gestational hypertension on their problem list.  ----------------------------------------------------------------------------------- Patient reports she finds herself sleeping on her stomach. Can't sleep on back due to difficulty breathing. Reviewed safe sleeping recommendations. She is aware that a transfer of care referral has been sent for [facility] care/delivery. Anesthesia recommends delivery at tertiary care due to airway concerns. Self swab today for TOC trich. 1 hr gtt today. Contractions: Not present. Vag. Bleeding: None.  Movement: Present. Leaking Fluid denies.  ----------------------------------------------------------------------------------- The following portions of the patient's history were reviewed and updated as appropriate: allergies, current medications, past family history, past medical history, past social history, past surgical history and problem list. Problem list updated.  Objective  Blood pressure 132/88, pulse 96, weight (!) 387 lb 6.4 oz (175.7 kg), last menstrual period 12/14/2023. Pregravid weight 350 lb (158.8 kg) Total Weight Gain 37 lb 6.4 oz (17 kg) Urinalysis: Urine Protein    Urine Glucose    Fetal Status: Fetal Heart Rate (bpm): 130   Movement: Present     General:  Alert, oriented and cooperative. Patient is in no acute distress.  Skin: Skin is warm and dry. No rash noted.   Cardiovascular: Normal heart rate noted  Respiratory: Normal respiratory effort, no problems with respiration noted  Abdomen: Soft, gravid, appropriate for gestational age. Pain/Pressure: Present      Pelvic:  Cervical exam deferred        Extremities: Normal range of motion.  Edema: Mild pitting, slight indentation  Mental Status: Normal mood and affect. Normal behavior. Normal judgment and thought content.   Assessment   33 y.o. [id number] at [redacted]w[redacted]d by  09/08/2024, by Ultrasound presenting for routine prenatal visit  Plan   SIXTH Problems (from 03/18/24 to present)     Problem Noted Diagnosed Resolved   Obesity affecting pregnancy 02/05/2014 by Loralyn Sander, CMA  No   Overview Signed 03/18/2024 10:42 AM by Loralyn Sander, CMA  Prepregnancy BMI = 42. -> Body mass index is 48.52 kg/(m^2).  Recommended wt gain = 0-5 kg (gained 19 kg at her 1st consult appt at 19 wks). Total weight gain: 17.554 kg. Counseled patient re: increased risks associated w/ obesity/increased weight gain.  [x]  sees nutritionist at [facility] [x]  baseline HELLP WNL (PLT 169). UP/C not collected at that time.   --Seen in triage 07/04/14. Initially elevated BPs that resolved when proper cuff size was used. HELLP wnl, plts stable at 188. UP/C 0.15 -> 24 hour urine 293.  [x]  early 1 hr GTT elevated at [facility] -> early 3 hr nml at [facility] (82, 144, 98, 94) [x]  prenatal anesthesia consult in 3rd trimester [ ]  ensure regular exercise - will walk on her days off, as much as possible  Fetal testing - see supervision section           Preterm labor symptoms and general obstetric precautions including but not limited to vaginal bleeding, contractions, leaking of fluid and fetal movement were reviewed in detail with the patient. Please refer to After Visit Summary for other counseling recommendations.   Return in about 4 weeks (around 05/20/2024) for rob if transfer to [facility] not yet complete.  Slater Rains, CNM 04/22/2024 9:53 AM

## 2024-04-23 LAB — GLUCOSE, 1 HOUR GESTATIONAL: Gestational Diabetes Screen: 117 mg/dL (ref 70–139)

## 2024-04-23 LAB — CERVICOVAGINAL ANCILLARY ONLY
Chlamydia: NEGATIVE
Comment: NEGATIVE
Comment: NEGATIVE
Comment: NORMAL
Neisseria Gonorrhea: NEGATIVE
Trichomonas: NEGATIVE

## 2024-04-24 ENCOUNTER — Ambulatory Visit: Payer: Self-pay | Admitting: Advanced Practice Midwife

## 2024-05-09 DIAGNOSIS — Z419 Encounter for procedure for purposes other than remedying health state, unspecified: Secondary | ICD-10-CM | POA: Diagnosis not present

## 2024-05-14 DIAGNOSIS — O0992 Supervision of high risk pregnancy, unspecified, second trimester: Secondary | ICD-10-CM | POA: Diagnosis not present

## 2024-05-14 DIAGNOSIS — N9489 Other specified conditions associated with female genital organs and menstrual cycle: Secondary | ICD-10-CM | POA: Diagnosis not present

## 2024-05-14 DIAGNOSIS — Z3A21 21 weeks gestation of pregnancy: Secondary | ICD-10-CM | POA: Diagnosis not present

## 2024-05-14 DIAGNOSIS — Z6841 Body Mass Index (BMI) 40.0 and over, adult: Secondary | ICD-10-CM | POA: Diagnosis not present

## 2024-05-14 DIAGNOSIS — Z1389 Encounter for screening for other disorder: Secondary | ICD-10-CM | POA: Diagnosis not present

## 2024-05-14 DIAGNOSIS — O99212 Obesity complicating pregnancy, second trimester: Secondary | ICD-10-CM | POA: Diagnosis not present

## 2024-05-14 DIAGNOSIS — Z8679 Personal history of other diseases of the circulatory system: Secondary | ICD-10-CM | POA: Diagnosis not present

## 2024-05-14 DIAGNOSIS — R87612 Low grade squamous intraepithelial lesion on cytologic smear of cervix (LGSIL): Secondary | ICD-10-CM | POA: Diagnosis not present
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
## 2024-05-14 NOTE — Progress Notes (Signed)
 Book My 60 Months by [employer] given to patient and reviewed how to contact Clinic and Nurse triage. Reviewed with patient, Contacting Your Provider.  Reviewed with patient Table of Contents with specific instruction to review Common Concerns and Recommended Treatment on  to be aware of over the counter medications and treatments safe in pregnancy (on paper handout given).   A few clinic business cards were given to patient.  QR code sticker on book shown to patient- patient can scan code to look at [facility] resources available.  Today you were given Ready, Set, Baby: A guide to welcoming your new family member.  We have provided a handout on community breastfeeding resources/support.  3.   Ready, Set, Baby classes offered through [facility]  http://www.wolf.info/  Current schedule (may vary seasonally)   English- Monday @ 1pm and 3pm  Spanish- Tuesday @ 12pm    Home BP cuff order and supporting documents sent to [facility] on 05/14/24

## 2024-05-18 ENCOUNTER — Other Ambulatory Visit: Payer: Self-pay

## 2024-05-18 ENCOUNTER — Emergency Department

## 2024-05-18 ENCOUNTER — Inpatient Hospital Stay
Admission: EM | Admit: 2024-05-18 | Discharge: 2024-05-20 | DRG: 832 | Attending: Obstetrics and Gynecology | Admitting: Obstetrics and Gynecology

## 2024-05-18 ENCOUNTER — Encounter: Payer: Self-pay | Admitting: Intensive Care

## 2024-05-18 DIAGNOSIS — O99512 Diseases of the respiratory system complicating pregnancy, second trimester: Secondary | ICD-10-CM | POA: Diagnosis not present

## 2024-05-18 DIAGNOSIS — J4520 Mild intermittent asthma, uncomplicated: Secondary | ICD-10-CM

## 2024-05-18 DIAGNOSIS — Z8249 Family history of ischemic heart disease and other diseases of the circulatory system: Secondary | ICD-10-CM

## 2024-05-18 DIAGNOSIS — J9811 Atelectasis: Secondary | ICD-10-CM | POA: Diagnosis not present

## 2024-05-18 DIAGNOSIS — O0992 Supervision of high risk pregnancy, unspecified, second trimester: Secondary | ICD-10-CM

## 2024-05-18 DIAGNOSIS — A7740 Ehrlichiosis, unspecified: Secondary | ICD-10-CM | POA: Diagnosis not present

## 2024-05-18 DIAGNOSIS — D72819 Decreased white blood cell count, unspecified: Secondary | ICD-10-CM

## 2024-05-18 DIAGNOSIS — O9A212 Injury, poisoning and certain other consequences of external causes complicating pregnancy, second trimester: Secondary | ICD-10-CM | POA: Diagnosis not present

## 2024-05-18 DIAGNOSIS — Z333 Pregnant state, gestational carrier: Secondary | ICD-10-CM

## 2024-05-18 DIAGNOSIS — Z3A22 22 weeks gestation of pregnancy: Secondary | ICD-10-CM | POA: Diagnosis not present

## 2024-05-18 DIAGNOSIS — Z880 Allergy status to penicillin: Secondary | ICD-10-CM

## 2024-05-18 DIAGNOSIS — W010XXA Fall on same level from slipping, tripping and stumbling without subsequent striking against object, initial encounter: Secondary | ICD-10-CM | POA: Diagnosis not present

## 2024-05-18 DIAGNOSIS — O99891 Other specified diseases and conditions complicating pregnancy: Secondary | ICD-10-CM

## 2024-05-18 DIAGNOSIS — Z91041 Radiographic dye allergy status: Secondary | ICD-10-CM | POA: Diagnosis not present

## 2024-05-18 DIAGNOSIS — O99212 Obesity complicating pregnancy, second trimester: Principal | ICD-10-CM | POA: Diagnosis present

## 2024-05-18 DIAGNOSIS — Z7982 Long term (current) use of aspirin: Secondary | ICD-10-CM

## 2024-05-18 DIAGNOSIS — J45909 Unspecified asthma, uncomplicated: Secondary | ICD-10-CM | POA: Diagnosis not present

## 2024-05-18 DIAGNOSIS — E66813 Obesity, class 3: Secondary | ICD-10-CM | POA: Diagnosis present

## 2024-05-18 DIAGNOSIS — O99892 Other specified diseases and conditions complicating childbirth: Secondary | ICD-10-CM | POA: Diagnosis not present

## 2024-05-18 DIAGNOSIS — Y92009 Unspecified place in unspecified non-institutional (private) residence as the place of occurrence of the external cause: Secondary | ICD-10-CM

## 2024-05-18 DIAGNOSIS — Z881 Allergy status to other antibiotic agents status: Secondary | ICD-10-CM

## 2024-05-18 DIAGNOSIS — Z3A23 23 weeks gestation of pregnancy: Secondary | ICD-10-CM

## 2024-05-18 DIAGNOSIS — D696 Thrombocytopenia, unspecified: Secondary | ICD-10-CM | POA: Diagnosis not present

## 2024-05-18 DIAGNOSIS — D61818 Other pancytopenia: Secondary | ICD-10-CM | POA: Diagnosis present

## 2024-05-18 DIAGNOSIS — S8001XA Contusion of right knee, initial encounter: Secondary | ICD-10-CM | POA: Diagnosis not present

## 2024-05-18 DIAGNOSIS — R7989 Other specified abnormal findings of blood chemistry: Secondary | ICD-10-CM | POA: Diagnosis not present

## 2024-05-18 DIAGNOSIS — B6000 Babesiosis, unspecified: Secondary | ICD-10-CM

## 2024-05-18 DIAGNOSIS — R7401 Elevation of levels of liver transaminase levels: Secondary | ICD-10-CM | POA: Diagnosis not present

## 2024-05-18 DIAGNOSIS — Z3A21 21 weeks gestation of pregnancy: Secondary | ICD-10-CM | POA: Diagnosis not present

## 2024-05-18 DIAGNOSIS — O98812 Other maternal infectious and parasitic diseases complicating pregnancy, second trimester: Secondary | ICD-10-CM | POA: Diagnosis not present

## 2024-05-18 DIAGNOSIS — W19XXXA Unspecified fall, initial encounter: Secondary | ICD-10-CM | POA: Diagnosis not present

## 2024-05-18 DIAGNOSIS — O99112 Other diseases of the blood and blood-forming organs and certain disorders involving the immune mechanism complicating pregnancy, second trimester: Secondary | ICD-10-CM | POA: Diagnosis not present

## 2024-05-18 DIAGNOSIS — Z833 Family history of diabetes mellitus: Secondary | ICD-10-CM

## 2024-05-18 DIAGNOSIS — E042 Nontoxic multinodular goiter: Secondary | ICD-10-CM | POA: Diagnosis not present

## 2024-05-18 DIAGNOSIS — D649 Anemia, unspecified: Secondary | ICD-10-CM | POA: Diagnosis not present

## 2024-05-18 DIAGNOSIS — R000 Tachycardia, unspecified: Secondary | ICD-10-CM | POA: Diagnosis not present

## 2024-05-18 DIAGNOSIS — R0602 Shortness of breath: Secondary | ICD-10-CM | POA: Diagnosis not present

## 2024-05-18 DIAGNOSIS — O26892 Other specified pregnancy related conditions, second trimester: Secondary | ICD-10-CM | POA: Diagnosis not present

## 2024-05-18 DIAGNOSIS — O132 Gestational [pregnancy-induced] hypertension without significant proteinuria, second trimester: Secondary | ICD-10-CM | POA: Diagnosis not present

## 2024-05-18 DIAGNOSIS — R0989 Other specified symptoms and signs involving the circulatory and respiratory systems: Secondary | ICD-10-CM | POA: Diagnosis not present

## 2024-05-18 DIAGNOSIS — N839 Noninflammatory disorder of ovary, fallopian tube and broad ligament, unspecified: Secondary | ICD-10-CM | POA: Diagnosis not present

## 2024-05-18 LAB — CBC WITH DIFFERENTIAL/PLATELET
Abs Immature Granulocytes: 0.01 K/uL (ref 0.00–0.07)
Basophils Absolute: 0 K/uL (ref 0.0–0.1)
Basophils Relative: 2 %
Eosinophils Absolute: 0 K/uL (ref 0.0–0.5)
Eosinophils Relative: 1 %
HCT: 28.2 % — ABNORMAL LOW (ref 36.0–46.0)
Hemoglobin: 9.5 g/dL — ABNORMAL LOW (ref 12.0–15.0)
Immature Granulocytes: 1 %
Lymphocytes Relative: 28 %
Lymphs Abs: 0.3 K/uL — ABNORMAL LOW (ref 0.7–4.0)
MCH: 29.9 pg (ref 26.0–34.0)
MCHC: 33.7 g/dL (ref 30.0–36.0)
MCV: 88.7 fL (ref 80.0–100.0)
Monocytes Absolute: 0.3 K/uL (ref 0.1–1.0)
Monocytes Relative: 29 %
Neutro Abs: 0.4 K/uL — CL (ref 1.7–7.7)
Neutrophils Relative %: 39 %
Platelets: 123 K/uL — ABNORMAL LOW (ref 150–400)
RBC: 3.18 MIL/uL — ABNORMAL LOW (ref 3.87–5.11)
RDW: 15.5 % (ref 11.5–15.5)
Smear Review: NORMAL
WBC: 1 K/uL — CL (ref 4.0–10.5)
nRBC: 0 % (ref 0.0–0.2)

## 2024-05-18 LAB — CBC
HCT: 28.4 % — ABNORMAL LOW (ref 36.0–46.0)
Hemoglobin: 9.2 g/dL — ABNORMAL LOW (ref 12.0–15.0)
MCH: 28.9 pg (ref 26.0–34.0)
MCHC: 32.4 g/dL (ref 30.0–36.0)
MCV: 89.3 fL (ref 80.0–100.0)
Platelets: 117 K/uL — ABNORMAL LOW (ref 150–400)
RBC: 3.18 MIL/uL — ABNORMAL LOW (ref 3.87–5.11)
RDW: 15.4 % (ref 11.5–15.5)
WBC: 1.1 K/uL — CL (ref 4.0–10.5)
nRBC: 0 % (ref 0.0–0.2)

## 2024-05-18 LAB — URINALYSIS, ROUTINE W REFLEX MICROSCOPIC
Bacteria, UA: NONE SEEN
Bilirubin Urine: NEGATIVE
Glucose, UA: NEGATIVE mg/dL
Ketones, ur: 5 mg/dL — AB
Leukocytes,Ua: NEGATIVE
Nitrite: NEGATIVE
Protein, ur: 30 mg/dL — AB
Specific Gravity, Urine: 1.025 (ref 1.005–1.030)
pH: 5 (ref 5.0–8.0)

## 2024-05-18 LAB — RETICULOCYTES
Immature Retic Fract: 0.5 % — ABNORMAL LOW (ref 2.3–15.9)
RBC.: 2.98 MIL/uL — ABNORMAL LOW (ref 3.87–5.11)
Retic Count, Absolute: 62 K/uL (ref 19.0–186.0)
Retic Ct Pct: 2.1 % (ref 0.4–3.1)

## 2024-05-18 LAB — COMPREHENSIVE METABOLIC PANEL WITH GFR
ALT: 56 U/L — ABNORMAL HIGH (ref 0–44)
AST: 66 U/L — ABNORMAL HIGH (ref 15–41)
Albumin: 2.7 g/dL — ABNORMAL LOW (ref 3.5–5.0)
Alkaline Phosphatase: 78 U/L (ref 38–126)
Anion gap: 10 (ref 5–15)
BUN: 9 mg/dL (ref 6–20)
CO2: 24 mmol/L (ref 22–32)
Calcium: 8.2 mg/dL — ABNORMAL LOW (ref 8.9–10.3)
Chloride: 105 mmol/L (ref 98–111)
Creatinine, Ser: 0.43 mg/dL — ABNORMAL LOW (ref 0.44–1.00)
GFR, Estimated: >60 mL/min (ref >60)
Glucose, Bld: 88 mg/dL (ref 70–99)
Potassium: 3.7 mmol/L (ref 3.5–5.1)
Sodium: 139 mmol/L (ref 135–145)
Total Bilirubin: 0.6 mg/dL (ref 0.0–1.2)
Total Protein: 5.9 g/dL — ABNORMAL LOW (ref 6.5–8.1)

## 2024-05-18 LAB — LIPASE, BLOOD: Lipase: 22 U/L (ref 11–51)

## 2024-05-18 MED ORDER — ZOLPIDEM TARTRATE 5 MG PO TABS: 5.0000 mg | ORAL_TABLET | Freq: Every evening | ORAL | Status: DC | PRN

## 2024-05-18 MED ORDER — CALCIUM CARBONATE ANTACID 500 MG PO CHEW: 1.0000 | CHEWABLE_TABLET | Freq: Three times a day (TID) | ORAL | Status: DC | PRN

## 2024-05-18 MED ORDER — ACETAMINOPHEN 325 MG PO TABS: 650.0000 mg | ORAL_TABLET | Freq: Four times a day (QID) | ORAL | Status: DC | PRN

## 2024-05-18 NOTE — ED Notes (Signed)
 MD Claudene made aware staff was unable to obtain [facility] in triage. Per MD Claudene, verbal order for US . No other treatment at this time.

## 2024-05-18 NOTE — OB Triage Note (Signed)
 Patient is a  33 yo, G6 P3, at 23 weeks 6 days. Patient was transferred from ED via wheelchair due to low WBC. This finding was secondary to her initial visit reason of a fall that occurred around 10am. Patient denies any vaginal bleeding or LOF. Patient reports +FM. Monitors applied and assessing. VSS. Initial fetal heart tone 155 bpm. Sebastian, CNM notified of patients arrival to unit. Plan to admit for observation

## 2024-05-18 NOTE — H&P (Cosign Needed)
 ADMISSION HISTORY AND PHYSICAL NOTE   History of Present Illness: Bianca Hayes is a 33 y.o. [id number] at [redacted]w[redacted]d admitted from the ED.  She presented to the Ed status post fall this morning. She states she tripped over her dog and fell to her right knee. She experienced dizziness and headache 3 hours after her fall.  She also notes worsening shortness of breath since her fall. She was evaluated in the ED and noted to have pancytopenia, thrombocytopenia, and elevated liver functions. She denies hitting her abdomen and states she has been feeling fetal movement. She denies loss of fluid and vaginal bleeding. Her history is significant for pregnancy at 23 weeks 6 days, obesity , asthma, GERD, anemia, gestational diabetes in previous pregnancy and gestational hypertension in previous pregnancy.   Patient reports the fetal movement as active. Patient reports uterine contraction  activity as none. Patient reports  vaginal bleeding as none. Patient describes fluid per vagina as None. Fetal presentation is unknown.  Patient Active Problem List   Diagnosis Date Noted   Labor and delivery, indication for care 05/18/2024   Supervision of high risk pregnancy, antepartum 03/18/2024   History of gestational diabetes mellitus (GDM) in prior pregnancy, currently pregnant 03/18/2024   History of gestational hypertension 03/18/2024   Obesity affecting pregnancy 02/05/2014    Past Medical History:  Diagnosis Date   Anemia    Asthma    GERD (gastroesophageal reflux disease)    H/O cervical fracture    Ovarian cyst    Skull fracture (HCC)     Past Surgical History:  Procedure Laterality Date   BIOPSY THYROID      DENTAL SURGERY      OB History  Gravida Para Term Preterm AB Living  6 3 3  2 3   SAB IAB Ectopic Multiple Live Births  2    3    # Outcome Date GA Lbr Len/2nd Weight Sex Type Anes PTL Lv  6 Current           5 SAB 2022          4 SAB 2020          3 Term 07/17/14    F Vag-Spont    LIV  2 Term 11/18/10    F Vag-Spont   LIV  1 Term 12/05/09    F Vag-Spont   LIV    Social History   Socioeconomic History   Marital status: Single    Spouse name: Not on file   Number of children: 3   Years of education: Not on file   Highest education level: Not on file  Occupational History    Employer: [employer]  Tobacco Use   Smoking status: Never   Smokeless tobacco: Never  Vaping Use   Vaping status: Never Used  Substance and Sexual Activity   Alcohol use: No   Drug use: No   Sexual activity: Yes    Partners: Male    Birth control/protection: None  Other Topics Concern   Not on file  Social History Narrative   Not on file   Social Drivers of Health   Financial Resource Strain: Medium Risk (03/18/2024)   Overall Financial Resource Strain (CARDIA)    Difficulty of Paying Living Expenses: Somewhat hard  Food Insecurity: Food Insecurity Present (03/18/2024)   Hunger Vital Sign    Worried About Running Out of Food in the Last Year: Sometimes true    Ran Out of Food in the Last Year: Sometimes  true  Transportation Needs: No Transportation Needs (03/18/2024)   PRAPARE - Administrator, Civil Service (Medical): No    Lack of Transportation (Non-Medical): No  Physical Activity: Insufficiently Active (03/18/2024)   Exercise Vital Sign    Days of Exercise per Week: 2 days    Minutes of Exercise per Session: 30 min  Stress: No Stress Concern Present (03/18/2024)   [employer] of Occupational Health - Occupational Stress Questionnaire    Feeling of Stress : Only a little  Social Connections: Moderately Isolated (03/18/2024)   Social Connection and Isolation Panel    Frequency of Communication with Friends and Family: More than three times a week    Frequency of Social Gatherings with Friends and Family: More than three times a week    Attends Religious Services: More than 4 times per year    Active Member of [employer] or Organizations: No    Attends Museum/gallery exhibitions officer: Never    Marital Status: Divorced    Family History  Problem Relation Age of Onset   Thyroid  disease Mother    Hypertension Mother    Congestive Heart Failure Mother    COPD Mother    Hyperlipidemia Mother    CAD Mother    Stroke Mother    Heart failure Mother    Heart failure Father    CAD Father    Hyperlipidemia Father    Hypertension Father    Immunodeficiency Father    Hypertension Maternal Grandmother    Diabetes Maternal Grandmother    Hypertension Maternal Grandfather    Diabetes Maternal Grandfather    Hypertension Paternal Grandmother    Diabetes Paternal Grandmother    Hypertension Paternal Grandfather    Diabetes Paternal Grandfather     Allergies  Allergen Reactions   Coconut Flavoring Agent (Non-Screening) Anaphylaxis   Iodine Hives and Shortness Of Breath   Bactrim [Sulfamethoxazole-Trimethoprim] Rash   Penicillins Rash    Medications Prior to Admission  Medication Sig Dispense Refill Last Dose/Taking   aspirin  EC 81 MG tablet Take 2 tablets (162 mg total) by mouth daily. Swallow whole. Start at [redacted] weeks pregnant 60 tablet 12    Prenatal Vit-Fe Fumarate-FA (PRENATAL PO) Take by mouth daily.       Review of Systems - Negative except as mentioned in HPI  Vitals:  BP (!) 141/66   Pulse (!) 110   Temp 98 F (36.7 C) (Oral)   Resp 20   Ht 5' 9 (1.753 m)   Wt (!) 175.5 kg   LMP 12/14/2023   SpO2 100%   BMI 57.15 kg/m  Physical Examination: CONSTITUTIONAL: Well-developed, morbidly obese female in no acute distress.  HENT:  Normocephalic, atraumatic, External right and left ear normal. Oropharynx is clear and moist EYES: Conjunctivae and EOM are normal.  NECK: Normal range of motion, supple, no masses SKIN: Skin is warm and dry. No rash noted. Not diaphoretic. No erythema. No pallor. NEUROLOGIC: Alert and oriented to person, place, and time. Normal muscle tone coordination.  PSYCHIATRIC: Normal mood and affect. Normal  behavior. Normal judgment and thought content. CARDIOVASCULAR: Normal heart rate noted, regular rhythm RESPIRATORY: Effort and breath sounds normal,tachypnea  ABDOMEN: Soft, nontender, nondistended, gravid. MUSCULOSKELETAL: Normal range of motion. No edema and no tenderness.   Cervix: deferred   Membranes:intact Fetal Monitoring:FHTs auscultated 155 Tocometer: soft   Labs:  Results for orders placed or performed during the hospital encounter of 05/18/24 (from the past 24 hours)  Urinalysis, Routine  w reflex microscopic -Urine, Clean Catch   Collection Time: 05/18/24  3:55 PM  Result Value Ref Range   Color, Urine YELLOW (A) YELLOW   APPearance CLEAR (A) CLEAR   Specific Gravity, Urine 1.025 1.005 - 1.030   pH 5.0 5.0 - 8.0   Glucose, UA NEGATIVE NEGATIVE mg/dL   Hgb urine dipstick MODERATE (A) NEGATIVE   Bilirubin Urine NEGATIVE NEGATIVE   Ketones, ur 5 (A) NEGATIVE mg/dL   Protein, ur 30 (A) NEGATIVE mg/dL   Nitrite NEGATIVE NEGATIVE   Leukocytes,Ua NEGATIVE NEGATIVE   RBC / HPF 0-5 0 - 5 RBC/hpf   WBC, UA 0-5 0 - 5 WBC/hpf   Bacteria, UA NONE SEEN NONE SEEN   Squamous Epithelial / HPF 0-5 0 - 5 /HPF   Mucus PRESENT   CBC   Collection Time: 05/18/24  5:51 PM  Result Value Ref Range   WBC 1.1 (LL) 4.0 - 10.5 K/uL   RBC 3.18 (L) 3.87 - 5.11 MIL/uL   Hemoglobin 9.2 (L) 12.0 - 15.0 g/dL   HCT 71.5 (L) 63.9 - 53.9 %   MCV 89.3 80.0 - 100.0 fL   MCH 28.9 26.0 - 34.0 pg   MCHC 32.4 30.0 - 36.0 g/dL   RDW 84.5 88.4 - 84.4 %   Platelets 117 (L) 150 - 400 K/uL   nRBC 0.0 0.0 - 0.2 %  Comprehensive metabolic panel   Collection Time: 05/18/24  5:51 PM  Result Value Ref Range   Sodium 139 135 - 145 mmol/L   Potassium 3.7 3.5 - 5.1 mmol/L   Chloride 105 98 - 111 mmol/L   CO2 24 22 - 32 mmol/L   Glucose, Bld 88 70 - 99 mg/dL   BUN 9 6 - 20 mg/dL   Creatinine, Ser 9.56 (L) 0.44 - 1.00 mg/dL   Calcium  8.2 (L) 8.9 - 10.3 mg/dL   Total Protein 5.9 (L) 6.5 - 8.1 g/dL   Albumin  2.7 (L) 3.5 - 5.0 g/dL   AST 66 (H) 15 - 41 U/L   ALT 56 (H) 0 - 44 U/L   Alkaline Phosphatase 78 38 - 126 U/L   Total Bilirubin 0.6 0.0 - 1.2 mg/dL   GFR, Estimated >39 >39 mL/min   Anion gap 10 5 - 15  Lipase, blood   Collection Time: 05/18/24  5:51 PM  Result Value Ref Range   Lipase 22 11 - 51 U/L  CBC with Differential/Platelet   Collection Time: 05/18/24  5:51 PM  Result Value Ref Range   WBC 1.0 (LL) 4.0 - 10.5 K/uL   RBC 3.18 (L) 3.87 - 5.11 MIL/uL   Hemoglobin 9.5 (L) 12.0 - 15.0 g/dL   HCT 71.7 (L) 63.9 - 53.9 %   MCV 88.7 80.0 - 100.0 fL   MCH 29.9 26.0 - 34.0 pg   MCHC 33.7 30.0 - 36.0 g/dL   RDW 84.4 88.4 - 84.4 %   Platelets 123 (L) 150 - 400 K/uL   nRBC 0.0 0.0 - 0.2 %   Neutrophils Relative % 39 %   Neutro Abs 0.4 (LL) 1.7 - 7.7 K/uL   Lymphocytes Relative 28 %   Lymphs Abs 0.3 (L) 0.7 - 4.0 K/uL   Monocytes Relative 29 %   Monocytes Absolute 0.3 0.1 - 1.0 K/uL   Eosinophils Relative 1 %   Eosinophils Absolute 0.0 0.0 - 0.5 K/uL   Basophils Relative 2 %   Basophils Absolute 0.0 0.0 - 0.1 K/uL  WBC Morphology See Note    RBC Morphology See Note    Smear Review Normal platelet morphology    Immature Granulocytes 1 %   Abs Immature Granulocytes 0.01 0.00 - 0.07 K/uL   Tear Drop Cells PRESENT    Ovalocytes PRESENT     Imaging Studies: US  OB Limited Result Date: 05/18/2024 CLINICAL DATA:  Fall.  Pelvic pain. EXAM: LIMITED OBSTETRIC ULTRASOUND COMPARISON:  None Available. FINDINGS: Evaluation is very limited due to body habitus. Number of Fetuses: 1 Heart Rate:  144 bpm Movement: Detected Presentation: Cephalic Placental Location: Posterior Previa: No Amniotic Fluid (Subjective):  Within normal limits. BPD: 5.6 cm 23 w  0 d MATERNAL FINDINGS: Cervix:  Not visualized. Uterus/Adnexae: Right adnexal cyst measures up to 21 cm similar to prior ultrasound. This is suboptimally evaluated on this ultrasound. IMPRESSION: Single live intrauterine pregnancy.  No acute  findings. This exam is performed on an emergent basis and does not comprehensively evaluate fetal size, dating, or anatomy; follow-up complete OB US  should be considered if further fetal assessment is warranted. Electronically Signed   By: Vanetta Chou M.D.   On: 05/18/2024 17:04   DG Knee Complete 4 Views Right Result Date: 05/18/2024 EXAM: 4 or more VIEW(S) XRAY OF THE RIGHT KNEE 05/18/2024 04:47:29 PM COMPARISON: None available. CLINICAL HISTORY: Fall onto right knee. Pt fell on right knee; shortness of breath. FINDINGS: BONES AND JOINTS: No acute fracture. No focal osseous lesion. No joint dislocation. No significant joint effusion. No significant degenerative changes. SOFT TISSUES: The soft tissues are unremarkable. Mild limitation secondary to patient body habitus. IMPRESSION: 1. No acute fracture or dislocation. Electronically signed by: Rockey Kilts MD 05/18/2024 04:55 PM EDT RP Workstation: [id number]   DG Chest Portable 1 View Result Date: 05/18/2024 CLINICAL DATA:  Fall, shortness of breath.  Pregnant patient. EXAM: PORTABLE CHEST 1 VIEW COMPARISON:  04/07/2016 FINDINGS: Lung volumes are low. Stable heart size and mediastinal contours. Bronchovascular crowding versus vascular congestion. Mild atelectasis at the lung bases. No pneumothorax or large pleural effusion. Anterior lordotic positioning. On limited assessment, no acute osseous findings IMPRESSION: Low lung volumes with bronchovascular crowding versus vascular congestion. Mild bibasilar atelectasis. Electronically Signed   By: Andrea Gasman M.D.   On: 05/18/2024 16:55     Assessment and Plan: Patient Active Problem List   Diagnosis Date Noted   Labor and delivery, indication for care 05/18/2024   Supervision of high risk pregnancy, antepartum 03/18/2024   History of gestational diabetes mellitus (GDM) in prior pregnancy, currently pregnant 03/18/2024   History of gestational hypertension 03/18/2024   Obesity affecting  pregnancy 02/05/2014   Admit to Antenatal Dr. Janit consulted on plan. Orders placed for urgent Hospitalist  and hem-onc consult.SABRA Zelda Hummer, CNM

## 2024-05-18 NOTE — ED Provider Notes (Signed)
 [facility] Provider Note   Event Date/Time   First MD Initiated Contact with Patient 05/18/24 1854     (approximate) History  Fall  HPI Bianca Hayes is a 33 y.o. female with a stated past medical history of 23-week pregnancy who presents after a mechanical fall tripping over her dog and landing on the right knee as well as the right side.  Patient now complains of headache, pelvic pain, and right knee pain. ROS: Patient currently denies any vision changes, tinnitus, difficulty speaking, facial droop, sore throat, chest pain, shortness of breath, abdominal pain, nausea/vomiting/diarrhea, dysuria, or weakness/numbness/paresthesias in any extremity   Physical Exam  Triage Vital Signs: ED Triage Vitals  Encounter Vitals Group     BP 05/18/24 1550 (!) 144/85     Girls Systolic BP Percentile --      Girls Diastolic BP Percentile --      Boys Systolic BP Percentile --      Boys Diastolic BP Percentile --      Pulse Rate 05/18/24 1550 (!) 101     Resp 05/18/24 1550 20     Temp 05/18/24 1550 98.5 F (36.9 C)     Temp Source 05/18/24 1550 Oral     SpO2 05/18/24 1550 100 %     Weight 05/18/24 1552 (!) 387 lb (175.5 kg)     Height 05/18/24 1552 5' 9 (1.753 m)     Head Circumference --      Peak Flow --      Pain Score 05/18/24 1552 10     Pain Loc --      Pain Education --      Exclude from Growth Chart --    Most recent vital signs: Vitals:   05/18/24 2230 05/18/24 2231  BP:  125/67  Pulse:  (!) 104  Resp:    Temp:    SpO2: 99%    General: Awake, oriented x4. CV:  Good peripheral perfusion. Resp:  Normal effort. Abd:  No distention. Other:  Middle-aged morbidly obese Caucasian female resting comfortably in no acute distress.  Swelling to right knee with tenderness to palpation over the anterior portion of the knee joint ED Results / Procedures / Treatments  Labs (all labs ordered are listed, but only abnormal results are displayed) Labs  Reviewed  URINALYSIS, ROUTINE W REFLEX MICROSCOPIC - Abnormal; Notable for the following components:      Result Value   Color, Urine YELLOW (*)    APPearance CLEAR (*)    Hgb urine dipstick MODERATE (*)    Ketones, ur 5 (*)    Protein, ur 30 (*)    All other components within normal limits  CBC - Abnormal; Notable for the following components:   WBC 1.1 (*)    RBC 3.18 (*)    Hemoglobin 9.2 (*)    HCT 28.4 (*)    Platelets 117 (*)    All other components within normal limits  COMPREHENSIVE METABOLIC PANEL WITH GFR - Abnormal; Notable for the following components:   Creatinine, Ser 0.43 (*)    Calcium  8.2 (*)    Total Protein 5.9 (*)    Albumin 2.7 (*)    AST 66 (*)    ALT 56 (*)    All other components within normal limits  CBC WITH DIFFERENTIAL/PLATELET - Abnormal; Notable for the following components:   WBC 1.0 (*)    RBC 3.18 (*)    Hemoglobin 9.5 (*)  HCT 28.2 (*)    Platelets 123 (*)    Neutro Abs 0.4 (*)    Lymphs Abs 0.3 (*)    All other components within normal limits  LIPASE, BLOOD  PATHOLOGIST SMEAR REVIEW  LACTATE DEHYDROGENASE  PROTIME-INR  APTT  FIBRINOGEN   RETICULOCYTES  VITAMIN B12  FOLATE  BRAIN NATRIURETIC PEPTIDE  CBC WITH DIFFERENTIAL/PLATELET  COPPER , SERUM  HAPTOGLOBIN   EKG ED ECG REPORT I, Artist MARLA Kerns, the attending physician, personally viewed and interpreted this ECG. Date: 05/18/2024 EKG Time: 1545 Rate: 100 Rhythm: Tachycardic sinus rhythm QRS Axis: normal Intervals: normal ST/T Wave abnormalities: normal Narrative Interpretation: Tachycardic sinus rhythm.  No evidence of acute ischemia RADIOLOGY ED MD interpretation: Limited OB ultrasound shows single live intrauterine pregnancy  Chest and knee x-ray showed no evidence of acute abnormalities - All radiology independently interpreted and agree with radiology assessment Official radiology report(s): US  OB Limited Result Date: 05/18/2024 CLINICAL DATA:  Fall.  Pelvic  pain. EXAM: LIMITED OBSTETRIC ULTRASOUND COMPARISON:  None Available. FINDINGS: Evaluation is very limited due to body habitus. Number of Fetuses: 1 Heart Rate:  144 bpm Movement: Detected Presentation: Cephalic Placental Location: Posterior Previa: No Amniotic Fluid (Subjective):  Within normal limits. BPD: 5.6 cm 23 w  0 d MATERNAL FINDINGS: Cervix:  Not visualized. Uterus/Adnexae: Right adnexal cyst measures up to 21 cm similar to prior ultrasound. This is suboptimally evaluated on this ultrasound. IMPRESSION: Single live intrauterine pregnancy.  No acute findings. This exam is performed on an emergent basis and does not comprehensively evaluate fetal size, dating, or anatomy; follow-up complete OB US  should be considered if further fetal assessment is warranted. Electronically Signed   By: Vanetta Chou M.D.   On: 05/18/2024 17:04   DG Knee Complete 4 Views Right Result Date: 05/18/2024 EXAM: 4 or more VIEW(S) XRAY OF THE RIGHT KNEE 05/18/2024 04:47:29 PM COMPARISON: None available. CLINICAL HISTORY: Fall onto right knee. Pt fell on right knee; shortness of breath. FINDINGS: BONES AND JOINTS: No acute fracture. No focal osseous lesion. No joint dislocation. No significant joint effusion. No significant degenerative changes. SOFT TISSUES: The soft tissues are unremarkable. Mild limitation secondary to patient body habitus. IMPRESSION: 1. No acute fracture or dislocation. Electronically signed by: Rockey Kilts MD 05/18/2024 04:55 PM EDT RP Workstation: [id number]   DG Chest Portable 1 View Result Date: 05/18/2024 CLINICAL DATA:  Fall, shortness of breath.  Pregnant patient. EXAM: PORTABLE CHEST 1 VIEW COMPARISON:  04/07/2016 FINDINGS: Lung volumes are low. Stable heart size and mediastinal contours. Bronchovascular crowding versus vascular congestion. Mild atelectasis at the lung bases. No pneumothorax or large pleural effusion. Anterior lordotic positioning. On limited assessment, no acute osseous  findings IMPRESSION: Low lung volumes with bronchovascular crowding versus vascular congestion. Mild bibasilar atelectasis. Electronically Signed   By: Andrea Gasman M.D.   On: 05/18/2024 16:55   PROCEDURES: Critical Care performed: No Procedures MEDICATIONS ORDERED IN ED: Medications  acetaminophen  (TYLENOL ) tablet 650 mg (has no administration in time range)  zolpidem  (AMBIEN ) tablet 5 mg (has no administration in time range)  calcium  carbonate (TUMS - dosed in mg elemental calcium ) chewable tablet 200 mg of elemental calcium  (has no administration in time range)   IMPRESSION / MDM / ASSESSMENT AND PLAN / ED COURSE  I reviewed the triage vital signs and the nursing notes.  The patient is on the cardiac monitor to evaluate for evidence of arrhythmia and/or significant heart rate changes. Patient's presentation is most consistent with acute presentation with potential threat to life or bodily function.  This patient presents to the ED for concern of mechanical fall, this involves an extensive number of treatment options, and is a complaint that carries with it a high risk of complications and morbidity.  The differential diagnosis includes right knee fracture, placental abruption, anemia, rib fracture Co morbidities that complicate the patient evaluation  Morbid obesity Additional history obtained:  Additional history obtained from patient's caregiver at bedside  External records from outside source obtained and reviewed including outpatient [facility] visit at [facility] on 05/14/2024 Lab Tests:  I Ordered, and personally interpreted labs.  The pertinent results include: Pancytopenia, WBC 1.1, hemoglobin 9.2, platelet 117 Transaminitis: AST 66, ALT 56 Albumin 2.7 Total protein 5.9 Calcium  8.2  Imaging Studies ordered:  I ordered imaging studies including OB ultrasound and chest/knee x-ray  I independently visualized and interpreted imaging which showed no  evidence of acute abnormalities  I agree with the radiologist interpretation Cardiac Monitoring: / EKG:  The patient was maintained on a cardiac monitor.  I personally viewed and interpreted the cardiac monitored which showed an underlying rhythm of: Tachycardic sinus rhythm Consultations Obtained:  I requested consultation with the Dr.Brahmanday and hematology oncology,  and discussed lab and imaging findings as well as pertinent plan - they recommend: Admission for serial labs Problem List / ED Course / Critical interventions / Medication management  Pancytopenia, fall, right knee pain  Dispo: Admit to OB/GYN with medicine and hematology oncology following       FINAL CLINICAL IMPRESSION(S) / ED DIAGNOSES   Final diagnoses:  Fall, initial encounter  Leukopenia, unspecified type  Anemia, unspecified type  Thrombocytopenia (HCC)   Rx / DC Orders   ED Discharge Orders     None      Note:  This document was prepared using Dragon voice recognition software and may include unintentional dictation errors.   Jossie Artist POUR, MD 05/18/24 [phone]

## 2024-05-18 NOTE — Telephone Encounter (Signed)
 Secure epic chat received stating:  Her dog tripped her about 3hrs ago & she fell on her knee then rolled over on her back. Did not hit stomach, She has shortness of breath, pelvic pain, really bad headaches right now.   Called patient. Patient confirms above. States she is still SOB. Reports fetal movement. No LOF, bleeding, ctx's. Still having pelvic and back pain. Currently at work. Went in because it's a new job. Advised patient to be evaluated at closest hospital as soon as possible. Patient states she has to get ride. Advised patient to call 911. Patient verbalized understanding.

## 2024-05-18 NOTE — ED Provider Triage Note (Signed)
 Emergency Medicine Provider Triage Evaluation Note  Bianca Hayes , a 33 y.o. female  was evaluated in triage.  Pt sent by PCP complaining of right knee pain following a mechanical fall after tripping over her dog.  Patient then states 3 hours later she began to feel dizzy with a headache and increasing shortness of breath.  Also notes bilateral pelvic pain and suprapubic pain associated sx include urinary frequency noticed today.  Review of Systems  Positive:  Negative: Vaginal bleeding   Physical Exam  BP (!) 144/85 (BP Location: Right Arm)   Pulse (!) 101   Temp 98.5 F (36.9 C) (Oral)   Resp 20   Ht 5' 9 (1.753 m)   Wt (!) 175.5 kg   LMP 12/14/2023   SpO2 100%   BMI 57.15 kg/m  Gen:   Awake, no distress   Resp:  Normal effort  MSK:   Moves extremities without difficulty  Other:  Tenderness over quadricep tendon.  Mild supra pubic tenderness with palpation.  Medical Decision Making  Medically screening exam initiated at 3:59 PM.  Appropriate orders placed.  Bianca Hayes was informed that the remainder of the evaluation will be completed by another provider, this initial triage assessment does not replace that evaluation, and the importance of remaining in the ED until their evaluation is complete.  Unable to assess fetal tones in triage   Griffin Dewilde A, PA-C 05/18/24 1606

## 2024-05-18 NOTE — ED Triage Notes (Signed)
 Patient is [redacted]w[redacted]d pregnant. Patient tripped over dog and hit right knee and then fell on side to ground. Now c/o headache and pelvic pain. Also c/o SOB.  Also reports she is urinating more frequently since fall  Denies CP

## 2024-05-19 DIAGNOSIS — W19XXXA Unspecified fall, initial encounter: Secondary | ICD-10-CM

## 2024-05-19 DIAGNOSIS — D61818 Other pancytopenia: Secondary | ICD-10-CM

## 2024-05-19 DIAGNOSIS — Z3A21 21 weeks gestation of pregnancy: Secondary | ICD-10-CM

## 2024-05-19 DIAGNOSIS — R7401 Elevation of levels of liver transaminase levels: Secondary | ICD-10-CM

## 2024-05-19 DIAGNOSIS — B6000 Babesiosis, unspecified: Secondary | ICD-10-CM

## 2024-05-19 DIAGNOSIS — J45909 Unspecified asthma, uncomplicated: Secondary | ICD-10-CM

## 2024-05-19 DIAGNOSIS — Z7689 Persons encountering health services in other specified circumstances: Secondary | ICD-10-CM | POA: Diagnosis not present

## 2024-05-19 DIAGNOSIS — O98812 Other maternal infectious and parasitic diseases complicating pregnancy, second trimester: Secondary | ICD-10-CM

## 2024-05-19 DIAGNOSIS — D72819 Decreased white blood cell count, unspecified: Secondary | ICD-10-CM

## 2024-05-19 DIAGNOSIS — O26892 Other specified pregnancy related conditions, second trimester: Secondary | ICD-10-CM

## 2024-05-19 DIAGNOSIS — A7740 Ehrlichiosis, unspecified: Secondary | ICD-10-CM

## 2024-05-19 LAB — CBC WITH DIFFERENTIAL/PLATELET
Abs Immature Granulocytes: 0.01 K/uL (ref 0.00–0.07)
Basophils Absolute: 0 K/uL (ref 0.0–0.1)
Basophils Relative: 2 %
Eosinophils Absolute: 0 K/uL (ref 0.0–0.5)
Eosinophils Relative: 1 %
HCT: 27.6 % — ABNORMAL LOW (ref 36.0–46.0)
Hemoglobin: 9.2 g/dL — ABNORMAL LOW (ref 12.0–15.0)
Immature Granulocytes: 1 %
Lymphocytes Relative: 23 %
Lymphs Abs: 0.3 K/uL — ABNORMAL LOW (ref 0.7–4.0)
MCH: 29.5 pg (ref 26.0–34.0)
MCHC: 33.3 g/dL (ref 30.0–36.0)
MCV: 88.5 fL (ref 80.0–100.0)
Monocytes Absolute: 0.2 K/uL (ref 0.1–1.0)
Monocytes Relative: 17 %
Neutro Abs: 0.6 K/uL — ABNORMAL LOW (ref 1.7–7.7)
Neutrophils Relative %: 56 %
Platelets: 112 K/uL — ABNORMAL LOW (ref 150–400)
RBC: 3.12 MIL/uL — ABNORMAL LOW (ref 3.87–5.11)
RDW: 15.5 % (ref 11.5–15.5)
Smear Review: NORMAL
WBC: 1.1 K/uL — CL (ref 4.0–10.5)
nRBC: 0 % (ref 0.0–0.2)

## 2024-05-19 LAB — BASIC METABOLIC PANEL WITH GFR
Anion gap: 9 (ref 5–15)
BUN: 9 mg/dL (ref 6–20)
CO2: 23 mmol/L (ref 22–32)
Calcium: 8 mg/dL — ABNORMAL LOW (ref 8.9–10.3)
Chloride: 106 mmol/L (ref 98–111)
Creatinine, Ser: 0.49 mg/dL (ref 0.44–1.00)
GFR, Estimated: >60 mL/min (ref >60)
Glucose, Bld: 102 mg/dL — ABNORMAL HIGH (ref 70–99)
Potassium: 3.5 mmol/L (ref 3.5–5.1)
Sodium: 138 mmol/L (ref 135–145)

## 2024-05-19 LAB — HEPATIC FUNCTION PANEL
ALT: 53 U/L — ABNORMAL HIGH (ref 0–44)
AST: 48 U/L — ABNORMAL HIGH (ref 15–41)
Albumin: 2.3 g/dL — ABNORMAL LOW (ref 3.5–5.0)
Alkaline Phosphatase: 72 U/L (ref 38–126)
Bilirubin, Direct: 0.2 mg/dL (ref 0.0–0.2)
Indirect Bilirubin: 0.5 mg/dL (ref 0.3–0.9)
Total Bilirubin: 0.7 mg/dL (ref 0.0–1.2)
Total Protein: 5.2 g/dL — ABNORMAL LOW (ref 6.5–8.1)

## 2024-05-19 LAB — PROTIME-INR
INR: 1 (ref 0.8–1.2)
Prothrombin Time: 14.1 s (ref 11.4–15.2)

## 2024-05-19 LAB — BRAIN NATRIURETIC PEPTIDE: B Natriuretic Peptide: 22.6 pg/mL (ref 0.0–100.0)

## 2024-05-19 LAB — LACTATE DEHYDROGENASE: LDH: 127 U/L (ref 98–192)

## 2024-05-19 LAB — FIBRINOGEN: Fibrinogen: 476 mg/dL — ABNORMAL HIGH (ref 210–475)

## 2024-05-19 LAB — VITAMIN B12: Vitamin B-12: 187 pg/mL (ref 180–914)

## 2024-05-19 LAB — HIV ANTIBODY (ROUTINE TESTING W REFLEX): HIV Screen 4th Generation wRfx: NONREACTIVE

## 2024-05-19 LAB — PATHOLOGIST SMEAR REVIEW

## 2024-05-19 LAB — APTT: aPTT: 31 s (ref 24–36)

## 2024-05-19 LAB — FOLATE: Folate: 28 ng/mL (ref >5.9)

## 2024-05-19 MED ORDER — ONDANSETRON HCL 4 MG PO TABS: 4.0000 mg | ORAL_TABLET | Freq: Four times a day (QID) | ORAL | Status: DC | PRN

## 2024-05-19 MED ORDER — ACETAMINOPHEN 650 MG RE SUPP: 650.0000 mg | Freq: Four times a day (QID) | RECTAL | Status: DC | PRN

## 2024-05-19 MED ORDER — IPRATROPIUM-ALBUTEROL 0.5-2.5 (3) MG/3ML IN SOLN: 3.0000 mL | Freq: Four times a day (QID) | RESPIRATORY_TRACT | Status: DC | PRN

## 2024-05-19 MED ORDER — SODIUM CHLORIDE 0.9 % IV SOLN: INTRAVENOUS | Status: DC

## 2024-05-19 MED ORDER — AZITHROMYCIN 250 MG PO TABS
500.0000 mg | ORAL_TABLET | Freq: Every day | ORAL | Status: DC
  Administered 2024-05-19: 500 mg via ORAL
  Filled 2024-05-19: qty 2

## 2024-05-19 MED ORDER — MAGNESIUM HYDROXIDE 400 MG/5ML PO SUSP: 30.0000 mL | Freq: Every day | ORAL | Status: DC | PRN

## 2024-05-19 MED ORDER — ACETAMINOPHEN 325 MG PO TABS
650.0000 mg | ORAL_TABLET | Freq: Four times a day (QID) | ORAL | Status: DC | PRN
  Administered 2024-05-19: 650 mg via ORAL
  Filled 2024-05-19: qty 2

## 2024-05-19 MED ORDER — PRENATAL MULTIVITAMIN CH
1.0000 | ORAL_TABLET | Freq: Every day | ORAL | Status: DC
  Administered 2024-05-19: 1 via ORAL
  Filled 2024-05-19: qty 1

## 2024-05-19 MED ORDER — ATOVAQUONE 750 MG/5ML PO SUSP
750.0000 mg | Freq: Two times a day (BID) | ORAL | Status: DC
  Administered 2024-05-19: 750 mg via ORAL
  Filled 2024-05-19 (×2): qty 5

## 2024-05-19 MED ORDER — ONDANSETRON HCL 4 MG/2ML IJ SOLN: 4.0000 mg | Freq: Four times a day (QID) | INTRAMUSCULAR | Status: DC | PRN

## 2024-05-19 NOTE — Progress Notes (Signed)
 FHT 147. Patient reports +fetal movement, denies contractions, vaginal bleeding, and leakage of fluid.

## 2024-05-19 NOTE — Consult Note (Addendum)
 Initial Consultation Note        [place]   PATIENT NAME: Bianca Hayes    MR#:  [id number]  DATE OF BIRTH:  11-Jan-1991  DATE OF ADMISSION:  05/18/2024  PRIMARY CARE PHYSICIAN: Pcp, No   Patient is coming from: Home  REQUESTING/REFERRING PHYSICIAN: Janit Lenis, MD  CHIEF COMPLAINT:   Chief Complaint  Patient presents with   Fall    HISTORY OF PRESENT ILLNESS:  Bianca Hayes is a 33 y.o. obese Caucasian female with medical history significant for 23-week pregnancy, asthma, GERD, anemia, gestational diabetes mellitus and pregnancy-induced hypertension, who presented to the emergency room with acute onset of mechanical fall tripping over her dog.  She fell on her right knee and had subsequent right knee pain as well as headache, dyspnea and diaphoresis and pelvic pain.  She admits to nausea without vomiting.  No fever or chills.  She denies any abdominal pain.  No chest pain or palpitations.  No cough or wheezing.  No paresthesias or focal muscle weakness.  No tinnitus or vertigo.  No bleeding diathesis.  ED Course: When the patient came to the ER, heart rate was 101 with blood pressure of 144/85 with otherwise normal vital signs.  Labs revealed calcium  of 8.2, AST of 66 ALT 56 and total protein 5.9 with albumin 2.7 with otherwise unremarkable CMP.  LDH was 127 and BNP 22.6 with folate of 28.  CBC showed significant leukopenia of 1 with neutropenia of 0.4 and hemoglobin 9.5 and hematocrit 9.2 slightly lower than previous levels in May of this year and platelets of 123 compared to 149 in May of this year. EKG as reviewed by me :  EKG showed normal sinus rhythm with rate 100 with prolonged QT interval with QTc of 461 MS and Q waves inferiorly. Imaging: Portable chest x-ray showed low lung volumes with bronchovascular crowding versus vascular congestion with mild bibasilar atelectasis. 4 view knee x-ray revealed no acute fracture or dislocation.  The patient be admitted to the  medical telemetry bed for further evaluation and management. PAST MEDICAL HISTORY:   Past Medical History:  Diagnosis Date   Anemia    Asthma    GERD (gastroesophageal reflux disease)    H/O cervical fracture    Ovarian cyst    Skull fracture (HCC)   -Obesity  PAST SURGICAL HISTORY:   Past Surgical History:  Procedure Laterality Date   BIOPSY THYROID      DENTAL SURGERY      SOCIAL HISTORY:   Social History   Tobacco Use   Smoking status: Never   Smokeless tobacco: Never  Substance Use Topics   Alcohol use: No    FAMILY HISTORY:   Family History  Problem Relation Age of Onset   Thyroid  disease Mother    Hypertension Mother    Congestive Heart Failure Mother    COPD Mother    Hyperlipidemia Mother    CAD Mother    Stroke Mother    Heart failure Mother    Heart failure Father    CAD Father    Hyperlipidemia Father    Hypertension Father    Immunodeficiency Father    Hypertension Maternal Grandmother    Diabetes Maternal Grandmother    Hypertension Maternal Grandfather    Diabetes Maternal Grandfather    Hypertension Paternal Grandmother    Diabetes Paternal Grandmother    Hypertension Paternal Grandfather    Diabetes Paternal Grandfather     DRUG ALLERGIES:   Allergies  Allergen  Reactions   Coconut Flavoring Agent (Non-Screening) Anaphylaxis   Iodine Hives and Shortness Of Breath   Bactrim [Sulfamethoxazole-Trimethoprim] Rash   Penicillins Rash    REVIEW OF SYSTEMS:   ROS As per history of present illness. All pertinent systems were reviewed above. Constitutional, HEENT, cardiovascular, respiratory, GI, GU, musculoskeletal, neuro, psychiatric, endocrine, integumentary and hematologic systems were reviewed and are otherwise negative/unremarkable except for positive findings mentioned above in the HPI.   MEDICATIONS AT HOME:   Prior to Admission medications   Medication Sig Start Date End Date Taking? Authorizing Provider  aspirin  EC 81 MG  tablet Take 2 tablets (162 mg total) by mouth daily. Swallow whole. Start at [redacted] weeks pregnant 03/25/24   Sebastian Sham, CNM  Prenatal Vit-Fe Fumarate-FA (PRENATAL PO) Take by mouth daily.    [provider]      VITAL SIGNS:  Blood pressure 125/67, pulse (!) 104, temperature 98 F (36.7 C), temperature source Oral, resp. rate 20, height 5' 9 (1.753 m), weight (!) 175.5 kg, last menstrual period 12/14/2023, SpO2 99%.  PHYSICAL EXAMINATION:  Physical Exam  GENERAL:  33 y.o.-year-old obese Caucasian female patient lying in the bed with no acute distress.  EYES: Pupils equal, round, reactive to light and accommodation. No scleral icterus. Extraocular muscles intact.  HEENT: Head atraumatic, normocephalic. Oropharynx and nasopharynx clear.  NECK:  Supple, no jugular venous distention. No thyroid  enlargement, no tenderness.  LUNGS: Normal breath sounds bilaterally, no wheezing, rales,rhonchi or crepitation. No use of accessory muscles of respiration.  CARDIOVASCULAR: Regular rate and rhythm, S1, S2 normal. No murmurs, rubs, or gallops.  ABDOMEN: Soft, nondistended, nontender. Bowel sounds present. No organomegaly or mass.  EXTREMITIES: No pedal edema, cyanosis, or clubbing.  NEUROLOGIC: Cranial nerves II through XII are intact. Muscle strength 5/5 in all extremities. Sensation intact. Gait not checked. Musculoskeletal: Mild right knee swelling and anterior tenderness with adequate range of motion without deformity. PSYCHIATRIC: The patient is alert and oriented x 3.  Normal affect and good eye contact. SKIN: No obvious rash, lesion, or ulcer.   LABORATORY PANEL:   CBC Recent Labs  Lab 05/18/24 1751  WBC 1.0*  1.1*  HGB 9.5*  9.2*  HCT 28.2*  28.4*  PLT 123*  117*   ------------------------------------------------------------------------------------------------------------------  Chemistries  Recent Labs  Lab 05/18/24 1751  NA 139  K 3.7  CL 105  CO2 24  GLUCOSE  88  BUN 9  CREATININE 0.43*  CALCIUM  8.2*  AST 66*  ALT 56*  ALKPHOS 78  BILITOT 0.6   ------------------------------------------------------------------------------------------------------------------  Cardiac Enzymes No results for input(s): TROPONINI in the last 168 hours. ------------------------------------------------------------------------------------------------------------------  RADIOLOGY:  US  OB Limited Result Date: 05/18/2024 CLINICAL DATA:  Fall.  Pelvic pain. EXAM: LIMITED OBSTETRIC ULTRASOUND COMPARISON:  None Available. FINDINGS: Evaluation is very limited due to body habitus. Number of Fetuses: 1 Heart Rate:  144 bpm Movement: Detected Presentation: Cephalic Placental Location: Posterior Previa: No Amniotic Fluid (Subjective):  Within normal limits. BPD: 5.6 cm 23 w  0 d MATERNAL FINDINGS: Cervix:  Not visualized. Uterus/Adnexae: Right adnexal cyst measures up to 21 cm similar to prior ultrasound. This is suboptimally evaluated on this ultrasound. IMPRESSION: Single live intrauterine pregnancy.  No acute findings. This exam is performed on an emergent basis and does not comprehensively evaluate fetal size, dating, or anatomy; follow-up complete OB US  should be considered if further fetal assessment is warranted. Electronically Signed   By: Vanetta Chou M.D.   On: 05/18/2024 17:04  DG Knee Complete 4 Views Right Result Date: 05/18/2024 EXAM: 4 or more VIEW(S) XRAY OF THE RIGHT KNEE 05/18/2024 04:47:29 PM COMPARISON: None available. CLINICAL HISTORY: Fall onto right knee. Pt fell on right knee; shortness of breath. FINDINGS: BONES AND JOINTS: No acute fracture. No focal osseous lesion. No joint dislocation. No significant joint effusion. No significant degenerative changes. SOFT TISSUES: The soft tissues are unremarkable. Mild limitation secondary to patient body habitus. IMPRESSION: 1. No acute fracture or dislocation. Electronically signed by: Rockey Kilts MD 05/18/2024  04:55 PM EDT RP Workstation: [id number]   DG Chest Portable 1 View Result Date: 05/18/2024 CLINICAL DATA:  Fall, shortness of breath.  Pregnant patient. EXAM: PORTABLE CHEST 1 VIEW COMPARISON:  04/07/2016 FINDINGS: Lung volumes are low. Stable heart size and mediastinal contours. Bronchovascular crowding versus vascular congestion. Mild atelectasis at the lung bases. No pneumothorax or large pleural effusion. Anterior lordotic positioning. On limited assessment, no acute osseous findings IMPRESSION: Low lung volumes with bronchovascular crowding versus vascular congestion. Mild bibasilar atelectasis. Electronically Signed   By: Andrea Gasman M.D.   On: 05/18/2024 16:55      IMPRESSION AND PLAN:  Assessment and Plan: * Pancytopenia (HCC) - This is in a 23-week pregnant. - Pancytopenia is of unclear etiology. - She will be admitted to a medical telemetry bed. - Will follow her CBC in AM. - Hematology consultation was obtained by Dr. Janit. - We will obtain a blood smear. - She would likely need a bone marrow biopsy.   Fall at home, initial encounter - This is essentially a mechanical fall. - She has subsequent right knee contusion and pain. - Pain management will be provided. - PT evaluation will be obtained.  Asthma, chronic She will be placed on as needed DuoNeb.   DVT prophylaxis: SCDs.  Medical prophylaxis is contraindicated due to thrombocytopenia.   Advanced Care Planning:  Code Status: full code. Family Communication:  The plan of care was discussed in details with the patient (and family). I answered all questions. The patient agreed to proceed with the above mentioned plan. Further management will depend upon hospital course. Disposition Plan: Back to previous home environment Consults called: Hematology All the records are reviewed and case discussed with ED provider.  Thank you Dr. Janit for allowing me to participate in the care of this very pleasant lady.  Madison DELENA Peaches M.D on 05/19/2024 at 2:11 AM  [facility]   From 7 PM-7 AM, contact night-coverage [email or web address]  CC: Primary care physician; Pcp, No

## 2024-05-19 NOTE — Assessment & Plan Note (Addendum)
-   This is in a 23-week pregnant. - Pancytopenia is of unclear etiology. - She will be admitted to a medical telemetry bed. - Will follow her CBC in AM. - Hematology consultation was obtained by Dr. Janit. - We will obtain a blood smear. - She would likely need a bone marrow biopsy.

## 2024-05-19 NOTE — Discharge Instructions (Signed)
 Rent/Utility/Housing  Agency Name: [facility] Agency Address: [address] [place], [place] [address] Phone: [phone] Email: [email or web address] Website: [email or web address].org Service(s) Offered: Housing services, self-sufficiency, congregate meal program, weatherization program, Field seismologist program, emergency food assistance,  housing counseling, home ownership program, wheels -towork program.  Agency Name: [facility] Address: [address], [place], [place] [address] Phone: [phone] (8a-4p) [phone] (8p- 10p) Email: [email or web address] Website: [email or web address] Service(s) Offered: A program for homeless and/or needy men that includes one-on-one counseling, life skills training and job rehabilitation.  Agency Name: [facility] [place] Address: [address], [place], [place] [address] Phone: [phone] Website: www.[email or web address].org Service(s) Offered: Assistance to needy in emergency with utility bills, heating fuel, and prescriptions. Shelter for homeless 7pm-7am. February 21, 2017 15  Agency Name: [facility] [place] (Developmentally Disabled) Address: [address], [place], [place] [address] Phone: [phone] Contact Person: Cathleen Corti Email: [email or web address] Website: [email or web address] Service(s) Offered: Helps individuals with developmental disabilities move from housing that is more restrictive to homes where they  can achieve greater independence and have more  opportunities.  Agency Name: [facility] Address: [address], [place], [place] [address] Phone: [phone] Email: [email or web address] Website: www.[email or web address].org Service(s) Offered: Provides affordable housing for low-income families, elderly, and disabled individuals. Offer a wide range of  programs and services, from financial planning to  afterschool and summer programs.  Agency Name: [facility] Address: [address] [place], [place] [address] Phone: [phone] Service(s) Offered: Child support services; child welfare services; food stamps; Medicaid; work first family assistance; and aid with fuel,  rent, food and medicine.  Agency Name: [facility] [place] [employer]. Address: [facility] [address], [place], [place]  [address] Phone: [phone] Website: www.familyabuseservices.org Service(s) Offered: 24 hour Crisis Line: [phone]; 24 hour Emergency Shelter; Transitional Housing; Support Groups; Scientist, physiological; [employer]; Hispanic Outreach: [phone];  Visitation Center: [phone].  Agency Name: [facility] [place]. Address: [address] [place], [place] [address] Phone: [phone] Service(s) Offered: CAP Services; Home and [employer]; Individual or Group Supports; Respite Care Non-Institutional Nursing;  Residential Supports; Respite Care and Personal Care Services; Transportation; Family and Friends Night; Recreational Activities; Three Nutritious Meals/Snacks; Consultation with Registered Dietician; Twenty-four hour Registered Nurse Access; Daily and [employer]; Camp Green Leaves; [place] for the [employer] (During Summer Months) Bingo Night (Every  Wednesday Night); Special Populations Dance Night  (Every Tuesday Night); Professional Hair Care Services.  Agency Name: [facility] Address: [address], [place], [place] [address] Phone: [phone] Contact Person: Jabier Mutton Website: [email or web address].Physicist, medical) Offered: Residential treatment facility for women; food and  clothing, educational & employment development and  transportation to work; Counsellor of financial skills;  parenting and family reunification; emotional and spiritual  support;  transitional housing for program graduates.  Agency Name: [facility] Address: [address], [place], [place] [address] Phone: [phone] Email: [email or web address] Website: [email or web address].es Service(s) Offered: Public housing units for elderly, disabled, and low income people; housing choice vouchers for income eligible  applicants; shelter plus care vouchers; and Psychologist, clinical.  Agency Name: [facility] [employer] Address: [address] [place], [place] [address] Phone: [phone] Email: [email or web address] Website: www.habitatalamance.org Service(s) Offered: Build houses for families in need of decent housing. Each adult in the family must invest 200 hours of labor on  someone else's house, work with volunteers to build their own house, attend classes  on budgeting, home maintenance, yard care, and attend homeowner association meetings.  Agency Name: [facility] Address: [address] [place], [place] [address] Phone: [phone] Website: www.rsli.org Service(s) Offered: Intermediate care facilities for intellectually delayed, Supervised Living in group homes for adults with developmental disabilities, Supervised Living for people who have dual diagnoses (MRMI), Independent Living, Supported Living, respite and a variety of CAP services, pre-vocational services, day supports, and [employer].  Agency Name: [place] [employer] [facility] Phone: [phone] Website: www.NCForeclosurePrevention.gov Service(s) Offered: Zero-interest, deferred loans to homeowners struggling to pay their mortgage. Call for more information.     Food Resources  Agency Name: [facility] Agency Address: [address], [place], [place] [address] Phone: [phone] Website: www.alamanceservices.org Service(s) Offered: Housing services, self-sufficiency, congregate meal program, weatherization program,  Event organiser program, emergency food assistance,  housing counseling, home ownership program, wheels - to work program.  Dole Food free for 60 and older at various locations from [employer], Monday-Friday:  [employer], [address] [place], [phone] -[facility], [address], [place] [phone]  -[place], [address], [place] [phone]  -[address], [address] [place], [phone]  Agency Name: [facility] Address: [phone] [address], [place], [place] [address] Phone: [phone] Website: www.alamancemow.org Service(s) Offered: Home delivered hot, frozen, and emergency  meals. Grocery assistance program which matches  volunteers one-on-one with seniors unable to grocery shop  for themselves. Must be 60 years and older; less than 20  hours of in-home aide service, limited or no driving ability;  live alone or with someone with a disability; live in  [place].  Agency Name: [facility]) Address: [address], [place], [place] [address] Phone: [phone] Service(s) Offered: Food is served to shut-ins, homeless, elderly, and low income people in the community every Saturday (11:30 am-12:30 pm) and Sunday (12:30 pm-1:30pm). Volunteers also offer help and encouragement in seeking employment,  and spiritual guidance.  Agency Name: Department of Social Services Address: [address] [place], [place] [address] Phone: [phone] Service(s) Offered: Child support services; child welfare services; food stamps; Medicaid; work first family assistance; and aid with fuel,  rent, food and medicine.  Agency Name: [facility] Address: [address], [place], [place] Phone: [phone] Website: www.dreamalign.com Services Offered: Monday 10:00am-12:00, 8:00pm-9:00pm, and Friday 10:00am-12:00.  Agency Name:  [facility] [place] Address: [address], [place], [place] [address] Phone: [phone] Website: www.alliedchurches.org Service(s) Offered: Serves weekday meals, open from 11:30 am- 1:00 pm., and 6:30-7:30pm, Monday-Wednesday-Friday distributes food 3:30-6pm, Monday-Wednesday-Friday.  Agency Name: [facility] Address: [address], [place], [place] Phone: [phone] Website: [email or web address].[email or web address].org Services Offered: Distributes food the 4th Saturday of the month, starting at 8:00 am  Agency Name: [facility] Address: [phone] [address], [place], [place] [address] Phone: [phone] Website: [email or web address] Service(s) Offered: Bread of life, weekly food pantry. Open Wednesdays from 10:00am-noon.  Agency Name: [facility] Address: [address] [place], [place], [place] Phone: [phone] Services Offered: Distributes food 9am-1pm, Monday-Thursday. Call for details.  Agency Name: [facility] Address: [address] [place], [place] [address] Phone: [phone] Website: firstbaptistburlington.[email or web address] Service(s) Offered: Games developer. Call for assistance.  Agency Name: [facility] Address: [address] [place], [place] [address] Phone: [phone] Service Offered: Emergency Food Pantry. Call for appointment.  Agency Name: [facility] Address: [phone]  [address] [place], [place] [address] Phone: [phone] Website: [email or web address] Services Offered: Games developer. Call for details  Agency Name: [facility] Address: [address] [place], [place] Phone: [phone] Website: [email or web address] Service(s) Offered: Emergency Food Pantry. Call for details.  Agency Name: Holiday representative Address: [address] [place], [place] [address] Phone: [phone] or [phone] Website:  [email or web address].[email or web address] Service(s) Offered: Distribute food 9am-11:30 am, Tuesday-Friday, and 1-3:30pm, Monday-Friday. Food pantry Monday-Friday 1pm-3pm, fresh items, Mon.-Wed.-Fri.  Agency Name: [facility] Address: [address] [place], [place] [address] Phone: [phone] Website: www.safealamance.org Services Offered: Distribute food Tues and Sats from 9:00am-noon. Closed 1st Saturday of each month. Call for details  Agency Name: [facility] Address: [address] [facility] [address], [place] [address] Phone: [phone]  Services Offered: Delivers meals every Thursday

## 2024-05-19 NOTE — Consult Note (Signed)
 Initial Consultation Note   Patient: Bianca Hayes FMW:[id number] DOB: 11/22/1990 PCP: Pcp, No DOA: 05/18/2024 DOS: the patient was seen and examined on 05/19/2024 Primary service: Janit Alm Agent, MD  Referring physician: Janit Reason for consult: Pancytopenia   Assessment and Plan:  Pancytopenia  Incidental finding of pancytopenia in 23w gestation pregnancy Admitted to med tele Hematology consultation was obtained by Dr. Janit Blood smear pending Needs a bone marrow biopsy Patient will be transferred to [facility] for further evaluation and management, which is being coordinated by [facility] and hematology  Second trimester pregnancy [id number] at 24+0 weeks' gestation Admitted to [facility] and management of pregnancy by their service until she transfers  Likely needs BID fetal heart tones but will defer to [facility] regarding their protocols   Fall at home, initial encounter Mechanical fall resulting in right knee contusion and pain Pain controlled at this time Defer PT evaluation given need for transfer   Asthma, chronic Started on as needed DuoNeb  Morbid/Class 3 obesity Body mass index is 57.15 kg/m.SABRA  Weight loss should be encouraged Outpatient PCP/bariatric medicine f/u encouraged Significantly low or high BMI is associated with higher medical risk including morbidity and mortality      TRH will sign off at present, please call us  again when needed.    HPI: Bianca Hayes is a 33 y.o. female with past medical history of asthma, anemia, gestational DM, and pregnancy-induced HTN who presented on 7/21 at 32 weeks' gestation with a mechanical fall.  She was found to have incidental pancytopenia and so was admitted.  Hematology will be consulted.  Her only injury from the fall is a R knee contusion.  She reports a mechanical fall when she slipped on dog urine.  Feeling ok, aware of plan for transfer to [facility].    Review of Systems: As mentioned in the history of present illness. All other  systems reviewed and are negative. Past Medical History:  Diagnosis Date   Anemia    Asthma    GERD (gastroesophageal reflux disease)    H/O cervical fracture    Ovarian cyst    Skull fracture (HCC)    Past Surgical History:  Procedure Laterality Date   BIOPSY THYROID      DENTAL SURGERY     Social History:  reports that she has never smoked. She has never used smokeless tobacco. She reports that she does not drink alcohol and does not use drugs.  Allergies  Allergen Reactions   Coconut Flavoring Agent (Non-Screening) Anaphylaxis   Iodine Hives and Shortness Of Breath   Bactrim [Sulfamethoxazole-Trimethoprim] Rash   Penicillins Rash    Family History  Problem Relation Age of Onset   Thyroid  disease Mother    Hypertension Mother    Congestive Heart Failure Mother    COPD Mother    Hyperlipidemia Mother    CAD Mother    Stroke Mother    Heart failure Mother    Heart failure Father    CAD Father    Hyperlipidemia Father    Hypertension Father    Immunodeficiency Father    Hypertension Maternal Grandmother    Diabetes Maternal Grandmother    Hypertension Maternal Grandfather    Diabetes Maternal Grandfather    Hypertension Paternal Grandmother    Diabetes Paternal Grandmother    Hypertension Paternal Grandfather    Diabetes Paternal Grandfather     Prior to Admission medications   Medication Sig Start Date End Date Taking? Authorizing Provider  aspirin  EC 81 MG  tablet Take 2 tablets (162 mg total) by mouth daily. Swallow whole. Start at [redacted] weeks pregnant 03/25/24   Sebastian Sham, CNM  Prenatal Vit-Fe Fumarate-FA (PRENATAL PO) Take by mouth daily.    [provider]    Physical Exam: Vitals:   05/19/24 0231 05/19/24 0235 05/19/24 0425 05/19/24 0726  BP: 113/60  (!) 121/57 109/69  Pulse: (!) 104  100 88  Resp:    (!) 22  Temp: 98.6 F (37 C)  98.7 F (37.1 C) 98.1 F (36.7 C)  TempSrc: Oral  Oral Oral  SpO2:  98% 98% 100%  Weight:      Height:        No intake or output data in the 24 hours ending 05/19/24 0735 Filed Weights   05/18/24 1552  Weight: (!) 175.5 kg   Exam:  General:  Appears calm and comfortable and is in NAD Eyes:   normal lids, iris ENT:  grossly normal hearing, lips & tongue, mmm; poor dentition Cardiovascular:  RRR. No LE edema.  Respiratory:   CTA bilaterally with no wheezes/rales/rhonchi.  Normal respiratory effort. Abdomen:  soft, gravid, NT; exam limited by habitus Skin:  no rash or induration seen on limited exam Musculoskeletal:  grossly normal tone BUE/BLE, good ROM, no bony abnormality Psychiatric:  grossly normal mood and affect, speech fluent and appropriate, AOx3 Neurologic:  CN 2-12 grossly intact, moves all extremities in coordinated fashion  Data Reviewed: I have reviewed the patient's lab results since admission.  Pertinent labs for today include:   Unremarkable BMP WBC 1.1, 5.7 on 5/29 Hgb 9.2, 10.9 on 5/29 Platelets 112, down from 149 on 5/29 ANC 627     Family Communication: None present Primary team communication: Communicated with primary team via Secure Chat Thank you very much for involving us  in the care of your patient.  Author: Delon Herald, MD 05/19/2024 7:32 AM  For on call review [email or web address].

## 2024-05-19 NOTE — Progress Notes (Addendum)
 [facility] OB-GYN ANTEPARTUM COMPREHENSIVE PROGRESS NOTE  Bianca Hayes is a 33 y.o. [id number] at [redacted]w[redacted]d who is admitted for pancytopenia.  Estimated Date of Delivery: 09/08/24 Fetal presentation is cephalic.  Length of Stay:  1 Days. Admitted 05/18/2024  Subjective: Bianca Hayes is being evaluated by hem-onc for pancytopenia. They recommend transfer to [facility] and bone marrow biopsy. Patient reports good fetal movement.  She reports no uterine contractions, no bleeding and no loss of fluid per vagina. She is still feeling mildly SOB.  Vitals:  Blood pressure 109/69, pulse 88, temperature 98.1 F (36.7 C), temperature source Oral, resp. rate (!) 22, height 5' 9 (1.753 m), weight (!) 175.5 kg, last menstrual period 12/14/2023, SpO2 100%. Physical Examination: CONSTITUTIONAL: Well-developed, well-nourished female in no acute distress.  HENT:  Normocephalic, atraumatic,  SKIN: Skin is warm and dry. No rash noted. Not diaphoretic. No erythema. No pallor. NEUROLGIC: Alert and oriented to person, place, and time.  PSYCHIATRIC: Normal mood and affect. Normal behavior. Normal judgment and thought content. CARDIOVASCULAR: Normal heart rate  RESPIRATORY: Effort normal, no increased WOB ABDOMEN: Soft, nontender, nondistended, gravid.   Fetal monitoring: FHR: 147 bpm  Uterine activity: No -Tracontractions per hour  Results for orders placed or performed during the hospital encounter of 05/18/24 (from the past 48 hours)  Urinalysis, Routine w reflex microscopic -Urine, Clean Catch     Status: Abnormal   Collection Time: 05/18/24  3:55 PM  Result Value Ref Range   Color, Urine YELLOW (A) YELLOW   APPearance CLEAR (A) CLEAR   Specific Gravity, Urine 1.025 1.005 - 1.030   pH 5.0 5.0 - 8.0   Glucose, UA NEGATIVE NEGATIVE mg/dL   Hgb urine dipstick MODERATE (A) NEGATIVE   Bilirubin Urine NEGATIVE NEGATIVE   Ketones, ur 5 (A) NEGATIVE mg/dL   Protein, ur 30 (A) NEGATIVE mg/dL   Nitrite NEGATIVE NEGATIVE    Leukocytes,Ua NEGATIVE NEGATIVE   RBC / HPF 0-5 0 - 5 RBC/hpf   WBC, UA 0-5 0 - 5 WBC/hpf   Bacteria, UA NONE SEEN NONE SEEN   Squamous Epithelial / HPF 0-5 0 - 5 /HPF   Mucus PRESENT     Comment: Performed at [facility], [address] [place], [place] [address]  CBC     Status: Abnormal   Collection Time: 05/18/24  5:51 PM  Result Value Ref Range   WBC 1.1 (LL) 4.0 - 10.5 K/uL    Comment: This critical result has verified and been called to [facility] by Francina Ona on 07 21 2025 at 1850, and has been read back.    RBC 3.18 (L) 3.87 - 5.11 MIL/uL   Hemoglobin 9.2 (L) 12.0 - 15.0 g/dL   HCT 71.5 (L) 63.9 - 53.9 %   MCV 89.3 80.0 - 100.0 fL   MCH 28.9 26.0 - 34.0 pg   MCHC 32.4 30.0 - 36.0 g/dL   RDW 84.5 88.4 - 84.4 %   Platelets 117 (L) 150 - 400 K/uL   nRBC 0.0 0.0 - 0.2 %    Comment: Performed at [facility], [address], [place], [place] [address]  Comprehensive metabolic panel     Status: Abnormal   Collection Time: 05/18/24  5:51 PM  Result Value Ref Range   Sodium 139 135 - 145 mmol/L   Potassium 3.7 3.5 - 5.1 mmol/L   Chloride 105 98 - 111 mmol/L   CO2 24 22 - 32 mmol/L   Glucose, Bld 88 70 -  99 mg/dL    Comment: Glucose reference range applies only to samples taken after fasting for at least 8 hours.   BUN 9 6 - 20 mg/dL   Creatinine, Ser 9.56 (L) 0.44 - 1.00 mg/dL   Calcium  8.2 (L) 8.9 - 10.3 mg/dL   Total Protein 5.9 (L) 6.5 - 8.1 g/dL   Albumin 2.7 (L) 3.5 - 5.0 g/dL   AST 66 (H) 15 - 41 U/L   ALT 56 (H) 0 - 44 U/L   Alkaline Phosphatase 78 38 - 126 U/L   Total Bilirubin 0.6 0.0 - 1.2 mg/dL   GFR, Estimated >39 >39 mL/min    Comment: (NOTE) Calculated using the CKD-EPI Creatinine Equation (2021)    Anion gap 10 5 - 15    Comment: Performed at [facility], [address] [place], [place] [address]  Lipase, blood     Status: None   Collection Time: 05/18/24  5:51 PM  Result Value Ref Range   Lipase 22 11 - 51  U/L    Comment: Performed at [facility], [address] [place], [place] [address]  CBC with Differential/Platelet     Status: Abnormal   Collection Time: 05/18/24  5:51 PM  Result Value Ref Range   WBC 1.0 (LL) 4.0 - 10.5 K/uL    Comment: This critical result has verified and been called to JACOB MOORE by Francina Ona on 07 21 2025 at 2035, and has been read back.    RBC 3.18 (L) 3.87 - 5.11 MIL/uL   Hemoglobin 9.5 (L) 12.0 - 15.0 g/dL   HCT 71.7 (L) 63.9 - 53.9 %   MCV 88.7 80.0 - 100.0 fL   MCH 29.9 26.0 - 34.0 pg   MCHC 33.7 30.0 - 36.0 g/dL   RDW 84.4 88.4 - 84.4 %   Platelets 123 (L) 150 - 400 K/uL   nRBC 0.0 0.0 - 0.2 %   Neutrophils Relative % 39 %   Neutro Abs 0.4 (LL) 1.7 - 7.7 K/uL    Comment: This critical result has verified and been called to JACOB MOORE by Francina Ona on 07 21 2025 at 2035, and has been read back.    Lymphocytes Relative 28 %   Lymphs Abs 0.3 (L) 0.7 - 4.0 K/uL   Monocytes Relative 29 %   Monocytes Absolute 0.3 0.1 - 1.0 K/uL   Eosinophils Relative 1 %   Eosinophils Absolute 0.0 0.0 - 0.5 K/uL   Basophils Relative 2 %   Basophils Absolute 0.0 0.0 - 0.1 K/uL   WBC Morphology See Note     Comment: NEUTROPENIA   RBC Morphology See Note    Smear Review Normal platelet morphology    Immature Granulocytes 1 %   Abs Immature Granulocytes 0.01 0.00 - 0.07 K/uL   Tear Drop Cells PRESENT    Ovalocytes PRESENT     Comment: Performed at [facility], [address] [place], [place] [address]  Lactate dehydrogenase     Status: None   Collection Time: 05/18/24 11:42 PM  Result Value Ref Range   LDH 127 98 - 192 U/L    Comment: Performed at [facility], [address] [place], [place] [address]  Protime-INR     Status: None   Collection Time: 05/18/24 11:42 PM  Result Value Ref Range   Prothrombin Time 14.1 11.4 - 15.2 seconds   INR 1.0 0.8 - 1.2    Comment: (NOTE) INR goal varies  based on device and disease  states. Performed at [facility], [address] [place], [place] [address]   APTT     Status: None   Collection Time: 05/18/24 11:42 PM  Result Value Ref Range   aPTT 31 24 - 36 seconds    Comment: Performed at [facility], [address] [place], [place] [address]  Fibrinogen      Status: Abnormal   Collection Time: 05/18/24 11:42 PM  Result Value Ref Range   Fibrinogen  476 (H) 210 - 475 mg/dL    Comment: (NOTE) Fibrinogen  results may be underestimated in patients receiving thrombolytic therapy. Performed at [facility], [address] [place], [place] [address]   Reticulocytes     Status: Abnormal   Collection Time: 05/18/24 11:42 PM  Result Value Ref Range   Retic Ct Pct 2.1 0.4 - 3.1 %   RBC. 2.98 (L) 3.87 - 5.11 MIL/uL   Retic Count, Absolute 62.0 19.0 - 186.0 K/uL   Immature Retic Fract 0.5 (L) 2.3 - 15.9 %    Comment: Performed at [facility], [address] [place], [place] [address]  Vitamin B12     Status: None   Collection Time: 05/18/24 11:42 PM  Result Value Ref Range   Vitamin B-12 187 180 - 914 pg/mL    Comment: (NOTE) This assay is not validated for testing neonatal or myeloproliferative syndrome specimens for Vitamin B12 levels. Performed at [facility], [address], [place], [place] [address]   Folate     Status: None   Collection Time: 05/18/24 11:42 PM  Result Value Ref Range   Folate 28.0 >5.9 ng/mL    Comment: Performed at [facility], [address] [place], [place] [address]  Brain natriuretic peptide     Status: None   Collection Time: 05/18/24 11:42 PM  Result Value Ref Range   B Natriuretic Peptide 22.6 0.0 - 100.0 pg/mL    Comment: Performed at [facility], [address] [place], [place] [address]  CBC with Differential/Platelet     Status: Abnormal   Collection Time: 05/19/24  4:32 AM  Result Value Ref Range   WBC 1.1 (LL) 4.0 - 10.5 K/uL    Comment:  CRITICAL VALUE NOTED.  VALUE IS CONSISTENT WITH PREVIOUSLY REPORTED AND CALLED VALUE.   RBC 3.12 (L) 3.87 - 5.11 MIL/uL   Hemoglobin 9.2 (L) 12.0 - 15.0 g/dL   HCT 72.3 (L) 63.9 - 53.9 %   MCV 88.5 80.0 - 100.0 fL   MCH 29.5 26.0 - 34.0 pg   MCHC 33.3 30.0 - 36.0 g/dL   RDW 84.4 88.4 - 84.4 %   Platelets 112 (L) 150 - 400 K/uL   nRBC 0.0 0.0 - 0.2 %   Neutrophils Relative % 56 %   Neutro Abs 0.6 (L) 1.7 - 7.7 K/uL   Lymphocytes Relative 23 %   Lymphs Abs 0.3 (L) 0.7 - 4.0 K/uL   Monocytes Relative 17 %   Monocytes Absolute 0.2 0.1 - 1.0 K/uL   Eosinophils Relative 1 %   Eosinophils Absolute 0.0 0.0 - 0.5 K/uL   Basophils Relative 2 %   Basophils Absolute 0.0 0.0 - 0.1 K/uL   WBC Morphology MORPHOLOGY UNREMARKABLE    Smear Review Normal platelet morphology    Immature Granulocytes 1 %   Abs Immature Granulocytes 0.01 0.00 - 0.07 K/uL   Ovalocytes PRESENT     Comment: Performed at [facility], [address] [place], [place] [address]  Basic metabolic panel     Status: Abnormal   Collection Time: 05/19/24  4:32 AM  Result Value Ref Range   Sodium 138 135 - 145 mmol/L   Potassium 3.5 3.5 - 5.1 mmol/L   Chloride 106 98 - 111 mmol/L   CO2 23 22 - 32 mmol/L   Glucose, Bld 102 (H) 70 - 99 mg/dL    Comment: Glucose reference range applies only to samples taken after fasting for at least 8 hours.   BUN 9 6 - 20 mg/dL   Creatinine, Ser 9.50 0.44 - 1.00 mg/dL   Calcium  8.0 (L) 8.9 - 10.3 mg/dL   GFR, Estimated >39 >39 mL/min    Comment: (NOTE) Calculated using the CKD-EPI Creatinine Equation (2021)    Anion gap 9 5 - 15    Comment: Performed at [facility], [address] [place], [place] [address]    US  MAINE Limited Result Date: 05/18/2024 CLINICAL DATA:  Fall.  Pelvic pain. EXAM: LIMITED OBSTETRIC ULTRASOUND COMPARISON:  None Available. FINDINGS: Evaluation is very limited due to body habitus. Number of Fetuses: 1 Heart Rate:  144 bpm Movement: Detected  Presentation: Cephalic Placental Location: Posterior Previa: No Amniotic Fluid (Subjective):  Within normal limits. BPD: 5.6 cm 23 w  0 d MATERNAL FINDINGS: Cervix:  Not visualized. Uterus/Adnexae: Right adnexal cyst measures up to 21 cm similar to prior ultrasound. This is suboptimally evaluated on this ultrasound. IMPRESSION: Single live intrauterine pregnancy.  No acute findings. This exam is performed on an emergent basis and does not comprehensively evaluate fetal size, dating, or anatomy; follow-up complete OB US  should be considered if further fetal assessment is warranted. Electronically Signed   By: Vanetta Chou M.D.   On: 05/18/2024 17:04   DG Knee Complete 4 Views Right Result Date: 05/18/2024 EXAM: 4 or more VIEW(S) XRAY OF THE RIGHT KNEE 05/18/2024 04:47:29 PM COMPARISON: None available. CLINICAL HISTORY: Fall onto right knee. Pt fell on right knee; shortness of breath. FINDINGS: BONES AND JOINTS: No acute fracture. No focal osseous lesion. No joint dislocation. No significant joint effusion. No significant degenerative changes. SOFT TISSUES: The soft tissues are unremarkable. Mild limitation secondary to patient body habitus. IMPRESSION: 1. No acute fracture or dislocation. Electronically signed by: Rockey Kilts MD 05/18/2024 04:55 PM EDT RP Workstation: [id number]   DG Chest Portable 1 View Result Date: 05/18/2024 CLINICAL DATA:  Fall, shortness of breath.  Pregnant patient. EXAM: PORTABLE CHEST 1 VIEW COMPARISON:  04/07/2016 FINDINGS: Lung volumes are low. Stable heart size and mediastinal contours. Bronchovascular crowding versus vascular congestion. Mild atelectasis at the lung bases. No pneumothorax or large pleural effusion. Anterior lordotic positioning. On limited assessment, no acute osseous findings IMPRESSION: Low lung volumes with bronchovascular crowding versus vascular congestion. Mild bibasilar atelectasis. Electronically Signed   By: Andrea Gasman M.D.   On: 05/18/2024 16:55     Current scheduled medications  prenatal multivitamin  1 tablet Oral Q1200    I have reviewed the patient's current medications.  ASSESSMENT: Principal Problem:   Pancytopenia (HCC) Active Problems:   Labor and delivery, indication for care   Fall at home, initial encounter   Asthma, chronic   PLAN: -Transfer to [facility] for further evaluation and care. -Patient has been accepted to [facility] service by Dr. Rendell. She is stable obstetrically. Awaiting transport, which will be arranged by RN.  Eleanor Canny, CNM [facility]

## 2024-05-19 NOTE — Consult Note (Signed)
 NAME: Bianca Hayes  DOB: 1990/11/22  MRN: [id number]  Date/Time: 05/19/2024 6:47 PM  REQUESTING PROVIDER: Dr.Brahmandy Subjective:  REASON FOR CONSULT: parasite in blood pancytopenia ? Bianca Hayes is a 33 y.o. female who is [redacted] weeks pregnant is admitted after a fall Pt was doing well until Sunday. On Monday she was tripped by her daughters dog and fell and hurt her left side. She went to work at [employer] after that as it was a new job. But she was feeling weak, with headaches and also some dizziness. She called her Ob at [facility] who asked her to go to the nearest ED and she came to [facility]   05/18/24 15:50  BP 144/85 !  Temp 98.5 F (36.9 C)  Pulse Rate 101 !  Resp 20  SpO2 100 %      Latest Reference Range & Units 05/18/24 17:51  WBC 4.0 - 10.5 K/uL 4.0 - 10.5 K/uL 1.0 (LL) [1] 1.1 (LL) [2]  Hemoglobin 12.0 - 15.0 g/dL 87.9 - 84.9 g/dL 9.5 (L) 9.2 (L)  HCT 63.9 - 46.0 % 36.0 - 46.0 % 28.2 (L) 28.4 (L)  Platelets 150 - 400 K/uL 150 - 400 K/uL 123 (L) 117 (L)  Creatinine 0.44 - 1.00 mg/dL 9.56 (L)   Xray rt knee was fine- CXR showed some basilar atelectasis Heme onc was consulted for pancytopenia- pt had seen her ob on 7/17 and cbc was near normal then Component Ref Range & Units 5 d ago  WBC 3.6 - 11.2 10*9/L 5.5  RBC 3.95 - 5.13 10*12/L 3.80 Low   HGB 11.3 - 14.9 g/dL 88.8 Low   HCT 65.9 - 55.9 % 33.0 Low   MCV 77.6 - 95.7 fL 86.8  MCH 25.9 - 32.4 pg 29.2  MCHC 32.0 - 36.0 g/dL 66.2  RDW 87.7 - 84.7 % 16.6 High   MPV 6.8 - 10.7 fL 8.2  Platelet 150 - 450 10*9/L 173   A peripheral smear examination by pathologist remarked 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  This could be due to a) medication (bactrim) b) AIHA with Anti-Jka-Ab c) G6PD and bactrim use. Given her anemia is hypoproliferative (abosluet reticulocyte count is 51.8), there is also component of myelosuppression which could be from parvo infection or bactrim. Ferritin, iron saturation ,Folate, B12 were normal on 8/27. Since bactrim, permethrin were stopped, T-Bil, LDH have been improving and she is clinically stable, we will monitor Hgb, LDH, indirect bilirubin.  All the workup came back okay She had skin biopsy and was thought to be eczema Pt does not remember this at all.  She says her dad has an immune deficient condition  She is currently on apsirin, prenatal vitamin In May 2025 she took metronidazole  for trich  In 2015 she was investigated for ovarian cyst teratoma with tumor markers and they were negative       Past Medical History:  Diagnosis Date   Anemia    Asthma  GERD (gastroesophageal reflux disease)    H/O cervical fracture    Ovarian cyst    Skull fracture (HCC)     Past Surgical History:  Procedure Laterality Date   BIOPSY THYROID      DENTAL SURGERY      Social History   Socioeconomic History   Marital status: Single    Spouse name: Not on file   Number of children: 3   Years of education: Not on file   Highest education level: Not on file  Occupational History    Employer: [employer]  Tobacco Use   Smoking status: Never   Smokeless tobacco: Never  Vaping Use   Vaping status: Never Used  Substance and Sexual Activity   Alcohol use: No   Drug use: No   Sexual activity: Yes    Partners: Male    Birth control/protection: None  Other Topics Concern   Not on file  Social History Narrative   Not on file   Social Drivers of Health   Financial Resource Strain: Medium Risk  (03/18/2024)   Overall Financial Resource Strain (CARDIA)    Difficulty of Paying Living Expenses: Somewhat hard  Food Insecurity: No Food Insecurity (05/19/2024)   Hunger Vital Sign    Worried About Running Out of Food in the Last Year: Never true    Ran Out of Food in the Last Year: Never true  Recent Concern: Food Insecurity - Food Insecurity Present (03/18/2024)   Hunger Vital Sign    Worried About Running Out of Food in the Last Year: Sometimes true    Ran Out of Food in the Last Year: Sometimes true  Transportation Needs: No Transportation Needs (05/19/2024)   PRAPARE - Administrator, Civil Service (Medical): No    Lack of Transportation (Non-Medical): No  Physical Activity: Insufficiently Active (03/18/2024)   Exercise Vital Sign    Days of Exercise per Week: 2 days    Minutes of Exercise per Session: 30 min  Stress: No Stress Concern Present (03/18/2024)   [employer] of Occupational Health - Occupational Stress Questionnaire    Feeling of Stress : Only a little  Social Connections: Moderately Isolated (03/18/2024)   Social Connection and Isolation Panel    Frequency of Communication with Friends and Family: More than three times a week    Frequency of Social Gatherings with Friends and Family: More than three times a week    Attends Religious Services: More than 4 times per year    Active Member of [employer] or Organizations: No    Attends Banker Meetings: Never    Marital Status: Divorced  Catering manager Violence: Not At Risk (05/19/2024)   Humiliation, Afraid, Rape, and Kick questionnaire    Fear of Current or Ex-Partner: No    Emotionally Abused: No    Physically Abused: No    Sexually Abused: No    Family History  Problem Relation Age of Onset   Thyroid  disease Mother    Hypertension Mother    Congestive Heart Failure Mother    COPD Mother    Hyperlipidemia Mother    CAD Mother    Stroke Mother    Heart failure Mother    Heart failure  Father    CAD Father    Hyperlipidemia Father    Hypertension Father    Immunodeficiency Father    Hypertension Maternal Grandmother    Diabetes Maternal Grandmother    Hypertension Maternal Grandfather    Diabetes Maternal Grandfather  Hypertension Paternal Grandmother    Diabetes Paternal Grandmother    Hypertension Paternal Grandfather    Diabetes Paternal Grandfather    Allergies  Allergen Reactions   Coconut Flavoring Agent (Non-Screening) Anaphylaxis   Iodine Hives and Shortness Of Breath   Bactrim [Sulfamethoxazole-Trimethoprim] Rash   Penicillins Rash   I? Current Facility-Administered Medications  Medication Dose Route Frequency Provider Last Rate Last Admin   0.9 %  sodium chloride  infusion   Intravenous Continuous Mansy, Jan A, MD 100 mL/hr at 05/19/24 0544 New Bag at 05/19/24 0544   acetaminophen  (TYLENOL ) tablet 650 mg  650 mg Oral Q6H PRN Mansy, Jan A, MD   650 mg at 05/19/24 0441   Or   acetaminophen  (TYLENOL ) suppository 650 mg  650 mg Rectal Q6H PRN Mansy, Jan A, MD       calcium  carbonate (TUMS - dosed in mg elemental calcium ) chewable tablet 200 mg of elemental calcium   1 tablet Oral TID PRN Sebastian Sham, CNM       ipratropium-albuterol  (DUONEB) 0.5-2.5 (3) MG/3ML nebulizer solution 3 mL  3 mL Nebulization QID PRN Mansy, Jan A, MD       magnesium  hydroxide (MILK OF MAGNESIA) suspension 30 mL  30 mL Oral Daily PRN Mansy, Jan A, MD       ondansetron  (ZOFRAN ) tablet 4 mg  4 mg Oral Q6H PRN Mansy, Jan A, MD       Or   ondansetron  (ZOFRAN ) injection 4 mg  4 mg Intravenous Q6H PRN Mansy, Jan A, MD       prenatal multivitamin tablet 1 tablet  1 tablet Oral Q1200 Mansy, Jan A, MD   1 tablet at 05/19/24 1154   zolpidem  (AMBIEN ) tablet 5 mg  5 mg Oral QHS PRN Sebastian Sham, CNM         Abtx:  Anti-infectives (From admission, onward)    None       REVIEW OF SYSTEMS:  Const: negative fever, negative chills, negative weight loss Eyes: negative diplopia or  visual changes, negative eye pain ENT: negative coryza, negative sore throat Resp: negative cough, hemoptysis, dyspnea Cards: negative for chest pain, palpitations, lower extremity edema GU: negative for frequency, dysuria and hematuria GI: Negative for abdominal pain, diarrhea, bleeding, constipation Skin: negative for rash and pruritus Heme: negative for easy bruising and gum/nose bleeding MS: negative for myalgias, arthralgias, back pain and muscle weakness Neurolo:negative for headaches, dizziness, vertigo, memory problems  Psych: negative for feelings of anxiety, depression  Endocrine: negative for thyroid , diabetes Allergy/Immunology- as above Objective:  VITALS:  BP 124/71 (BP Location: Left Arm)   Pulse 98   Temp 98.2 F (36.8 C) (Oral)   Resp 20   Ht 5' 9 (1.753 m)   Wt (!) 171.8 kg   LMP 12/14/2023   SpO2 100%   BMI 55.92 kg/m   PHYSICAL EXAM:  General: Alert, cooperative, no distress, appears stated age. Pale, increased BMI Head: Normocephalic, without obvious abnormality, atraumatic. Eyes: Conjunctivae clear, anicteric sclerae. Pupils are equal ENT Nares normal. No drainage or sinus tenderness. Lips, mucosa, and tongue normal. No Thrush Very poor dentition- broken teeth Neck: Supple, symmetrical, no adenopathy, thyroid : non tender no carotid bruit and no JVD. Back: No CVA tenderness. Lungs: Clear to auscultation bilaterally. No Wheezing or Rhonchi. No rales. Heart: Regular rate and rhythm, no murmur, rub or gallop. Abdomen: Soft, non-tender,not distended. Bowel sounds normal. No masses Extremities: left calf area- scratch mark- slightly inflamed atraumatic, no cyanosis. No edema. No clubbing  Skin: No rashes or lesions. Or bruising Lymph: Cervical, supraclavicular normal. Neurologic: Grossly non-focal Pertinent Labs Lab Results CBC    Component Value Date/Time   WBC 1.1 (LL) 05/19/2024 0432   RBC 3.12 (L) 05/19/2024 0432   HGB 9.2 (L) 05/19/2024 0432    HGB 11.3 03/25/2024 1104   HCT 27.6 (L) 05/19/2024 0432   HCT 33.8 (L) 03/25/2024 1104   PLT 112 (L) 05/19/2024 0432   PLT 172 03/25/2024 1104   MCV 88.5 05/19/2024 0432   MCV 88 03/25/2024 1104   MCV 89 09/03/2014 2302   MCH 29.5 05/19/2024 0432   MCHC 33.3 05/19/2024 0432   RDW 15.5 05/19/2024 0432   RDW 15.0 03/25/2024 1104   RDW 13.7 09/03/2014 2302   LYMPHSABS 0.3 (L) 05/19/2024 0432   LYMPHSABS 0.9 03/25/2024 1104   LYMPHSABS 1.2 08/27/2012 1041   MONOABS 0.2 05/19/2024 0432   MONOABS 0.4 08/27/2012 1041   EOSABS 0.0 05/19/2024 0432   EOSABS 0.1 03/25/2024 1104   EOSABS 0.2 08/27/2012 1041   BASOSABS 0.0 05/19/2024 0432   BASOSABS 0.0 03/25/2024 1104   BASOSABS 0.1 08/27/2012 1041       Latest Ref Rng & Units 05/19/2024    4:32 AM 05/18/2024    5:51 PM 03/26/2024    1:35 PM  CMP  Glucose 70 - 99 mg/dL 897  88  886   BUN 6 - 20 mg/dL 9  9  9    Creatinine 0.44 - 1.00 mg/dL 9.50  9.56  9.36   Sodium 135 - 145 mmol/L 138  139  136   Potassium 3.5 - 5.1 mmol/L 3.5  3.7  3.8   Chloride 98 - 111 mmol/L 106  105  105   CO2 22 - 32 mmol/L 23  24  23    Calcium  8.9 - 10.3 mg/dL 8.0  8.2  8.2   Total Protein 6.5 - 8.1 g/dL  5.9  6.0   Total Bilirubin 0.0 - 1.2 mg/dL  0.6  0.7   Alkaline Phos 38 - 126 U/L  78  67   AST 15 - 41 U/L  66  18   ALT 0 - 44 U/L  56  15       Microbiology: No results found for this or any previous visit (from the past 240 hours).  IMAGING RESULTS: CXR - basilar atelectasis I have personally reviewed the films ? Impression/Recommendation Pt presenting after a fall with weakness and incidentally found to have pancytopenia. Clinically stable, no fever  Normal labs ( except anemia) 4 days before  Pancytopenia Leucopenia significant Thrombocytopenia Anemia Similar presentation in 2014 but was accompanied by hemolytic anemia and thought to be due to bactrim??  Transaminitis Normal LDH Normal Bilirubin B12 187 ( range 180-900)  D.D for  pancytopenia and mild transaminitis  Peripheral smear questioned  RBC inclusion  ? 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  Lupus?   Pernicious anemia?  Aplastic anemia?  Need to weigh the pros and cons of treating for babesia which is a tick borne illness causes by ixodes scapularis bite which also transmits lyme and ehrlichia I reviewed the peripheral smear with pathologist- just  one rbc with an intracellular inclusion  -  Babesia is not common in [place] and she has lived in [place]  when she was 17. 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

## 2024-05-19 NOTE — TOC CM/SW Note (Signed)
 Transition of Care [facility]) - Inpatient Brief Assessment   Patient Details  Name: Bianca Hayes MRN: [id number] Date of Birth: [birth year]  Transition of Care [facility]) CM/SW Contact:    Asberry CHRISTELLA Jaksch, RN Phone Number: 05/19/2024, 11:48 AM   Clinical Narrative:  Transition of Care [facility]) Screening Note   Patient Details  Name: Bianca Hayes Date of Birth: 05/05/1991   Transition of Care [facility]) CM/SW Contact:    Asberry CHRISTELLA Jaksch, RN Phone Number: 05/19/2024, 11:48 AM    Transition of Care Department [facility]) has reviewed patient and no TOC needs have been identified at this time. If new patient transition needs arise, please place a TOC consult.  Per SDOH, resources for food and housing attached to AVS.      Transition of Care Asessment: Insurance and Status: Insurance coverage has been reviewed Patient has primary care physician: Yes (No PCP on file. Per Medicaid card  [facility] is [facility],)     Prior/Current Home Services: No current home services Social Drivers of Health Review: SDOH reviewed interventions complete Readmission risk has been reviewed: Yes Transition of care needs: no transition of care needs at this time

## 2024-05-19 NOTE — Progress Notes (Signed)
 Transfer accepted by Dr. Kucirka at [facility].  Bianca Hayes, CNM

## 2024-05-19 NOTE — Plan of Care (Signed)

## 2024-05-19 NOTE — Evaluation (Signed)
 Physical Therapy Evaluation Patient Details Name: Bianca Hayes MRN: [id number] DOB: Sep 12, 1991 Today's Date: 05/19/2024  History of Present Illness  Bianca Hayes is a 33 y.o. obese Caucasian female with medical history significant for 23-week pregnancy, asthma, GERD, anemia, gestational diabetes mellitus and pregnancy-induced hypertension, who presented to the emergency room with acute onset of mechanical fall tripping over her dog.  Clinical Impression  Pt is a pleasant 33 year old female who was admitted for pancytopenia. Pt performs bed mobility with mod I, needing increased time to complete transition. STS transfer performed with mod I, no AD use or LOB. Ambulation successful x28' with no AD, pt verbalizing she is at her baseline level of function. No skilled PT needs at this time, consult if there is a change in status.          If plan is discharge home, recommend the following: A little help with walking and/or transfers;Assist for transportation;Help with stairs or ramp for entrance   Can travel by private vehicle        Equipment Recommendations None recommended by PT  Recommendations for Other Services       Functional Status Assessment Patient has not had a recent decline in their functional status     Precautions / Restrictions Precautions Precautions: Fall Recall of Precautions/Restrictions: Intact Restrictions Weight Bearing Restrictions Per Provider Order: No      Mobility  Bed Mobility Overal bed mobility: Modified Independent             General bed mobility comments: increased time to complete transition    Transfers Overall transfer level: Needs assistance Equipment used: None Transfers: Sit to/from Stand Sit to Stand: Modified independent (Device/Increase time)           General transfer comment: mod i, increased time, no AD, no LOB    Ambulation/Gait   Gait Distance (Feet): 140 Feet Assistive device: None Gait  Pattern/deviations: Step-through pattern, Decreased step length - right, Decreased step length - left       General Gait Details: no AD use, no LOB, decreased step length and width, pt stating she felt this was her baseline function  Stairs            Wheelchair Mobility     Tilt Bed    Modified Rankin (Stroke Patients Only)       Balance Overall balance assessment: Needs assistance Sitting-balance support: No upper extremity supported, Feet supported Sitting balance-Leahy Scale: Good Sitting balance - Comments: sitting EOB   Standing balance support: No upper extremity supported Standing balance-Leahy Scale: Good Standing balance comment: standing with no AD                             Pertinent Vitals/Pain Pain Assessment Pain Assessment: No/denies pain    Home Living Family/patient expects to be discharged to:: Private residence Living Arrangements: Children Available Help at Discharge: Family Type of Home: Apartment Home Access: Stairs to enter Entrance Stairs-Rails: None Secretary/administrator of Steps: 3   Home Layout: One level Home Equipment: None      Prior Function Prior Level of Function : Independent/Modified Independent             Mobility Comments: IND ADLs Comments: IND     Extremity/Trunk Assessment   Upper Extremity Assessment Upper Extremity Assessment: Overall WFL for tasks assessed    Lower Extremity Assessment Lower Extremity Assessment: Generalized weakness    Cervical /  Trunk Assessment Cervical / Trunk Assessment: Normal  Communication   Communication Communication: No apparent difficulties    Cognition Arousal: Alert Behavior During Therapy: WFL for tasks assessed/performed   PT - Cognitive impairments: No apparent impairments                         Following commands: Intact       Cueing Cueing Techniques: Verbal cues, Tactile cues     General Comments      Exercises      Assessment/Plan    PT Assessment Patient does not need any further PT services  PT Problem List         PT Treatment Interventions      PT Goals (Current goals can be found in the Care Plan section)  Acute Rehab PT Goals Patient Stated Goal: to return home PT Goal Formulation: With patient Time For Goal Achievement: 06/02/24 Potential to Achieve Goals: Good    Frequency       Co-evaluation               AM-PAC PT 6 Clicks Mobility  Outcome Measure Help needed turning from your back to your side while in a flat bed without using bedrails?: None Help needed moving from lying on your back to sitting on the side of a flat bed without using bedrails?: None Help needed moving to and from a bed to a chair (including a wheelchair)?: A Little Help needed standing up from a chair using your arms (e.g., wheelchair or bedside chair)?: A Little Help needed to walk in hospital room?: A Little Help needed climbing 3-5 steps with a railing? : A Little 6 Click Score: 20    End of Session   Activity Tolerance: Patient tolerated treatment well Patient left: in chair;with call bell/phone within reach;with chair alarm set Nurse Communication: Mobility status PT Visit Diagnosis: Unsteadiness on feet (R26.81);History of falling (Z91.81)    Time: 9080-9064 PT Time Calculation (min) (ACUTE ONLY): 16 min   Charges:                Sai Zinn Romero-Perozo, SPT  05/19/2024, 9:53 AM

## 2024-05-19 NOTE — Assessment & Plan Note (Signed)
-   This is essentially a mechanical fall. - She has subsequent right knee contusion and pain. - Pain management will be provided. - PT evaluation will be obtained.

## 2024-05-19 NOTE — Plan of Care (Addendum)
 2257 - Pt sent with transport via stretcher and all belongings.     Problem: Pain Managment: Goal: General experience of comfort will improve and/or be controlled Outcome: Progressing

## 2024-05-19 NOTE — Consult Note (Signed)
 [facility] CONSULT NOTE  Patient Care Team: Pcp, No as PCP - General  CHIEF COMPLAINTS/PURPOSE OF CONSULTATION: pancytopenia  Oncology History   No history exists.    HISTORY OF PRESENTING ILLNESS: Patient ambulating-independently/with assistance.  Alone/Accompanied by family.   Bianca Hayes 33 y.o.  female pleasant patient with obesity-currently pregnant 6 months has been referred to us  for further evaluation recommendations for pancytopenia.  Patient states to have incidental mechanical fall at home when she tripped over a dog.  Patient was further evaluated in the emergency room-incidentally noted to have severe pancytopenia with white count of 1.2; hemoglobin on 9 platelets 120.   Hematology was consulted for further evaluation recommendations.   Patient admits to mild fatigue otherwise denies any worsening cough or shortness of the chest pain.  She admits to mild nausea otherwise no fevers or chills.  Denies any tick bites.  No new medications.  She denies any bleeding.  She appears nontoxic.  Of note patient follows up with [facility] for her high risk obstetric care.  Of note she had a CBC done last week-that showed mild anemia of around 10 hemoglobin otherwise fairly unremarkable white count and platelets.   Review of Systems  Constitutional:  Positive for diaphoresis and malaise/fatigue. Negative for chills, fever and weight loss.  HENT:  Negative for nosebleeds and sore throat.   Eyes:  Negative for double vision.  Respiratory:  Negative for cough, hemoptysis, sputum production, shortness of breath and wheezing.   Cardiovascular:  Negative for chest pain, palpitations, orthopnea and leg swelling.  Gastrointestinal:  Positive for nausea. Negative for abdominal pain, blood in stool, constipation, diarrhea, heartburn, melena and vomiting.  Genitourinary:  Negative for dysuria, frequency and urgency.  Musculoskeletal:  Negative for back pain and joint pain.   Skin: Negative.  Negative for itching and rash.  Neurological:  Negative for dizziness, tingling, focal weakness, weakness and headaches.  Endo/Heme/Allergies:  Does not bruise/bleed easily.  Psychiatric/Behavioral:  Negative for depression. The patient is not nervous/anxious and does not have insomnia.     MEDICAL HISTORY:  Past Medical History:  Diagnosis Date   Anemia    Asthma    GERD (gastroesophageal reflux disease)    H/O cervical fracture    Ovarian cyst    Skull fracture (HCC)     SURGICAL HISTORY: Past Surgical History:  Procedure Laterality Date   BIOPSY THYROID      DENTAL SURGERY      SOCIAL HISTORY: Social History   Socioeconomic History   Marital status: Single    Spouse name: Not on file   Number of children: 3   Years of education: Not on file   Highest education level: Not on file  Occupational History    Employer: [employer]  Tobacco Use   Smoking status: Never   Smokeless tobacco: Never  Vaping Use   Vaping status: Never Used  Substance and Sexual Activity   Alcohol use: No   Drug use: No   Sexual activity: Yes    Partners: Male    Birth control/protection: None  Other Topics Concern   Not on file  Social History Narrative   Not on file   Social Drivers of Health   Financial Resource Strain: Medium Risk (03/18/2024)   Overall Financial Resource Strain (CARDIA)    Difficulty of Paying Living Expenses: Somewhat hard  Food Insecurity: No Food Insecurity (05/19/2024)   Hunger Vital Sign    Worried About Running Out of Food in  the Last Year: Never true    Ran Out of Food in the Last Year: Never true  Recent Concern: Food Insecurity - Food Insecurity Present (03/18/2024)   Hunger Vital Sign    Worried About Running Out of Food in the Last Year: Sometimes true    Ran Out of Food in the Last Year: Sometimes true  Transportation Needs: No Transportation Needs (05/19/2024)   PRAPARE - Administrator, Civil Service (Medical): No     Lack of Transportation (Non-Medical): No  Physical Activity: Insufficiently Active (03/18/2024)   Exercise Vital Sign    Days of Exercise per Week: 2 days    Minutes of Exercise per Session: 30 min  Stress: No Stress Concern Present (03/18/2024)   [employer] of Occupational Health - Occupational Stress Questionnaire    Feeling of Stress : Only a little  Social Connections: Moderately Isolated (03/18/2024)   Social Connection and Isolation Panel    Frequency of Communication with Friends and Family: More than three times a week    Frequency of Social Gatherings with Friends and Family: More than three times a week    Attends Religious Services: More than 4 times per year    Active Member of [employer] or Organizations: No    Attends Banker Meetings: Never    Marital Status: Divorced  Catering manager Violence: Not At Risk (05/19/2024)   Humiliation, Afraid, Rape, and Kick questionnaire    Fear of Current or Ex-Partner: No    Emotionally Abused: No    Physically Abused: No    Sexually Abused: No    FAMILY HISTORY: Family History  Problem Relation Age of Onset   Thyroid  disease Mother    Hypertension Mother    Congestive Heart Failure Mother    COPD Mother    Hyperlipidemia Mother    CAD Mother    Stroke Mother    Heart failure Mother    Heart failure Father    CAD Father    Hyperlipidemia Father    Hypertension Father    Immunodeficiency Father    Hypertension Maternal Grandmother    Diabetes Maternal Grandmother    Hypertension Maternal Grandfather    Diabetes Maternal Grandfather    Hypertension Paternal Grandmother    Diabetes Paternal Grandmother    Hypertension Paternal Grandfather    Diabetes Paternal Grandfather     ALLERGIES:  is allergic to coconut flavoring agent (non-screening), iodine, bactrim [sulfamethoxazole-trimethoprim], and penicillins.  MEDICATIONS:  Current Facility-Administered Medications  Medication Dose Route Frequency Provider  Last Rate Last Admin   0.9 %  sodium chloride  infusion   Intravenous Continuous Mansy, Jan A, MD 100 mL/hr at 05/19/24 0544 New Bag at 05/19/24 0544   acetaminophen  (TYLENOL ) tablet 650 mg  650 mg Oral Q6H PRN Mansy, Jan A, MD   650 mg at 05/19/24 0441   Or   acetaminophen  (TYLENOL ) suppository 650 mg  650 mg Rectal Q6H PRN Mansy, Madison LABOR, MD       calcium  carbonate (TUMS - dosed in mg elemental calcium ) chewable tablet 200 mg of elemental calcium   1 tablet Oral TID PRN Sebastian Sham, CNM       ipratropium-albuterol  (DUONEB) 0.5-2.5 (3) MG/3ML nebulizer solution 3 mL  3 mL Nebulization QID PRN Mansy, Jan A, MD       magnesium  hydroxide (MILK OF MAGNESIA) suspension 30 mL  30 mL Oral Daily PRN Mansy, Madison LABOR, MD       ondansetron  (ZOFRAN ) tablet 4  mg  4 mg Oral Q6H PRN Mansy, Jan A, MD       Or   ondansetron  (ZOFRAN ) injection 4 mg  4 mg Intravenous Q6H PRN Mansy, Jan A, MD       prenatal multivitamin tablet 1 tablet  1 tablet Oral Q1200 Mansy, Jan A, MD   1 tablet at 05/19/24 1154   zolpidem  (AMBIEN ) tablet 5 mg  5 mg Oral QHS PRN Sebastian Sham, CNM        PHYSICAL EXAMINATION:   Vitals:   05/19/24 0425 05/19/24 0726  BP: (!) 121/57 109/69  Pulse: 100 88  Resp:  (!) 22  Temp: 98.7 F (37.1 C) 98.1 F (36.7 C)  SpO2: 98% 100%   Filed Weights   05/18/24 1552  Weight: (!) 387 lb (175.5 kg)    Physical Exam Vitals and nursing note reviewed.  HENT:     Head: Normocephalic and atraumatic.     Mouth/Throat:     Pharynx: Oropharynx is clear.  Eyes:     Extraocular Movements: Extraocular movements intact.     Pupils: Pupils are equal, round, and reactive to light.  Cardiovascular:     Rate and Rhythm: Normal rate and regular rhythm.  Pulmonary:     Comments: Decreased breath sounds bilaterally.  Abdominal:     Palpations: Abdomen is soft.  Musculoskeletal:        General: Normal range of motion.     Cervical back: Normal range of motion.  Skin:    General: Skin is warm.   Neurological:     General: No focal deficit present.     Mental Status: She is alert and oriented to person, place, and time.  Psychiatric:        Behavior: Behavior normal.        Judgment: Judgment normal.     LABORATORY DATA:  I have reviewed the data as listed Lab Results  Component Value Date   WBC 1.1 (LL) 05/19/2024   HGB 9.2 (L) 05/19/2024   HCT 27.6 (L) 05/19/2024   MCV 88.5 05/19/2024   PLT 112 (L) 05/19/2024   Recent Labs    03/25/24 1104 03/26/24 1335 05/18/24 1751 05/19/24 0432  NA 137 136 139 138  K 3.9 3.8 3.7 3.5  CL 101 105 105 106  CO2 20 23 24 23   GLUCOSE 96 113* 88 102*  BUN 9 9 9 9   CREATININE 0.56* 0.63 0.43* 0.49  CALCIUM  8.5* 8.2* 8.2* 8.0*  GFRNONAA  --  >60 >60 >60  PROT 5.7* 6.0* 5.9*  --   ALBUMIN 3.5* 3.0* 2.7*  --   AST 13 18 66*  --   ALT 15 15 56*  --   ALKPHOS 95 67 78  --   BILITOT 0.5 0.7 0.6  --     RADIOGRAPHIC STUDIES: I have personally reviewed the radiological images as listed and agreed with the findings in the report. US  OB Limited Result Date: 05/18/2024 CLINICAL DATA:  Fall.  Pelvic pain. EXAM: LIMITED OBSTETRIC ULTRASOUND COMPARISON:  None Available. FINDINGS: Evaluation is very limited due to body habitus. Number of Fetuses: 1 Heart Rate:  144 bpm Movement: Detected Presentation: Cephalic Placental Location: Posterior Previa: No Amniotic Fluid (Subjective):  Within normal limits. BPD: 5.6 cm 23 w  0 d MATERNAL FINDINGS: Cervix:  Not visualized. Uterus/Adnexae: Right adnexal cyst measures up to 21 cm similar to prior ultrasound. This is suboptimally evaluated on this ultrasound. IMPRESSION: Single live intrauterine pregnancy.  No  acute findings. This exam is performed on an emergent basis and does not comprehensively evaluate fetal size, dating, or anatomy; follow-up complete OB US  should be considered if further fetal assessment is warranted. Electronically Signed   By: Vanetta Chou M.D.   On: 05/18/2024 17:04   DG  Knee Complete 4 Views Right Result Date: 05/18/2024 EXAM: 4 or more VIEW(S) XRAY OF THE RIGHT KNEE 05/18/2024 04:47:29 PM COMPARISON: None available. CLINICAL HISTORY: Fall onto right knee. Pt fell on right knee; shortness of breath. FINDINGS: BONES AND JOINTS: No acute fracture. No focal osseous lesion. No joint dislocation. No significant joint effusion. No significant degenerative changes. SOFT TISSUES: The soft tissues are unremarkable. Mild limitation secondary to patient body habitus. IMPRESSION: 1. No acute fracture or dislocation. Electronically signed by: Rockey Kilts MD 05/18/2024 04:55 PM EDT RP Workstation: [id number]   DG Chest Portable 1 View Result Date: 05/18/2024 CLINICAL DATA:  Fall, shortness of breath.  Pregnant patient. EXAM: PORTABLE CHEST 1 VIEW COMPARISON:  04/07/2016 FINDINGS: Lung volumes are low. Stable heart size and mediastinal contours. Bronchovascular crowding versus vascular congestion. Mild atelectasis at the lung bases. No pneumothorax or large pleural effusion. Anterior lordotic positioning. On limited assessment, no acute osseous findings IMPRESSION: Low lung volumes with bronchovascular crowding versus vascular congestion. Mild bibasilar atelectasis. Electronically Signed   By: Andrea Gasman M.D.   On: 05/18/2024 52:23   # 33 year old female patient currently 6 months pregnant admitted to hospital for severe neutropenia/pancytopenia  # Severe neutropenia/pancytopenia-the etiology is unclear.  However PT PTT normal LDH normal-not clinically suspicious of acute leukemia.  No evidence of DIC.  No obvious peripheral folic acid deficiency.  Question aplastic anemia versus others. However, peripheral smear suggestive of intracellular parasite-question babesiosis versus others  Infectious workup including recent hepatitis panel/HIV negative.   # High risk pregnancy-followed by [facility].  # Recommendations/plan:  # Discussed with patient regarding proceeding with a  bone marrow biopsy-for further evaluation given high concerns of aplastic anemia or from infectious conditions. Will check for babesiosis/tick borne illness.   Patient will also need ultrasound of abdomen rule out hepatosplenomegaly.  # I discussed with primary team Dr. Janit regarding plan for transfer to [facility] given the logistics of the care involved; and also patient is followed by [facility] high risk OB at this time.  I reached out to hematology at [facility]- Dr.Dittus for heads up regarding the transfer of care.  I am currently awaiting to hear from hematology at [facility].  Patient has been currently been accepted by [facility]-currently waiting for a bed.  I updated the patient of the above plan of care.  Above plan of care was discussed with patient/family in detail.  My contact information was given to the patient.       Cindy JONELLE Joe, MD 05/19/2024 2:04 PM

## 2024-05-19 NOTE — Assessment & Plan Note (Addendum)
-   She will be placed on as needed DuoNeb.

## 2024-05-19 NOTE — Progress Notes (Signed)
 Per [facility], pt authorization for transport must be approved via [employer] Corporate investment banker).  This RN spoke with [employer] and dispatch who is needing to ensure that pt is being transported to a higher level of care in order for insurance to pay.  Message and contact person provided via secure message for physician.

## 2024-05-19 NOTE — Progress Notes (Signed)
 Preparing for transfer to [facility]. Bianca Hayes is alert and in no acute distresss. She denies any acute obstetric concerns.  BP 134/89   Pulse (!) 104   Temp 98.5 F (36.9 C) (Oral)   Resp (!) 24   Ht 5' 9 (1.753 m)   Wt (!) 171.8 kg   LMP 12/14/2023   SpO2 100%   BMI 55.92 kg/m   [name], CNM

## 2024-05-19 NOTE — Plan of Care (Signed)

## 2024-05-20 ENCOUNTER — Encounter: Admitting: Obstetrics

## 2024-05-20 DIAGNOSIS — E66813 Obesity, class 3: Secondary | ICD-10-CM | POA: Diagnosis not present

## 2024-05-20 DIAGNOSIS — N839 Noninflammatory disorder of ovary, fallopian tube and broad ligament, unspecified: Secondary | ICD-10-CM | POA: Diagnosis not present

## 2024-05-20 DIAGNOSIS — E042 Nontoxic multinodular goiter: Secondary | ICD-10-CM | POA: Diagnosis not present

## 2024-05-20 DIAGNOSIS — O99012 Anemia complicating pregnancy, second trimester: Secondary | ICD-10-CM | POA: Diagnosis not present

## 2024-05-20 DIAGNOSIS — R590 Localized enlarged lymph nodes: Secondary | ICD-10-CM | POA: Diagnosis not present

## 2024-05-20 DIAGNOSIS — Z3A22 22 weeks gestation of pregnancy: Secondary | ICD-10-CM | POA: Diagnosis not present

## 2024-05-20 DIAGNOSIS — O10012 Pre-existing essential hypertension complicating pregnancy, second trimester: Secondary | ICD-10-CM | POA: Diagnosis not present

## 2024-05-20 DIAGNOSIS — O132 Gestational [pregnancy-induced] hypertension without significant proteinuria, second trimester: Secondary | ICD-10-CM | POA: Diagnosis not present

## 2024-05-20 DIAGNOSIS — D61818 Other pancytopenia: Secondary | ICD-10-CM | POA: Diagnosis not present

## 2024-05-20 DIAGNOSIS — O99212 Obesity complicating pregnancy, second trimester: Secondary | ICD-10-CM | POA: Diagnosis not present

## 2024-05-20 DIAGNOSIS — E639 Nutritional deficiency, unspecified: Secondary | ICD-10-CM | POA: Diagnosis not present

## 2024-05-20 DIAGNOSIS — O99712 Diseases of the skin and subcutaneous tissue complicating pregnancy, second trimester: Secondary | ICD-10-CM | POA: Diagnosis not present

## 2024-05-20 DIAGNOSIS — O99891 Other specified diseases and conditions complicating pregnancy: Secondary | ICD-10-CM | POA: Diagnosis not present

## 2024-05-20 DIAGNOSIS — O3482 Maternal care for other abnormalities of pelvic organs, second trimester: Secondary | ICD-10-CM | POA: Diagnosis not present

## 2024-05-20 DIAGNOSIS — N838 Other noninflammatory disorders of ovary, fallopian tube and broad ligament: Secondary | ICD-10-CM | POA: Diagnosis not present

## 2024-05-20 DIAGNOSIS — O99892 Other specified diseases and conditions complicating childbirth: Secondary | ICD-10-CM | POA: Diagnosis not present

## 2024-05-20 DIAGNOSIS — J45909 Unspecified asthma, uncomplicated: Secondary | ICD-10-CM | POA: Diagnosis not present

## 2024-05-20 DIAGNOSIS — O99512 Diseases of the respiratory system complicating pregnancy, second trimester: Secondary | ICD-10-CM | POA: Diagnosis not present

## 2024-05-20 LAB — HAPTOGLOBIN: Haptoglobin: 122 mg/dL (ref 33–278)

## 2024-05-20 LAB — LYME DISEASE SEROLOGY W/REFLEX: Lyme Total Antibody EIA: NEGATIVE

## 2024-05-20 NOTE — Care Plan (Signed)
 Patient arrived from outside hospital at 0010. Patient oriented to unit. POC initiated with patient. VSS (1 elevated BP at time of admission, but patient was ambulating around the room and talking, recheck WDL). Free from falls and injury. Voiding without difficulty. Pain well managed on current regimen (Tylenol  once for HA). Scabbed over scratch present on posterior LLE. Questions answered and needs met. POC ongoing.   Problem: Adult Inpatient Plan of Care Goal: Plan of Care Review Outcome: Progressing Goal: Patient-Specific Goal (Individualized) Outcome: Progressing Goal: Absence of Hospital-Acquired Illness or Injury Outcome: Progressing Intervention: Identify and Manage Fall Risk Recent Flowsheet Documentation Taken 05/20/2024 0039 by Janeane Merle, RN Safety Interventions: . fall reduction program maintained . lighting adjusted for tasks/safety . low bed . nonskid shoes/slippers when out of bed Intervention: Prevent and Manage VTE (Venous Thromboembolism) Risk Recent Flowsheet Documentation Taken 05/20/2024 0500 by Janeane Merle, RN Anti-Embolism Device Status: Refused Taken 05/20/2024 0300 by Janeane Merle, RN Anti-Embolism Device Status: Refused Taken 05/20/2024 0100 by Janeane Merle, RN Anti-Embolism Device Status: Refused Taken 05/20/2024 0039 by Janeane Merle, RN Anti-Embolism Device Status: Refused Goal: Optimal Comfort and Wellbeing Outcome: Progressing Goal: Readiness for Transition of Care Outcome: Progressing Goal: Rounds/Family Conference Outcome: Progressing   Problem: Pain Acute Goal: Optimal Pain Control and Function Outcome: Progressing
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
## 2024-05-20 NOTE — Discharge Summary (Signed)
 Physician Discharge Summary  Patient ID: AVRIANA JOO MRN: [id number] DOB/AGE: 1991/04/13 33 y.o.  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 She was cleared obstetrically but found to have pancytopenia. She was admitted to a medical unit. Recommendation was made by hospitalist team to transfer her to [facility] for a further workup including possible bone marrow biopsy. Transfer was coordinated with the OB team at [facility].  Consults: ID, hematology/oncology, and hospitalist  Significant Diagnostic Studies: labs: CBC    Component Value Date/Time   WBC 1.1 (LL) 05/19/2024 0432   RBC 3.12 (L) 05/19/2024 0432   HGB 9.2 (L) 05/19/2024 0432   HGB 11.3 03/25/2024 1104   HCT 27.6 (L) 05/19/2024 0432   HCT 33.8 (L) 03/25/2024 1104   PLT 112 (L) 05/19/2024 0432   PLT 172 03/25/2024 1104   MCV 88.5 05/19/2024 0432   MCV 88 03/25/2024 1104   MCV 89 09/03/2014 2302   MCH 29.5 05/19/2024 0432   MCHC 33.3 05/19/2024 0432   RDW 15.5 05/19/2024 0432   RDW 15.0 03/25/2024 1104   RDW 13.7 09/03/2014 2302   LYMPHSABS 0.3 (L) 05/19/2024 0432   LYMPHSABS 0.9 03/25/2024 1104   LYMPHSABS 1.2 08/27/2012 1041   MONOABS 0.2 05/19/2024 0432   MONOABS 0.4 08/27/2012 1041   EOSABS 0.0 05/19/2024 0432   EOSABS 0.1 03/25/2024 1104   EOSABS 0.2 08/27/2012 1041   BASOSABS 0.0 05/19/2024 0432   BASOSABS 0.0 03/25/2024 1104   BASOSABS 0.1 08/27/2012 1041     Treatments: IV hydration  Discharge Exam: Blood pressure 134/89, pulse (!) 104, temperature 98.5 F (36.9 C), temperature source Oral, resp. rate (!) 24, height 5' 9 (1.753 m), weight (!) 171.8 kg, last  menstrual period 12/14/2023, SpO2 100%. General appearance: alert and cooperative Resp: no increased WOB Cardio: regular rate Abdomen: soft, gravid, non-tender, no ctx  Disposition: Discharge disposition: 95-DC/txfr to another health care institution with planned acute care hosp IP readmit        Allergies as of 05/20/2024       Reactions   Coconut Flavoring Agent (non-screening) Anaphylaxis   Iodine Hives, Shortness Of Breath   Bactrim [sulfamethoxazole-trimethoprim] Rash   Penicillins Rash        Medication List     TAKE these medications    aspirin  EC 81 MG tablet Take 2 tablets (162 mg total) by mouth daily. Swallow whole. Start at [redacted] weeks pregnant   PRENATAL PO Take by mouth daily.         Signed: [name] 05/20/2024, 7:27 AM

## 2024-05-21 DIAGNOSIS — R718 Other abnormality of red blood cells: Secondary | ICD-10-CM | POA: Diagnosis not present

## 2024-05-21 DIAGNOSIS — O99891 Other specified diseases and conditions complicating pregnancy: Secondary | ICD-10-CM | POA: Diagnosis not present

## 2024-05-21 DIAGNOSIS — O99512 Diseases of the respiratory system complicating pregnancy, second trimester: Secondary | ICD-10-CM | POA: Diagnosis not present

## 2024-05-21 DIAGNOSIS — D72829 Elevated white blood cell count, unspecified: Secondary | ICD-10-CM | POA: Diagnosis not present

## 2024-05-21 DIAGNOSIS — R7401 Elevation of levels of liver transaminase levels: Secondary | ICD-10-CM | POA: Diagnosis not present

## 2024-05-21 DIAGNOSIS — O10012 Pre-existing essential hypertension complicating pregnancy, second trimester: Secondary | ICD-10-CM | POA: Diagnosis not present

## 2024-05-21 DIAGNOSIS — Z3A22 22 weeks gestation of pregnancy: Secondary | ICD-10-CM | POA: Diagnosis not present

## 2024-05-21 DIAGNOSIS — W19XXXA Unspecified fall, initial encounter: Secondary | ICD-10-CM | POA: Diagnosis not present

## 2024-05-21 DIAGNOSIS — D61818 Other pancytopenia: Secondary | ICD-10-CM | POA: Diagnosis not present

## 2024-05-21 DIAGNOSIS — N839 Noninflammatory disorder of ovary, fallopian tube and broad ligament, unspecified: Secondary | ICD-10-CM | POA: Diagnosis not present

## 2024-05-21 LAB — PARASITE EXAM, BLOOD

## 2024-05-21 LAB — COPPER, SERUM: Copper: 278 ug/dL — ABNORMAL HIGH (ref 80–158)

## 2024-05-21 LAB — PARVOVIRUS B19 ANTIBODY, IGG AND IGM
Parovirus B19 IgG Abs: 0.2 {index} (ref 0.0–0.8)
Parovirus B19 IgM Abs: 1.4 {index} — ABNORMAL HIGH (ref 0.0–0.8)

## 2024-05-21 LAB — MISC LABCORP TEST (SEND OUT): Labcorp test code: 138315

## 2024-05-22 DIAGNOSIS — N83292 Other ovarian cyst, left side: Secondary | ICD-10-CM | POA: Diagnosis not present

## 2024-05-22 DIAGNOSIS — Z3A22 22 weeks gestation of pregnancy: Secondary | ICD-10-CM | POA: Diagnosis not present

## 2024-05-22 DIAGNOSIS — O99212 Obesity complicating pregnancy, second trimester: Secondary | ICD-10-CM | POA: Diagnosis not present

## 2024-05-22 DIAGNOSIS — O10012 Pre-existing essential hypertension complicating pregnancy, second trimester: Secondary | ICD-10-CM | POA: Diagnosis not present

## 2024-05-22 LAB — MISC LABCORP TEST (SEND OUT): Labcorp test code: 138318

## 2024-05-23 LAB — HUMAN PARVOVIRUS DNA DETECTION BY PCR: Parvovirus B19, PCR: POSITIVE — AB

## 2024-05-24 LAB — CULTURE, BLOOD (ROUTINE X 2)
Culture: NO GROWTH
Culture: NO GROWTH

## 2024-05-26 ENCOUNTER — Ambulatory Visit: Payer: Self-pay

## 2024-05-26 LAB — MISC LABCORP TEST (SEND OUT): Labcorp test code: 138412

## 2024-05-28 DIAGNOSIS — O98512 Other viral diseases complicating pregnancy, second trimester: Secondary | ICD-10-CM | POA: Diagnosis not present

## 2024-05-28 DIAGNOSIS — Z3A23 23 weeks gestation of pregnancy: Secondary | ICD-10-CM | POA: Diagnosis not present

## 2024-05-28 DIAGNOSIS — Z7189 Other specified counseling: Secondary | ICD-10-CM | POA: Diagnosis not present

## 2024-05-28 DIAGNOSIS — O99012 Anemia complicating pregnancy, second trimester: Secondary | ICD-10-CM | POA: Diagnosis not present

## 2024-05-28 DIAGNOSIS — N9489 Other specified conditions associated with female genital organs and menstrual cycle: Secondary | ICD-10-CM | POA: Diagnosis not present

## 2024-05-28 DIAGNOSIS — Z3A25 25 weeks gestation of pregnancy: Secondary | ICD-10-CM | POA: Diagnosis not present

## 2024-05-28 DIAGNOSIS — B343 Parvovirus infection, unspecified: Secondary | ICD-10-CM | POA: Diagnosis not present

## 2024-05-28 DIAGNOSIS — D61818 Other pancytopenia: Secondary | ICD-10-CM | POA: Diagnosis not present

## 2024-06-04 DIAGNOSIS — O10919 Unspecified pre-existing hypertension complicating pregnancy, unspecified trimester: Secondary | ICD-10-CM | POA: Diagnosis not present

## 2024-06-04 DIAGNOSIS — D61818 Other pancytopenia: Secondary | ICD-10-CM | POA: Diagnosis not present

## 2024-06-04 DIAGNOSIS — N83291 Other ovarian cyst, right side: Secondary | ICD-10-CM | POA: Diagnosis not present

## 2024-06-04 DIAGNOSIS — N9489 Other specified conditions associated with female genital organs and menstrual cycle: Secondary | ICD-10-CM | POA: Diagnosis not present

## 2024-06-04 DIAGNOSIS — Z20828 Contact with and (suspected) exposure to other viral communicable diseases: Secondary | ICD-10-CM | POA: Diagnosis not present

## 2024-06-04 DIAGNOSIS — O98512 Other viral diseases complicating pregnancy, second trimester: Secondary | ICD-10-CM | POA: Diagnosis not present

## 2024-06-04 DIAGNOSIS — R87612 Low grade squamous intraepithelial lesion on cytologic smear of cervix (LGSIL): Secondary | ICD-10-CM | POA: Diagnosis not present

## 2024-06-04 DIAGNOSIS — Z3A24 24 weeks gestation of pregnancy: Secondary | ICD-10-CM | POA: Diagnosis not present

## 2024-06-04 DIAGNOSIS — E049 Nontoxic goiter, unspecified: Secondary | ICD-10-CM | POA: Diagnosis not present

## 2024-06-04 DIAGNOSIS — O0990 Supervision of high risk pregnancy, unspecified, unspecified trimester: Secondary | ICD-10-CM | POA: Diagnosis not present

## 2024-06-04 DIAGNOSIS — B343 Parvovirus infection, unspecified: Secondary | ICD-10-CM | POA: Diagnosis not present

## 2024-06-04 DIAGNOSIS — Z6841 Body Mass Index (BMI) 40.0 and over, adult: Secondary | ICD-10-CM | POA: Diagnosis not present

## 2024-06-04 DIAGNOSIS — Z23 Encounter for immunization: Secondary | ICD-10-CM | POA: Diagnosis not present

## 2024-06-04 DIAGNOSIS — O3492 Maternal care for abnormality of pelvic organ, unspecified, second trimester: Secondary | ICD-10-CM | POA: Diagnosis not present

## 2024-06-04 DIAGNOSIS — O0992 Supervision of high risk pregnancy, unspecified, second trimester: Secondary | ICD-10-CM | POA: Diagnosis not present

## 2024-06-04 DIAGNOSIS — Z1389 Encounter for screening for other disorder: Secondary | ICD-10-CM | POA: Diagnosis not present
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
## 2024-06-09 DIAGNOSIS — N9489 Other specified conditions associated with female genital organs and menstrual cycle: Secondary | ICD-10-CM | POA: Diagnosis not present

## 2024-06-09 DIAGNOSIS — O99212 Obesity complicating pregnancy, second trimester: Secondary | ICD-10-CM | POA: Diagnosis not present

## 2024-06-09 DIAGNOSIS — Z419 Encounter for procedure for purposes other than remedying health state, unspecified: Secondary | ICD-10-CM | POA: Diagnosis not present

## 2024-06-09 DIAGNOSIS — R87612 Low grade squamous intraepithelial lesion on cytologic smear of cervix (LGSIL): Secondary | ICD-10-CM | POA: Diagnosis not present

## 2024-06-09 DIAGNOSIS — O99810 Abnormal glucose complicating pregnancy: Secondary | ICD-10-CM | POA: Diagnosis not present

## 2024-06-09 DIAGNOSIS — Z20828 Contact with and (suspected) exposure to other viral communicable diseases: Secondary | ICD-10-CM | POA: Diagnosis not present

## 2024-06-09 DIAGNOSIS — E049 Nontoxic goiter, unspecified: Secondary | ICD-10-CM | POA: Diagnosis not present

## 2024-06-09 DIAGNOSIS — D61818 Other pancytopenia: Secondary | ICD-10-CM | POA: Diagnosis not present

## 2024-06-09 DIAGNOSIS — Z3A25 25 weeks gestation of pregnancy: Secondary | ICD-10-CM | POA: Diagnosis not present

## 2024-06-16 DIAGNOSIS — O98513 Other viral diseases complicating pregnancy, third trimester: Secondary | ICD-10-CM | POA: Diagnosis not present

## 2024-06-16 DIAGNOSIS — O99213 Obesity complicating pregnancy, third trimester: Secondary | ICD-10-CM | POA: Diagnosis not present

## 2024-06-16 DIAGNOSIS — Z3A28 28 weeks gestation of pregnancy: Secondary | ICD-10-CM | POA: Diagnosis not present

## 2024-06-16 DIAGNOSIS — N83201 Unspecified ovarian cyst, right side: Secondary | ICD-10-CM | POA: Diagnosis not present

## 2024-06-16 DIAGNOSIS — B343 Parvovirus infection, unspecified: Secondary | ICD-10-CM | POA: Diagnosis not present

## 2024-06-16 NOTE — Progress Notes (Addendum)
 [facility]                                                                        Return Prenatal Note  Assessment/Plan:  Plan 33 y.o. [phone] at [redacted]w[redacted]d presents for a follow-up OB visit for high risk pregnancy.   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 Discussed reassuring signs without other significant abnormalities seen, dopplers continue to be within normal limits today. cfDNA low risk this pregnancy. Discussed the possibility of additional fetal genetic testing with amniocentesis today. Patient is interested in further discussion with genetic counselors (scheduled for 8/26). Of note, she reports daughter born with chromosomal abnormality.  - Follow up with genetic counselors 8/26.   Pancytopenia (CMS-HCC) - CBC with Diff at 8/26 appointment  - Continue weekly dopplers, next 8/26  Abnormal glucose tolerance test (GTT) during pregnancy, antepartum (HHS-HCC) - Will do 3h GTT on 8/26     Future Appointments  Date Time Provider Department Center  06/23/2024  8:30 AM [facility] OB NURSE [facility]  06/23/2024  2:00 PM Arlin Sharyne Economy, [facility]  06/23/2024  3:00 PM Esther Curtiss Dragon, [facility]  06/23/2024  4:00 PM [facility] OB US  RM 1 [facility] - [facility] at  07/02/2024  3:30 PM [facility] OB US  RM 2 [facility] - [facility]FM at  07/02/2024  4:20 PM Zulema Dann DEL, MD [facility]     Appointment with genetic counselors at next appointment next week.   Subjective:      33 y.o. [id number] at [redacted]w[redacted]d presents for follow-up OB visit for high risk pregnancy. Patient reports normal fetal movement and denies contractions, leakage of fluid, or vaginal bleeding.  No new concerns or symptoms today.     Objective:     The  following portions of the patient's history were reviewed and updated as appropriate: allergies, current medications, past obstetric history, past family history, past medical history, past social history, past surgical history, and problem list  Flowsheet Vitals: Temp: 36.8 C (98.2 F) Pulse: 104 BP: 132/91 Movement: Present Loss of Fluid: No Bleeding: No Contractions: Not present Total weight gain: Not found.  General Appearance No acute distress, well appearing, and well nourished.  Pulmonary Normal work of breathing.  Cardiovascular Normal rate. Edema 2+.    Gastronintestinal Abdomen soft, gravid, no rebound or guarding.  Genitourinary SVE deferred    Muskuloskeletal Normal gait, grossly normal range of motion  Neurologic Alert and oriented to person, place, and time  Psychiatric Integumentary Mood and affect within normal limits Skin is warm, dry, no rash noted  This encounter was coded based on time. I personally spent 30 minutes face-to-face and non-face-to-face in the care of this patient, which includes all pre, intra, and post visit time on the date of service.

## 2024-06-18 NOTE — Progress Notes (Signed)
 MFM Attending Physician Note I reviewed with the resident the medical history and the resident's findings on physical examination.  I discussed with the resident the patient's diagnosis and concur with the treatment plan as documented in the trainee's note.  I have personally seen and evaluated the patient and agree.  Ventriculomegaly is defined as the atria of the lateral ventricle measuring > 10 mm with more severe prognosis and poorer outcomes with atria measuring > 15 mm. Ventriculomegaly of 10-12 mm usually has a low risk of developmental abnormalities and survival is ~  98%. With severe ventriculomegaly, survival is reduced (30-60%) and the risk of developmental delay is increased (30-40%). The etiologies of ventriculomegaly were reviewed including idiopathic etiologies, chromosomal abnormalities (2-5%) and aqueductal stenosis. Aqueductal stenosis can be associated with developmental conditions (x-linked hydrocephalis - L1CAM mutations), due to infections (CMV, toxo), mass, or intraventricular bleeding. The term hydrocephalus is related to enlarged ventricles due to increased pressure.   The likely etiology of the ventriculomegaly is most likely the maternal parovirus infection. The patient also has a previous child with a chromosomal disorder.  I have discussed the role of genetic counseling with the patient and she will see genetic counseling with her next appointment.  Spoke with Alfonso Louder CGC.      Mancel CHRISTELLA Salt MD [facility] Maternal-Fetal Medicine

## 2024-06-23 DIAGNOSIS — O99213 Obesity complicating pregnancy, third trimester: Secondary | ICD-10-CM | POA: Diagnosis not present

## 2024-06-23 DIAGNOSIS — Z20828 Contact with and (suspected) exposure to other viral communicable diseases: Secondary | ICD-10-CM | POA: Diagnosis not present

## 2024-06-23 DIAGNOSIS — Q9359 Other deletions of part of a chromosome: Secondary | ICD-10-CM | POA: Diagnosis not present

## 2024-06-23 DIAGNOSIS — O0993 Supervision of high risk pregnancy, unspecified, third trimester: Secondary | ICD-10-CM | POA: Diagnosis not present

## 2024-06-23 DIAGNOSIS — O99810 Abnormal glucose complicating pregnancy: Secondary | ICD-10-CM | POA: Diagnosis not present

## 2024-06-23 DIAGNOSIS — Z3A29 29 weeks gestation of pregnancy: Secondary | ICD-10-CM | POA: Diagnosis not present

## 2024-06-23 DIAGNOSIS — O98513 Other viral diseases complicating pregnancy, third trimester: Secondary | ICD-10-CM | POA: Diagnosis not present

## 2024-06-23 DIAGNOSIS — O98512 Other viral diseases complicating pregnancy, second trimester: Secondary | ICD-10-CM | POA: Diagnosis not present

## 2024-06-23 DIAGNOSIS — Z6841 Body Mass Index (BMI) 40.0 and over, adult: Secondary | ICD-10-CM | POA: Diagnosis not present

## 2024-06-23 DIAGNOSIS — D61818 Other pancytopenia: Secondary | ICD-10-CM | POA: Diagnosis not present

## 2024-06-23 DIAGNOSIS — N83201 Unspecified ovarian cyst, right side: Secondary | ICD-10-CM | POA: Diagnosis not present

## 2024-06-23 DIAGNOSIS — D649 Anemia, unspecified: Secondary | ICD-10-CM | POA: Diagnosis not present

## 2024-06-23 DIAGNOSIS — Z315 Encounter for genetic counseling: Secondary | ICD-10-CM | POA: Diagnosis not present

## 2024-06-23 DIAGNOSIS — B343 Parvovirus infection, unspecified: Secondary | ICD-10-CM | POA: Diagnosis not present

## 2024-06-23 DIAGNOSIS — E049 Nontoxic goiter, unspecified: Secondary | ICD-10-CM | POA: Diagnosis not present

## 2024-06-23 DIAGNOSIS — O3509X0 Maternal care for (suspected) other central nervous system malformation or damage in fetus, not applicable or unspecified: Secondary | ICD-10-CM | POA: Diagnosis not present

## 2024-06-29 ENCOUNTER — Other Ambulatory Visit: Payer: Self-pay

## 2024-06-29 ENCOUNTER — Emergency Department

## 2024-06-29 ENCOUNTER — Observation Stay
Admission: EM | Admit: 2024-06-29 | Discharge: 2024-06-29 | Disposition: A | Discharge status: Home / Self Care | Attending: Obstetrics | Admitting: Obstetrics

## 2024-06-29 DIAGNOSIS — O99212 Obesity complicating pregnancy, second trimester: Secondary | ICD-10-CM

## 2024-06-29 DIAGNOSIS — T1490XA Injury, unspecified, initial encounter: Secondary | ICD-10-CM | POA: Diagnosis not present

## 2024-06-29 DIAGNOSIS — O368330 Maternal care for abnormalities of the fetal heart rate or rhythm, third trimester, not applicable or unspecified: Secondary | ICD-10-CM | POA: Diagnosis not present

## 2024-06-29 DIAGNOSIS — R1084 Generalized abdominal pain: Secondary | ICD-10-CM | POA: Diagnosis not present

## 2024-06-29 DIAGNOSIS — Z3A29 29 weeks gestation of pregnancy: Secondary | ICD-10-CM | POA: Diagnosis not present

## 2024-06-29 DIAGNOSIS — R519 Headache, unspecified: Secondary | ICD-10-CM | POA: Insufficient documentation

## 2024-06-29 DIAGNOSIS — O321XX1 Maternal care for breech presentation, fetus 1: Secondary | ICD-10-CM | POA: Diagnosis not present

## 2024-06-29 DIAGNOSIS — O9A113 Malignant neoplasm complicating pregnancy, third trimester: Secondary | ICD-10-CM

## 2024-06-29 DIAGNOSIS — O99513 Diseases of the respiratory system complicating pregnancy, third trimester: Secondary | ICD-10-CM | POA: Diagnosis not present

## 2024-06-29 DIAGNOSIS — J45909 Unspecified asthma, uncomplicated: Secondary | ICD-10-CM | POA: Diagnosis not present

## 2024-06-29 DIAGNOSIS — Z3A30 30 weeks gestation of pregnancy: Secondary | ICD-10-CM | POA: Diagnosis not present

## 2024-06-29 DIAGNOSIS — E041 Nontoxic single thyroid nodule: Secondary | ICD-10-CM | POA: Diagnosis not present

## 2024-06-29 DIAGNOSIS — R1010 Upper abdominal pain, unspecified: Secondary | ICD-10-CM | POA: Diagnosis not present

## 2024-06-29 DIAGNOSIS — R1012 Left upper quadrant pain: Secondary | ICD-10-CM | POA: Insufficient documentation

## 2024-06-29 DIAGNOSIS — Z743 Need for continuous supervision: Secondary | ICD-10-CM | POA: Diagnosis not present

## 2024-06-29 DIAGNOSIS — Y92410 Unspecified street and highway as the place of occurrence of the external cause: Secondary | ICD-10-CM | POA: Insufficient documentation

## 2024-06-29 DIAGNOSIS — O9A213 Injury, poisoning and certain other consequences of external causes complicating pregnancy, third trimester: Principal | ICD-10-CM | POA: Insufficient documentation

## 2024-06-29 DIAGNOSIS — Z7982 Long term (current) use of aspirin: Secondary | ICD-10-CM | POA: Diagnosis not present

## 2024-06-29 DIAGNOSIS — O26893 Other specified pregnancy related conditions, third trimester: Secondary | ICD-10-CM | POA: Diagnosis not present

## 2024-06-29 DIAGNOSIS — Z3689 Encounter for other specified antenatal screening: Secondary | ICD-10-CM

## 2024-06-29 LAB — CBC WITH DIFFERENTIAL/PLATELET
Abs Immature Granulocytes: 0.01 K/uL (ref 0.00–0.07)
Basophils Absolute: 0 K/uL (ref 0.0–0.1)
Basophils Relative: 1 %
Eosinophils Absolute: 0.1 K/uL (ref 0.0–0.5)
Eosinophils Relative: 1 %
HCT: 29.3 % — ABNORMAL LOW (ref 36.0–46.0)
Hemoglobin: 9.5 g/dL — ABNORMAL LOW (ref 12.0–15.0)
Immature Granulocytes: 0 %
Lymphocytes Relative: 23 %
Lymphs Abs: 1 K/uL (ref 0.7–4.0)
MCH: 29.6 pg (ref 26.0–34.0)
MCHC: 32.4 g/dL (ref 30.0–36.0)
MCV: 91.3 fL (ref 80.0–100.0)
Monocytes Absolute: 0.3 K/uL (ref 0.1–1.0)
Monocytes Relative: 8 %
Neutro Abs: 3 K/uL (ref 1.7–7.7)
Neutrophils Relative %: 67 %
Platelets: 153 K/uL (ref 150–400)
RBC: 3.21 MIL/uL — ABNORMAL LOW (ref 3.87–5.11)
RDW: 15.9 % — ABNORMAL HIGH (ref 11.5–15.5)
WBC: 4.4 K/uL (ref 4.0–10.5)
nRBC: 0 % (ref 0.0–0.2)

## 2024-06-29 LAB — COMPREHENSIVE METABOLIC PANEL WITH GFR
ALT: 12 U/L (ref 0–44)
AST: 18 U/L (ref 15–41)
Albumin: 2.6 g/dL — ABNORMAL LOW (ref 3.5–5.0)
Alkaline Phosphatase: 102 U/L (ref 38–126)
Anion gap: 7 (ref 5–15)
BUN: 8 mg/dL (ref 6–20)
CO2: 24 mmol/L (ref 22–32)
Calcium: 8.2 mg/dL — ABNORMAL LOW (ref 8.9–10.3)
Chloride: 105 mmol/L (ref 98–111)
Creatinine, Ser: 0.5 mg/dL (ref 0.44–1.00)
GFR, Estimated: >60 mL/min (ref >60)
Glucose, Bld: 98 mg/dL (ref 70–99)
Potassium: 3.5 mmol/L (ref 3.5–5.1)
Sodium: 136 mmol/L (ref 135–145)
Total Bilirubin: 0.8 mg/dL (ref 0.0–1.2)
Total Protein: 6.1 g/dL — ABNORMAL LOW (ref 6.5–8.1)

## 2024-06-29 LAB — TYPE AND SCREEN
ABO/RH(D): A POS
Antibody Screen: NEGATIVE

## 2024-06-29 LAB — LIPASE, BLOOD: Lipase: 21 U/L (ref 11–51)

## 2024-06-29 MED ORDER — ACETAMINOPHEN 500 MG PO TABS
1000.0000 mg | ORAL_TABLET | Freq: Once | ORAL | Status: AC
  Administered 2024-06-29: 1000 mg via ORAL
  Filled 2024-06-29: qty 2
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
## 2024-06-29 NOTE — Progress Notes (Signed)
 Discharge instructions provided to patient. Patient verbalized understanding. Pt educated on signs and symptoms of labor, vaginal bleeding, LOF, and fetal movement. Red flag signs reviewed by RN. Patient discharged home.
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
## 2024-07-02 DIAGNOSIS — N9489 Other specified conditions associated with female genital organs and menstrual cycle: Secondary | ICD-10-CM | POA: Diagnosis not present

## 2024-07-02 DIAGNOSIS — Z23 Encounter for immunization: Secondary | ICD-10-CM | POA: Diagnosis not present

## 2024-07-02 DIAGNOSIS — O99213 Obesity complicating pregnancy, third trimester: Secondary | ICD-10-CM | POA: Diagnosis not present

## 2024-07-02 DIAGNOSIS — Z3A30 30 weeks gestation of pregnancy: Secondary | ICD-10-CM | POA: Diagnosis not present

## 2024-07-10 ENCOUNTER — Other Ambulatory Visit: Payer: Self-pay

## 2024-07-10 ENCOUNTER — Encounter: Payer: Self-pay | Admitting: Obstetrics and Gynecology

## 2024-07-10 ENCOUNTER — Observation Stay
Admission: EM | Admit: 2024-07-10 | Discharge: 2024-07-10 | Disposition: A | Discharge status: Home / Self Care | Attending: Certified Nurse Midwife | Admitting: Certified Nurse Midwife

## 2024-07-10 DIAGNOSIS — O26893 Other specified pregnancy related conditions, third trimester: Secondary | ICD-10-CM | POA: Diagnosis not present

## 2024-07-10 DIAGNOSIS — R519 Headache, unspecified: Secondary | ICD-10-CM | POA: Diagnosis not present

## 2024-07-10 DIAGNOSIS — Z7982 Long term (current) use of aspirin: Secondary | ICD-10-CM | POA: Insufficient documentation

## 2024-07-10 DIAGNOSIS — Z3A31 31 weeks gestation of pregnancy: Secondary | ICD-10-CM

## 2024-07-10 DIAGNOSIS — Z3689 Encounter for other specified antenatal screening: Principal | ICD-10-CM

## 2024-07-10 DIAGNOSIS — Z419 Encounter for procedure for purposes other than remedying health state, unspecified: Secondary | ICD-10-CM | POA: Diagnosis not present

## 2024-07-10 DIAGNOSIS — O368330 Maternal care for abnormalities of the fetal heart rate or rhythm, third trimester, not applicable or unspecified: Secondary | ICD-10-CM | POA: Diagnosis not present

## 2024-07-10 LAB — CBC WITH DIFFERENTIAL/PLATELET
Abs Immature Granulocytes: 0.01 K/uL (ref 0.00–0.07)
Basophils Absolute: 0 K/uL (ref 0.0–0.1)
Basophils Relative: 0 %
Eosinophils Absolute: 0.1 K/uL (ref 0.0–0.5)
Eosinophils Relative: 2 %
HCT: 28.5 % — ABNORMAL LOW (ref 36.0–46.0)
Hemoglobin: 9.5 g/dL — ABNORMAL LOW (ref 12.0–15.0)
Immature Granulocytes: 0 %
Lymphocytes Relative: 25 %
Lymphs Abs: 1.1 K/uL (ref 0.7–4.0)
MCH: 29.3 pg (ref 26.0–34.0)
MCHC: 33.3 g/dL (ref 30.0–36.0)
MCV: 88 fL (ref 80.0–100.0)
Monocytes Absolute: 0.4 K/uL (ref 0.1–1.0)
Monocytes Relative: 9 %
Neutro Abs: 2.8 K/uL (ref 1.7–7.7)
Neutrophils Relative %: 64 %
Platelets: 153 K/uL (ref 150–400)
RBC: 3.24 MIL/uL — ABNORMAL LOW (ref 3.87–5.11)
RDW: 14.9 % (ref 11.5–15.5)
WBC: 4.3 K/uL (ref 4.0–10.5)
nRBC: 0 % (ref 0.0–0.2)

## 2024-07-10 LAB — COMPREHENSIVE METABOLIC PANEL WITH GFR
ALT: 14 U/L (ref 0–44)
AST: 17 U/L (ref 15–41)
Albumin: 2.6 g/dL — ABNORMAL LOW (ref 3.5–5.0)
Alkaline Phosphatase: 104 U/L (ref 38–126)
Anion gap: 9 (ref 5–15)
BUN: 8 mg/dL (ref 6–20)
CO2: 22 mmol/L (ref 22–32)
Calcium: 8 mg/dL — ABNORMAL LOW (ref 8.9–10.3)
Chloride: 106 mmol/L (ref 98–111)
Creatinine, Ser: 0.65 mg/dL (ref 0.44–1.00)
GFR, Estimated: >60 mL/min (ref >60)
Glucose, Bld: 82 mg/dL (ref 70–99)
Potassium: 3.6 mmol/L (ref 3.5–5.1)
Sodium: 137 mmol/L (ref 135–145)
Total Bilirubin: 1 mg/dL (ref 0.0–1.2)
Total Protein: 5.8 g/dL — ABNORMAL LOW (ref 6.5–8.1)

## 2024-07-10 LAB — PROTEIN / CREATININE RATIO, URINE
Creatinine, Urine: 143 mg/dL
Protein Creatinine Ratio: 0.24 mg/mg{creat} — ABNORMAL HIGH (ref 0.00–0.15)
Total Protein, Urine: 35 mg/dL

## 2024-07-10 MED ORDER — BUTALBITAL-APAP-CAFFEINE 50-325-40 MG PO TABS
2.0000 | ORAL_TABLET | ORAL | Status: DC | PRN
  Administered 2024-07-10: 2 via ORAL
  Filled 2024-07-10: qty 2

## 2024-07-10 MED ORDER — ONDANSETRON 4 MG PO TBDP
4.0000 mg | ORAL_TABLET | Freq: Four times a day (QID) | ORAL | Status: DC | PRN
  Administered 2024-07-10: 4 mg via ORAL
  Filled 2024-07-10: qty 1

## 2024-07-10 NOTE — OB Triage Note (Signed)
 Pt co not feeling well, co of HA, unrelieved by Tylenol . Denies blurred vision, spots, or epigastric pain. Pt stated symptoms started around 0700 this am. Pt also reports nausea and vomiting. Reports elevated BP taken at [facility] earlier in the day. Pt also complaining of pelvic pain. Denies vaginal bleeding or LOF. Marijo Merribeth RAMAN

## 2024-07-10 NOTE — OB Triage Provider Note (Signed)
   L&D OB Triage Note  SUBJECTIVE Bianca Hayes is a 33 y.o. [id number] female at [redacted]w[redacted]d, EDD Estimated Date of Delivery: 09/08/24 who presented to triage with complaints of  headache  that was unrelieved with tylenol . She notes she had nausea and vomiting earlier and that she took her BP at [facility] and it was elevated.  She denies visual changes , epigastric pain, contractions, loss of fluid, and vaginal bleeding. She is feeling good fetal movement.   OB History  Gravida Para Term Preterm AB Living  6 3 3  0 2 3  SAB IAB Ectopic Multiple Live Births  2 0 0 0 3    # Outcome Date GA Lbr Len/2nd Weight Sex Type Anes PTL Lv  6 Current           5 SAB 2022          4 SAB 2020          3 Term 07/17/14    F Vag-Spont   LIV     Name: Irvona  2 Term 11/18/10    F Vag-Spont   LIV  1 Term 12/05/09    F Vag-Spont   LIV    Medications Prior to Admission  Medication Sig Dispense Refill Last Dose/Taking   aspirin  EC 81 MG tablet Take 2 tablets (162 mg total) by mouth daily. Swallow whole. Start at [redacted] weeks pregnant 60 tablet 12 07/09/2024   Prenatal Vit-Fe Fumarate-FA (PRENATAL PO) Take by mouth daily.   07/10/2024     OBJECTIVE  Nursing Evaluation:   BP 112/61   Pulse 86   Temp 98.2 F (36.8 C) (Oral)   Resp 20   Ht 5' 9 (1.753 m)   Wt (!) 172.4 kg   LMP 12/14/2023   SpO2 95%   BMI 56.12 kg/m    Findings: labs negative for pre eclampsia.         NST was performed and has been reviewed by me.  NST INTERPRETATION: Category I  Mode: External Baseline Rate (A): 135 bpm (fetal hiccups noted) Variability: Moderate Accelerations: 15 x 15 Decelerations: None     Contraction Frequency (min): none  ASSESSMENT Impression:  1.  Pregnancy:  [phone] at [redacted]w[redacted]d , EDD Estimated Date of Delivery: 09/08/24 2.  Reassuring fetal and maternal status 3.  Headache resolved with Fioricet   4.Normal reflexes , no clonus per RN exam .  PLAN 1. Current condition and above findings reviewed.   Reassuring fetal and maternal condition. 2. Discharge home with standard labor precautions given to return to L&D or call the office for problems. 3. Continue routine prenatal care.  Follow up as scheduled with [facility].   Zelda Hummer, CNM

## 2024-07-14 DIAGNOSIS — Z3A32 32 weeks gestation of pregnancy: Secondary | ICD-10-CM | POA: Diagnosis not present

## 2024-07-14 DIAGNOSIS — O10013 Pre-existing essential hypertension complicating pregnancy, third trimester: Secondary | ICD-10-CM | POA: Diagnosis not present

## 2024-07-14 DIAGNOSIS — O99213 Obesity complicating pregnancy, third trimester: Secondary | ICD-10-CM | POA: Diagnosis not present

## 2024-07-14 DIAGNOSIS — O10913 Unspecified pre-existing hypertension complicating pregnancy, third trimester: Secondary | ICD-10-CM | POA: Diagnosis not present

## 2024-07-14 DIAGNOSIS — R079 Chest pain, unspecified: Secondary | ICD-10-CM | POA: Diagnosis not present

## 2024-07-14 DIAGNOSIS — O99891 Other specified diseases and conditions complicating pregnancy: Secondary | ICD-10-CM | POA: Diagnosis not present

## 2024-07-15 DIAGNOSIS — O0993 Supervision of high risk pregnancy, unspecified, third trimester: Secondary | ICD-10-CM | POA: Diagnosis not present

## 2024-07-21 DIAGNOSIS — Z20828 Contact with and (suspected) exposure to other viral communicable diseases: Secondary | ICD-10-CM | POA: Diagnosis not present

## 2024-07-28 DIAGNOSIS — Z20828 Contact with and (suspected) exposure to other viral communicable diseases: Secondary | ICD-10-CM | POA: Diagnosis not present

## 2024-07-28 DIAGNOSIS — Z7689 Persons encountering health services in other specified circumstances: Secondary | ICD-10-CM | POA: Diagnosis not present

## 2024-07-28 DIAGNOSIS — O3509X0 Maternal care for (suspected) other central nervous system malformation or damage in fetus, not applicable or unspecified: Secondary | ICD-10-CM | POA: Diagnosis not present

## 2024-07-28 DIAGNOSIS — Z23 Encounter for immunization: Secondary | ICD-10-CM | POA: Diagnosis not present

## 2024-07-28 DIAGNOSIS — Z3A34 34 weeks gestation of pregnancy: Secondary | ICD-10-CM | POA: Diagnosis not present

## 2024-07-28 DIAGNOSIS — Z6841 Body Mass Index (BMI) 40.0 and over, adult: Secondary | ICD-10-CM | POA: Diagnosis not present

## 2024-07-28 DIAGNOSIS — O3509X1 Maternal care for (suspected) other central nervous system malformation or damage in fetus, fetus 1: Secondary | ICD-10-CM | POA: Diagnosis not present

## 2024-07-31 DIAGNOSIS — O99810 Abnormal glucose complicating pregnancy: Secondary | ICD-10-CM | POA: Diagnosis not present

## 2024-07-31 DIAGNOSIS — Z3A34 34 weeks gestation of pregnancy: Secondary | ICD-10-CM | POA: Diagnosis not present

## 2024-08-07 DIAGNOSIS — O10013 Pre-existing essential hypertension complicating pregnancy, third trimester: Secondary | ICD-10-CM | POA: Diagnosis not present

## 2024-08-07 DIAGNOSIS — Z3A35 35 weeks gestation of pregnancy: Secondary | ICD-10-CM | POA: Diagnosis not present

## 2024-08-07 DIAGNOSIS — N9489 Other specified conditions associated with female genital organs and menstrual cycle: Secondary | ICD-10-CM | POA: Diagnosis not present

## 2024-08-14 DIAGNOSIS — N9489 Other specified conditions associated with female genital organs and menstrual cycle: Secondary | ICD-10-CM | POA: Diagnosis not present

## 2024-08-14 DIAGNOSIS — O10919 Unspecified pre-existing hypertension complicating pregnancy, unspecified trimester: Secondary | ICD-10-CM | POA: Diagnosis not present

## 2024-08-14 DIAGNOSIS — O0993 Supervision of high risk pregnancy, unspecified, third trimester: Secondary | ICD-10-CM | POA: Diagnosis not present

## 2024-08-17 DIAGNOSIS — Z3A36 36 weeks gestation of pregnancy: Secondary | ICD-10-CM | POA: Diagnosis not present

## 2024-08-17 DIAGNOSIS — O479 False labor, unspecified: Secondary | ICD-10-CM | POA: Diagnosis not present

## 2024-08-18 DIAGNOSIS — Z3A37 37 weeks gestation of pregnancy: Secondary | ICD-10-CM | POA: Diagnosis not present

## 2024-08-18 DIAGNOSIS — O10913 Unspecified pre-existing hypertension complicating pregnancy, third trimester: Secondary | ICD-10-CM | POA: Diagnosis not present

## 2024-08-21 DIAGNOSIS — O10919 Unspecified pre-existing hypertension complicating pregnancy, unspecified trimester: Secondary | ICD-10-CM | POA: Diagnosis not present

## 2024-08-21 DIAGNOSIS — N9489 Other specified conditions associated with female genital organs and menstrual cycle: Secondary | ICD-10-CM | POA: Diagnosis not present

## 2024-08-21 DIAGNOSIS — O288 Other abnormal findings on antenatal screening of mother: Secondary | ICD-10-CM | POA: Diagnosis not present

## 2024-08-21 DIAGNOSIS — R600 Localized edema: Secondary | ICD-10-CM | POA: Diagnosis not present

## 2024-08-21 DIAGNOSIS — O0993 Supervision of high risk pregnancy, unspecified, third trimester: Secondary | ICD-10-CM | POA: Diagnosis not present

## 2024-08-21 DIAGNOSIS — Z3A37 37 weeks gestation of pregnancy: Secondary | ICD-10-CM | POA: Diagnosis not present

## 2024-08-22 DIAGNOSIS — E66813 Obesity, class 3: Secondary | ICD-10-CM | POA: Diagnosis not present

## 2024-08-22 DIAGNOSIS — Z3A37 37 weeks gestation of pregnancy: Secondary | ICD-10-CM | POA: Diagnosis not present

## 2024-08-22 DIAGNOSIS — Q9359 Other deletions of part of a chromosome: Secondary | ICD-10-CM | POA: Diagnosis not present

## 2024-08-22 DIAGNOSIS — O10013 Pre-existing essential hypertension complicating pregnancy, third trimester: Secondary | ICD-10-CM | POA: Diagnosis not present

## 2024-08-22 DIAGNOSIS — O3510X0 Maternal care for (suspected) chromosomal abnormality in fetus, unspecified, not applicable or unspecified: Secondary | ICD-10-CM | POA: Diagnosis not present

## 2024-08-22 DIAGNOSIS — O3483 Maternal care for other abnormalities of pelvic organs, third trimester: Secondary | ICD-10-CM | POA: Diagnosis not present

## 2024-08-22 DIAGNOSIS — O99283 Endocrine, nutritional and metabolic diseases complicating pregnancy, third trimester: Secondary | ICD-10-CM | POA: Diagnosis not present

## 2024-08-22 DIAGNOSIS — O99213 Obesity complicating pregnancy, third trimester: Secondary | ICD-10-CM | POA: Diagnosis not present

## 2024-08-22 DIAGNOSIS — E042 Nontoxic multinodular goiter: Secondary | ICD-10-CM | POA: Diagnosis not present

## 2024-08-23 DIAGNOSIS — O99892 Other specified diseases and conditions complicating childbirth: Secondary | ICD-10-CM | POA: Diagnosis not present

## 2024-08-23 DIAGNOSIS — Z3A37 37 weeks gestation of pregnancy: Secondary | ICD-10-CM | POA: Diagnosis not present

## 2024-08-23 DIAGNOSIS — O1002 Pre-existing essential hypertension complicating childbirth: Secondary | ICD-10-CM | POA: Diagnosis not present

## 2024-08-23 DIAGNOSIS — O352XX0 Maternal care for (suspected) hereditary disease in fetus, not applicable or unspecified: Secondary | ICD-10-CM | POA: Diagnosis not present

## 2024-08-23 DIAGNOSIS — N83291 Other ovarian cyst, right side: Secondary | ICD-10-CM | POA: Diagnosis not present

## 2024-09-03 DIAGNOSIS — K219 Gastro-esophageal reflux disease without esophagitis: Secondary | ICD-10-CM | POA: Diagnosis not present

## 2024-09-03 DIAGNOSIS — F32A Depression, unspecified: Secondary | ICD-10-CM | POA: Diagnosis not present

## 2024-09-03 DIAGNOSIS — K649 Unspecified hemorrhoids: Secondary | ICD-10-CM | POA: Diagnosis not present

## 2024-09-15 DIAGNOSIS — Z1389 Encounter for screening for other disorder: Secondary | ICD-10-CM | POA: Diagnosis not present

## 2024-10-12 DIAGNOSIS — Z30017 Encounter for initial prescription of implantable subdermal contraceptive: Secondary | ICD-10-CM | POA: Diagnosis not present

## 2024-10-12 DIAGNOSIS — Z1332 Encounter for screening for maternal depression: Secondary | ICD-10-CM | POA: Diagnosis not present
